# Patient Record
Sex: Male | Born: 1990
Health system: Southern US, Community
[De-identification: ages and names within clinical notes are randomized; demographics above are authoritative.]

## PROBLEM LIST (undated history)

## (undated) DIAGNOSIS — F32A Depression, unspecified: Secondary | ICD-10-CM

## (undated) DIAGNOSIS — F329 Major depressive disorder, single episode, unspecified: Secondary | ICD-10-CM

## (undated) DIAGNOSIS — F988 Other specified behavioral and emotional disorders with onset usually occurring in childhood and adolescence: Secondary | ICD-10-CM

## (undated) DIAGNOSIS — R066 Hiccough: Secondary | ICD-10-CM

## (undated) DIAGNOSIS — K5792 Diverticulitis of intestine, part unspecified, without perforation or abscess without bleeding: Secondary | ICD-10-CM

## (undated) DIAGNOSIS — F419 Anxiety disorder, unspecified: Secondary | ICD-10-CM

## (undated) DIAGNOSIS — T401X1A Poisoning by heroin, accidental (unintentional), initial encounter: Secondary | ICD-10-CM

## (undated) HISTORY — PX: OTHER SURGICAL HISTORY: SHX169

## (undated) HISTORY — DX: Poisoning by heroin, accidental (unintentional), initial encounter: T40.1X1A

---

## 2009-01-30 ENCOUNTER — Encounter: Payer: Self-pay | Admitting: Internal Medicine

## 2009-02-05 ENCOUNTER — Encounter: Payer: Self-pay | Admitting: Internal Medicine

## 2009-02-05 ENCOUNTER — Telehealth: Payer: Self-pay | Admitting: Internal Medicine

## 2009-02-09 ENCOUNTER — Ambulatory Visit: Payer: Self-pay | Admitting: Internal Medicine

## 2009-02-09 DIAGNOSIS — R198 Other specified symptoms and signs involving the digestive system and abdomen: Secondary | ICD-10-CM | POA: Insufficient documentation

## 2009-02-09 DIAGNOSIS — R112 Nausea with vomiting, unspecified: Secondary | ICD-10-CM | POA: Insufficient documentation

## 2011-02-26 ENCOUNTER — Ambulatory Visit (HOSPITAL_COMMUNITY)
Admission: RE | Admit: 2011-02-26 | Discharge: 2011-02-26 | Disposition: A | Discharge status: Home / Self Care | Payer: BC Managed Care – PPO | Source: Ambulatory Visit | Attending: Family Medicine | Admitting: Family Medicine

## 2011-02-26 ENCOUNTER — Inpatient Hospital Stay (HOSPITAL_COMMUNITY)
Admission: EM | Admit: 2011-02-26 | Discharge: 2011-03-02 | DRG: 183 | Disposition: A | Discharge status: Home / Self Care | Payer: BC Managed Care – PPO | Attending: Internal Medicine | Admitting: Internal Medicine

## 2011-02-26 ENCOUNTER — Other Ambulatory Visit: Payer: Self-pay | Admitting: Family Medicine

## 2011-02-26 DIAGNOSIS — F909 Attention-deficit hyperactivity disorder, unspecified type: Secondary | ICD-10-CM | POA: Diagnosis present

## 2011-02-26 DIAGNOSIS — R109 Unspecified abdominal pain: Secondary | ICD-10-CM | POA: Diagnosis present

## 2011-02-26 DIAGNOSIS — R52 Pain, unspecified: Secondary | ICD-10-CM

## 2011-02-26 DIAGNOSIS — K5732 Diverticulitis of large intestine without perforation or abscess without bleeding: Principal | ICD-10-CM | POA: Diagnosis present

## 2011-02-26 DIAGNOSIS — K7689 Other specified diseases of liver: Secondary | ICD-10-CM | POA: Insufficient documentation

## 2011-02-26 DIAGNOSIS — R509 Fever, unspecified: Secondary | ICD-10-CM | POA: Diagnosis present

## 2011-02-26 DIAGNOSIS — R110 Nausea: Secondary | ICD-10-CM | POA: Insufficient documentation

## 2011-02-26 DIAGNOSIS — N200 Calculus of kidney: Secondary | ICD-10-CM | POA: Insufficient documentation

## 2011-02-26 LAB — URINALYSIS, ROUTINE W REFLEX MICROSCOPIC
Glucose, UA: NEGATIVE mg/dL
Leukocytes, UA: NEGATIVE
Nitrite: NEGATIVE
pH: 7 (ref 5.0–8.0)

## 2011-02-26 LAB — CBC
MCH: 30.5 pg (ref 26.0–34.0)
MCV: 86.8 fL (ref 78.0–100.0)
Platelets: 198 10*3/uL (ref 150–400)
RDW: 13.6 % (ref 11.5–15.5)

## 2011-02-26 LAB — BASIC METABOLIC PANEL
BUN: 9 mg/dL (ref 6–23)
CO2: 28 mEq/L (ref 19–32)
Calcium: 9.4 mg/dL (ref 8.4–10.5)
Creatinine, Ser: 0.72 mg/dL (ref 0.50–1.35)
GFR calc Af Amer: >90 mL/min (ref >90)

## 2011-02-26 LAB — DIFFERENTIAL
Basophils Absolute: 0 10*3/uL (ref 0.0–0.1)
Eosinophils Absolute: 0 10*3/uL (ref 0.0–0.7)
Lymphocytes Relative: 13 % (ref 12–46)
Lymphs Abs: 1.5 10*3/uL (ref 0.7–4.0)
Neutrophils Relative %: 78 % — ABNORMAL HIGH (ref 43–77)

## 2011-02-26 MED ORDER — IOHEXOL 300 MG/ML  SOLN
125.0000 mL | Freq: Once | INTRAMUSCULAR | Status: AC | PRN
  Administered 2011-02-26: 125 mL via INTRAVENOUS

## 2011-02-27 LAB — CBC
MCHC: 34.2 g/dL (ref 30.0–36.0)
MCV: 88.5 fL (ref 78.0–100.0)
Platelets: 181 10*3/uL (ref 150–400)
RDW: 13.5 % (ref 11.5–15.5)
WBC: 8.2 10*3/uL (ref 4.0–10.5)

## 2011-02-27 LAB — BASIC METABOLIC PANEL
Chloride: 100 mEq/L (ref 96–112)
Creatinine, Ser: 0.76 mg/dL (ref 0.50–1.35)
GFR calc Af Amer: >90 mL/min (ref >90)
GFR calc non Af Amer: >90 mL/min (ref >90)

## 2011-02-28 LAB — COMPREHENSIVE METABOLIC PANEL
ALT: 18 U/L (ref 0–53)
AST: 14 U/L (ref 0–37)
Albumin: 3.4 g/dL — ABNORMAL LOW (ref 3.5–5.2)
CO2: 28 mEq/L (ref 19–32)
Chloride: 99 mEq/L (ref 96–112)
GFR calc non Af Amer: >90 mL/min (ref >90)
Potassium: 3.6 mEq/L (ref 3.5–5.1)
Sodium: 136 mEq/L (ref 135–145)
Total Bilirubin: 0.7 mg/dL (ref 0.3–1.2)

## 2011-02-28 LAB — LIPASE, BLOOD: Lipase: 17 U/L (ref 11–59)

## 2011-02-28 LAB — DIFFERENTIAL
Basophils Absolute: 0 10*3/uL (ref 0.0–0.1)
Basophils Relative: 0 % (ref 0–1)
Eosinophils Absolute: 0.1 10*3/uL (ref 0.0–0.7)
Eosinophils Relative: 2 % (ref 0–5)
Lymphs Abs: 1.5 10*3/uL (ref 0.7–4.0)
Neutrophils Relative %: 64 % (ref 43–77)

## 2011-02-28 LAB — CBC
Platelets: 210 10*3/uL (ref 150–400)
RBC: 4.8 MIL/uL (ref 4.22–5.81)
RDW: 13.4 % (ref 11.5–15.5)
WBC: 6.4 10*3/uL (ref 4.0–10.5)

## 2011-03-01 LAB — COMPREHENSIVE METABOLIC PANEL
Alkaline Phosphatase: 67 U/L (ref 39–117)
BUN: 7 mg/dL (ref 6–23)
CO2: 26 mEq/L (ref 19–32)
Calcium: 9.7 mg/dL (ref 8.4–10.5)
Chloride: 100 mEq/L (ref 96–112)
Creatinine, Ser: 0.77 mg/dL (ref 0.50–1.35)
GFR calc Af Amer: >90 mL/min (ref >90)

## 2011-03-01 LAB — DIFFERENTIAL
Eosinophils Absolute: 0.1 10*3/uL (ref 0.0–0.7)
Eosinophils Relative: 3 % (ref 0–5)
Lymphocytes Relative: 27 % (ref 12–46)
Lymphs Abs: 1.4 10*3/uL (ref 0.7–4.0)
Monocytes Relative: 10 % (ref 3–12)

## 2011-03-01 LAB — CBC
HCT: 42.3 % (ref 39.0–52.0)
MCH: 29.8 pg (ref 26.0–34.0)
MCV: 87.4 fL (ref 78.0–100.0)
Platelets: 259 10*3/uL (ref 150–400)
RBC: 4.84 MIL/uL (ref 4.22–5.81)
RDW: 13.1 % (ref 11.5–15.5)
WBC: 5.2 10*3/uL (ref 4.0–10.5)

## 2011-03-02 NOTE — Discharge Summary (Signed)
NAMEHASKELL, [name] NO.:  [id number]  MEDICAL RECORD NO.:  [id number]  LOCATION:  1526                         FACILITY:  [facility]  PHYSICIAN:  Talmage Nap, MD  DATE OF BIRTH:  [birth year]  DATE OF ADMISSION:  02/26/2011 DATE OF DISCHARGE:  03/02/2011                        DISCHARGE SUMMARY - REFERRING   PRIMARY CARE PHYSICIAN:  Kari Baars, M.D.  DISCHARGE DIAGNOSES: 1. Proximal descending colon diverticulitis. 2. Attention deficit hyperactivity disorder.  HISTORY:  The patient is a 20 year old very pleasant Caucasian male with history of ADHD, was admitted to the hospital on February 26, 2011 by Dr. Trula Ore Rama with abdominal pain mainly located in the left lower quadrant as well as in the left flank.  There was no associated nausea, vomiting, or diarrhea.  The patient was said to have had low-grade fever and chills.  Pain was said to have persisted and subsequently presented to the hospital to be admitted.  MEDICATIONS:  His preadmission medications include Adderall 30 mg p.o. daily.  ALLERGIES:  KEFLEX.  PAST SURGICAL HISTORY:  None.  FAMILY HISTORY:  Parents are alive.  Maternal aunt is said to have a history of diverticulosis.  The patient is single and lives with his parents.  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  REVIEW OF SYSTEMS:  Essentially documented in the initial history and physical.  PHYSICAL EXAMINATION:  GENERAL:  At the time the patient was seen by the admitting physician, he was not in any distress, obese. VITAL SIGNS:  Temperature is 98.4, pulse 91, respiratory rate 17, blood pressure is 132/67, and saturation 98% on room air. HEENT:  Pupils are reactive to light and extraocular muscles are intact. He was said to have slight coating of the tongue. NECK:  No jugular venous distention.  No carotid bruit.  No lymphadenopathy. CHEST:  Clear to auscultation with diminished breath sounds bibasilarly. No  adventitious sounds. HEART:  Sounds are 1 and 2. ABDOMEN:  Soft with tenderness in the left lower quadrant.  No guarding, no rigidity.  Liver, spleen, and kidney not palpable.  Bowel sounds are hypoactive. EXTREMITIES:  No pedal edema. NEUROLOGIC:  Nonfocal. MUSCULOSKELETAL SYSTEM:  Unremarkable. SKIN:  Showed decreased turgor.  LABORATORY DATA:  Urinalysis done on the patient, unremarkable. Complete blood count with differential showed WBC of 13.6, hemoglobin of 14.3, hematocrit of 40.7, MCV of 86.8, platelet count of 198, neutrophils 78%, and absolute neutrophil count is 8.8.  Basic metabolic panel showed sodium of 134, potassium of 3.6, chloride of 96 with a bicarbonate of 28, glucose is 84, BUN is 9, creatinine 0.72, and lipase 17.  A repeat complete blood count with differential done on March 01, 2011, showed WBC of 5.2, hemoglobin of 14.4, hematocrit of 42.3, MCV of 87.4, platelet count of 259, and normal differential.  Comprehensive metabolic panel showed sodium of 136, potassium of 3.8, chloride of 100 with a bicarbonate of 26, glucose is 90, BUN is 7, creatinine is 0.77, lipase is 20, and magnesium level is 2.2.  IMAGING STUDIES:  CT of the abdomen and pelvis with contrast and it showed segmental wall thickening of the proximal descending colon with significant pericolic inflammatory changes  and a few foci of extraluminal gas that representing perforated diverticulitis or potentially segmental colitis with perforation.  There is no abscess or intraperitoneal air seen.  HOSPITAL COURSE:  The patient was admitted to general medical floor.  He was made n.p.o., started on normal saline with 20 mEq of KCl to go at rate of 100 cc an hour, and SCD boot for DVT prophylaxis.  He was given Zofran for nausea and Mylanta for dyspepsia.  Pain control was done with oxycodone as well as morphine IV 2 mg to 4 mg q.4 h. p.r.n.  The patient was however seen by me for the very first time  in this admission on February 27, 2011, and during my encounter, the patient was made completely n.p.o. except ice chips.  He was given D5 half-normal saline IV to go at rate of 100 cc an hour and p.o. Flagyl and he was changed from p.o. Flagyl to IV Flagyl 500 mg q.8 h. and also Cipro 400 mg IV q.12 h.  He was subsequently evaluated on daily basis by Dr. Vassie Loll.  The patient was seen by me again today, which is March 02, 2011, feels better.  No abdominal pain.  No nausea or vomiting.  No fever.  No chills.  No rigor.  Tolerating p.o. feeds.  Examination of the patient was essentially unremarkable.  Vital signs, blood pressure is 119/76, temperature is 97.5 pulse 60, respiratory rate 20, and medically stable.  Plan is for the patient to be discharged home today and activity as tolerated, high-fiber diet.  Follow up with his primary care physician in 1 to 2 weeks and PCP to arrange for colonoscopy in 4 weeks.  Medications to be taken at home will include, 1. Cipro 500 mg p.o. b.i.d. for 7 days. 2. Flagyl 500 mg p.o. t.i.d. for 7 days. 3. He is to continue with his Adderall XR 30 mg p.o. daily.     Talmage Nap, MD     CN/MEDQ  D:  03/02/2011  T:  03/02/2011  Job:  [id number]  cc:   Kari Baars, M.D. Fax: [phone]  Electronically Signed by Talmage Nap  on 03/02/2011 06:58:12 PM

## 2011-03-09 NOTE — H&P (Addendum)
[name], [name] NO.:  [id number]  MEDICAL RECORD NO.:  [id number]  LOCATION:  XRAY                         FACILITY:  [facility]  PHYSICIAN:  Hillery Aldo, M.D.   DATE OF BIRTH:  Dec 08, 1990  DATE OF ADMISSION:  02/26/2011 DATE OF DISCHARGE:                             HISTORY & PHYSICAL   PRIMARY CARE PHYSICIAN:  Dr. Buren Kos  CHIEF COMPLAINT:  Abdominal pain.  HISTORY OF PRESENT ILLNESS:  The patient is a 20 year old male with no significant past medical history, who presents to the hospital with a chief complaint of worsening left lower quadrant and left flank pain. There has not been any associated nausea, vomiting, or diarrhea.  He has had a diminished appetite today, but otherwise his appetite has been normal.  [facility], where he underwent a CT scan of his abdomen and pelvis and this showed colitis with a microperforation.  He subsequently was instructed to come to the emergency department for further evaluation.  He was given a dose of Cipro and Flagyl, and ultimately referred to the hospitalist service for further evaluation.  The patient has had some low-grade fever and chills today.  His last bowel movement was normal today and was without melena or hematochezia.  PAST MEDICAL HISTORY:  ADHD.  PAST SURGICAL HISTORY:  None.  FAMILY HISTORY:  Parents are alive in their 49s and healthy.  The patient's maternal aunt might have had a history of diverticulosis, but this is not ascertained.  SOCIAL HISTORY:  The patient is single and lives with his parents.  He is a nonsmoker and does not drink alcohol or use drugs.  He works for an Production designer, theatre/television/film.  ALLERGIES:  KEFLEX.  CURRENT MEDICATIONS:  Adderall 30 mg XR p.o. daily.  REVIEW OF SYSTEMS:  Comprehensive 14-point review of systems is unremarkable except as noted in the elements of the HPI above.  The only pertinent positive was he does complain of some  shortness of breath that is secondary to inability to inspire deeply because of abdominal pain.  PHYSICAL EXAMINATION:  GENERAL:  Mildly obese male who is in no acute distress. VITAL SIGNS:  Temperature 98.4, pulse 91, respirations 17, blood pressure 132/67, and O2 saturation 98% on room air. HEENT:  Normocephalic, atraumatic.  PERRL.  EOMI.  Oropharynx is clear with a slight white coating to the tongue. NECK:  Supple, no thyromegaly, no lymphadenopathy, no jugular venous distention. CHEST:  Lungs, clear to auscultation bilaterally, diminished at the bases. HEART:  Regular rate, rhythm.  No murmurs, rubs, or gallops. ABDOMEN:  Soft.  Tender to the left lower quadrant.  Diminished bowel sounds.  No guarding. EXTREMITIES:  No clubbing, edema, or cyanosis. SKIN:  Warm and dry.  No rashes. NEUROLOGIC:  The patient is alert and oriented x3.  Cranial nerves II through XII are grossly intact.  Nonfocal.  IMAGING:  CT scan of the abdomen and pelvis shows segmental wall thickening of the proximal descending colon with significant pericolic inflammatory changes as well as a few foci of extraluminal gas, either representing perforated diverticulitis or potentially segmental colitis with perforation.  No evidence of abscess or free intraperitoneal air/fluid.  Tiny bilateral nonobstructing renal calculi.  LABORATORY DATA:  Urinalysis is negative for nitrites and leukocytes with a specific gravity of 1.046.  White blood cell count is 11.3, hemoglobin 14.3, hematocrit 40.7, platelets 198 with an absolute neutrophil count of 8.8.  Sodium is 134, potassium 3.6, chloride 96, bicarbonate 28, BUN 9, creatinine 0.72, glucose 84, calcium 9.4.  ASSESSMENT AND PLAN: 1. Colitis with microperforation secondary to diverticulitis versus     segmental colitis:  We will admit the patient and initiate IV     antibiotics with Cipro and Flagyl.  The ER physician did speak with     Dr. Luretha Murphy of Surgery  who recommended admission to the     medical service with IV antibiotics as there was no immediate need     for surgical intervention.  We will place the patient on a clear     liquid diet for now and provide him with analgesics and antiemetics     as needed. 2. Nonobstructing renal calculi:  The patient's urine specific gravity     is concentrated suggesting he dehydrated.  We would hydrate him. 3. Mild hyponatremia:  Again, likely secondary to dehydration.  We     will hydrate him. 4. History of attention deficit hyperactivity disorder.  We will hold     the patient's Adderall for now. 5. Prophylaxis:  We will initiate PAS hoses for deep vein thrombosis     prophylaxis.  Time spent on admission including face-to-face time equals approximately 45 minutes.     Hillery Aldo, M.D.     CR/MEDQ  D:  02/26/2011  T:  02/26/2011  Job:  [id number]  cc:   Dr. Buren Kos  Electronically Signed by Hillery Aldo M.D. on 03/09/2011 06:09:26 PM

## 2013-08-22 ENCOUNTER — Encounter (HOSPITAL_COMMUNITY): Payer: Self-pay | Admitting: Emergency Medicine

## 2013-08-22 ENCOUNTER — Emergency Department (HOSPITAL_COMMUNITY)
Admission: EM | Admit: 2013-08-22 | Discharge: 2013-08-23 | Disposition: A | Discharge status: Home / Self Care | Payer: 59 | Attending: Emergency Medicine | Admitting: Emergency Medicine

## 2013-08-22 ENCOUNTER — Emergency Department (HOSPITAL_COMMUNITY): Payer: 59

## 2013-08-22 DIAGNOSIS — K5732 Diverticulitis of large intestine without perforation or abscess without bleeding: Secondary | ICD-10-CM | POA: Insufficient documentation

## 2013-08-22 DIAGNOSIS — K921 Melena: Secondary | ICD-10-CM | POA: Insufficient documentation

## 2013-08-22 DIAGNOSIS — K5792 Diverticulitis of intestine, part unspecified, without perforation or abscess without bleeding: Secondary | ICD-10-CM

## 2013-08-22 DIAGNOSIS — Z79899 Other long term (current) drug therapy: Secondary | ICD-10-CM | POA: Insufficient documentation

## 2013-08-22 DIAGNOSIS — Z792 Long term (current) use of antibiotics: Secondary | ICD-10-CM | POA: Insufficient documentation

## 2013-08-22 HISTORY — DX: Diverticulitis of intestine, part unspecified, without perforation or abscess without bleeding: K57.92

## 2013-08-22 LAB — CBC WITH DIFFERENTIAL/PLATELET
BASOS ABS: 0 10*3/uL (ref 0.0–0.1)
BASOS PCT: 0 % (ref 0–1)
Eosinophils Absolute: 0.1 10*3/uL (ref 0.0–0.7)
Eosinophils Relative: 1 % (ref 0–5)
HCT: 42.8 % (ref 39.0–52.0)
Hemoglobin: 15.3 g/dL (ref 13.0–17.0)
LYMPHS PCT: 22 % (ref 12–46)
Lymphs Abs: 2.3 10*3/uL (ref 0.7–4.0)
MCH: 29.6 pg (ref 26.0–34.0)
MCHC: 35.7 g/dL (ref 30.0–36.0)
MCV: 82.8 fL (ref 78.0–100.0)
MONO ABS: 0.6 10*3/uL (ref 0.1–1.0)
Monocytes Relative: 6 % (ref 3–12)
NEUTROS ABS: 7.2 10*3/uL (ref 1.7–7.7)
NEUTROS PCT: 70 % (ref 43–77)
PLATELETS: 273 10*3/uL (ref 150–400)
RBC: 5.17 MIL/uL (ref 4.22–5.81)
RDW: 12.7 % (ref 11.5–15.5)
WBC: 10.4 10*3/uL (ref 4.0–10.5)

## 2013-08-22 LAB — BASIC METABOLIC PANEL
BUN: 10 mg/dL (ref 6–23)
CHLORIDE: 97 meq/L (ref 96–112)
CO2: 24 meq/L (ref 19–32)
CREATININE: 1.12 mg/dL (ref 0.50–1.35)
Calcium: 10.1 mg/dL (ref 8.4–10.5)
GFR calc non Af Amer: >90 mL/min (ref >90)
Glucose, Bld: 112 mg/dL — ABNORMAL HIGH (ref 70–99)
POTASSIUM: 3.7 meq/L (ref 3.7–5.3)
SODIUM: 136 meq/L — AB (ref 137–147)

## 2013-08-22 LAB — URINALYSIS, ROUTINE W REFLEX MICROSCOPIC
Bilirubin Urine: NEGATIVE
Glucose, UA: NEGATIVE mg/dL
Hgb urine dipstick: NEGATIVE
KETONES UR: NEGATIVE mg/dL
LEUKOCYTES UA: NEGATIVE
NITRITE: NEGATIVE
PH: 6.5 (ref 5.0–8.0)
Protein, ur: NEGATIVE mg/dL
Specific Gravity, Urine: 1.026 (ref 1.005–1.030)
Urobilinogen, UA: 0.2 mg/dL (ref 0.0–1.0)

## 2013-08-22 LAB — POC OCCULT BLOOD, ED: Fecal Occult Bld: NEGATIVE

## 2013-08-22 MED ORDER — SODIUM CHLORIDE 0.9 % IV BOLUS (SEPSIS)
1000.0000 mL | Freq: Once | INTRAVENOUS | Status: AC
  Administered 2013-08-22: 1000 mL via INTRAVENOUS

## 2013-08-22 MED ORDER — ONDANSETRON HCL 4 MG/2ML IJ SOLN
4.0000 mg | Freq: Once | INTRAMUSCULAR | Status: AC
  Administered 2013-08-23: 4 mg via INTRAVENOUS
  Filled 2013-08-22: qty 2

## 2013-08-22 MED ORDER — IOHEXOL 300 MG/ML  SOLN
50.0000 mL | Freq: Once | INTRAMUSCULAR | Status: AC | PRN
  Administered 2013-08-22: 50 mL via ORAL

## 2013-08-22 MED ORDER — MORPHINE SULFATE 4 MG/ML IJ SOLN
4.0000 mg | Freq: Once | INTRAMUSCULAR | Status: AC
  Administered 2013-08-23: 4 mg via INTRAVENOUS
  Filled 2013-08-22: qty 1

## 2013-08-22 NOTE — ED Notes (Signed)
Pt presents with c/o left lower abdominal pain. Pt says he was seen last year for the same and diagnosed with diverticulitis. Pt says he did have diarrhea and vomiting three days ago but none since then. Pt denies any urinary symptoms.

## 2013-08-22 NOTE — ED Provider Notes (Signed)
CSN: [id number]     Arrival date & time 08/22/13  2125 History   First MD Initiated Contact with Patient 08/22/13 2214     Chief Complaint  Patient presents with  . Abdominal Pain     (Consider location/radiation/quality/duration/timing/severity/associated sxs/prior Treatment) The history is provided by the patient. No language interpreter was used.  [name] is a 23 year old male with past medical history of diverticulitis diagnosed back in 2012 presenting to the ED with left lower quadrant pain that started yesterday. Patient reports that the discomfort is described as a constant aching sensation with intermittent sharp shooting pain without radiation. Stated the pain worsens with motion. Reported that approximately 2 days ago he had an onset of nausea that has been continuous. Stated approximately 2 days ago had at least 5-6 episodes of emesis-NB/NB. Reported diarrhea approximately 2 days ago. Reported intermittent bright red blood in stools. Patient reports that the abdominal pain is very similar to what is diagnosed as diverticulitis back in 2012 for he was admitted for approximately 4-5 days. Denied fever, melena, chest pain, shortness of breath, difficulty breathing, dizziness, fainting, weakness. PCP Dr. Clelia Croft GI none - reported that when he was hospitalized back in 2012 regarding perforated colon from diverticulitis stated that he had an appointment for colonoscopy to be performed and scheduled GI appointment, but patient reported that he never followed up.   Past Medical History  Diagnosis Date  . Diverticulitis    History reviewed. No pertinent past surgical history. No family history on file. History  Substance Use Topics  . Smoking status: Never Smoker   . Smokeless tobacco: Current User  . Alcohol Use: Yes     Comment: occasionally     Review of Systems  Constitutional: Positive for chills. Negative for fever.  Respiratory: Negative for chest tightness and shortness  of breath.   Cardiovascular: Negative for chest pain.  Gastrointestinal: Positive for nausea, vomiting, abdominal pain, diarrhea and blood in stool. Negative for constipation and anal bleeding.  Genitourinary: Negative for dysuria and hematuria.  Musculoskeletal: Negative for back pain and neck pain.  Neurological: Negative for dizziness and weakness.  All other systems reviewed and are negative.     Allergies  Cephalexin  Home Medications   Current Outpatient Rx  Name  Route  Sig  Dispense  Refill  . amphetamine-dextroamphetamine (ADDERALL XR) 25 MG 24 hr capsule   Oral   Take 25 mg by mouth every morning.         . ciprofloxacin (CIPRO) 500 MG tablet   Oral   Take 1 tablet (500 mg total) by mouth 2 (two) times daily. One po bid x 7 days   14 tablet   0   . metroNIDAZOLE (FLAGYL) 500 MG tablet   Oral   Take 1 tablet (500 mg total) by mouth 2 (two) times daily. One po bid x 7 days   14 tablet   0   . ondansetron (ZOFRAN ODT) 8 MG disintegrating tablet      8mg  ODT q4 hours prn nausea   20 tablet   0   . oxyCODONE-acetaminophen (PERCOCET/ROXICET) 5-325 MG per tablet   Oral   Take 1-2 tablets by mouth every 6 (six) hours as needed for severe pain.   20 tablet   0    BP 108/60  Pulse 78  Temp(Src) 97.8 F (36.6 C) (Oral)  Resp 18  SpO2 98% Physical Exam  Nursing note and vitals reviewed. Constitutional: He is oriented to person,  place, and time. He appears well-developed and well-nourished. No distress.  HENT:  Head: Normocephalic and atraumatic.  Mouth/Throat: Oropharynx is clear and moist. No oropharyngeal exudate.  Eyes: Conjunctivae and EOM are normal. Pupils are equal, round, and reactive to light. Right eye exhibits no discharge. Left eye exhibits no discharge.  Neck: Normal range of motion. Neck supple. No tracheal deviation present.  Cardiovascular: Normal rate, regular rhythm and normal heart sounds.  Exam reveals no friction rub.   No murmur  heard. Pulses:      Radial pulses are 2+ on the right side, and 2+ on the left side.       Dorsalis pedis pulses are 2+ on the right side, and 2+ on the left side.  Pulmonary/Chest: Effort normal and breath sounds normal. No respiratory distress. He has no wheezes. He has no rales.  Abdominal: Soft. Normal appearance and bowel sounds are normal. There is tenderness in the left lower quadrant. There is guarding.    Obese Discomfort upon palpation to the left lower quadrant  Genitourinary:  Rectal exam: Negative swelling, erythema, inflammation, lesions, fissures, hemorrhoids noted to the anus. Good sphincter tone. Negative pain upon palpation to the rectum-negative palpation of polyps or masses. Negative blood on glove. Brown stools on glove. Exam chaperoned with nurse  Musculoskeletal: Normal range of motion.  Full ROM to upper and lower extremities without difficulty noted, negative ataxia noted.  Lymphadenopathy:    He has no cervical adenopathy.  Neurological: He is alert and oriented to person, place, and time. No cranial nerve deficit. He exhibits normal muscle tone. Coordination normal.  Skin: Skin is warm and dry. No rash noted. He is not diaphoretic. No erythema.  Psychiatric: He has a normal mood and affect. His behavior is normal. Thought content normal.    ED Course  Procedures (including critical care time)  Results for orders placed during the hospital encounter of 08/22/13  CBC WITH DIFFERENTIAL      Result Value Ref Range   WBC 10.4  4.0 - 10.5 K/uL   RBC 5.17  4.22 - 5.81 MIL/uL   Hemoglobin 15.3  13.0 - 17.0 g/dL   HCT 16.1  09.6 - 04.5 %   MCV 82.8  78.0 - 100.0 fL   MCH 29.6  26.0 - 34.0 pg   MCHC 35.7  30.0 - 36.0 g/dL   RDW 40.9  81.1 - 91.4 %   Platelets 273  150 - 400 K/uL   Neutrophils Relative % 70  43 - 77 %   Neutro Abs 7.2  1.7 - 7.7 K/uL   Lymphocytes Relative 22  12 - 46 %   Lymphs Abs 2.3  0.7 - 4.0 K/uL   Monocytes Relative 6  3 - 12 %    Monocytes Absolute 0.6  0.1 - 1.0 K/uL   Eosinophils Relative 1  0 - 5 %   Eosinophils Absolute 0.1  0.0 - 0.7 K/uL   Basophils Relative 0  0 - 1 %   Basophils Absolute 0.0  0.0 - 0.1 K/uL  BASIC METABOLIC PANEL      Result Value Ref Range   Sodium 136 (*) 137 - 147 mEq/L   Potassium 3.7  3.7 - 5.3 mEq/L   Chloride 97  96 - 112 mEq/L   CO2 24  19 - 32 mEq/L   Glucose, Bld 112 (*) 70 - 99 mg/dL   BUN 10  6 - 23 mg/dL   Creatinine, Ser 7.82  0.50 -  1.35 mg/dL   Calcium 81.110.1  8.4 - 91.410.5 mg/dL   GFR calc non Af Amer >90  >90 mL/min   GFR calc Af Amer >90  >90 mL/min  URINALYSIS, ROUTINE W REFLEX MICROSCOPIC      Result Value Ref Range   Color, Urine AMBER (*) YELLOW   APPearance CLEAR  CLEAR   Specific Gravity, Urine 1.026  1.005 - 1.030   pH 6.5  5.0 - 8.0   Glucose, UA NEGATIVE  NEGATIVE mg/dL   Hgb urine dipstick NEGATIVE  NEGATIVE   Bilirubin Urine NEGATIVE  NEGATIVE   Ketones, ur NEGATIVE  NEGATIVE mg/dL   Protein, ur NEGATIVE  NEGATIVE mg/dL   Urobilinogen, UA 0.2  0.0 - 1.0 mg/dL   Nitrite NEGATIVE  NEGATIVE   Leukocytes, UA NEGATIVE  NEGATIVE  LIPASE, BLOOD      Result Value Ref Range   Lipase 19  11 - 59 U/L  POC OCCULT BLOOD, ED      Result Value Ref Range   Fecal Occult Bld NEGATIVE  NEGATIVE    Labs Review Labs Reviewed  BASIC METABOLIC PANEL - Abnormal; Notable for the following:    Sodium 136 (*)    Glucose, Bld 112 (*)    All other components within normal limits  URINALYSIS, ROUTINE W REFLEX MICROSCOPIC - Abnormal; Notable for the following:    Color, Urine AMBER (*)    All other components within normal limits  CBC WITH DIFFERENTIAL  LIPASE, BLOOD  POC OCCULT BLOOD, ED   Imaging Review Ct Abdomen Pelvis W Contrast  08/23/2013   CLINICAL DATA:  Abdominal pain left lower quadrant with nausea, vomiting and diarrhea 3 days. History of previous diverticulitis.  EXAM: CT ABDOMEN AND PELVIS WITH CONTRAST  TECHNIQUE: Multidetector CT imaging of the abdomen  and pelvis was performed using the standard protocol following bolus administration of intravenous contrast.  CONTRAST:  50mL OMNIPAQUE IOHEXOL 300 MG/ML SOLN, 100mL OMNIPAQUE IOHEXOL 300 MG/ML SOLN  COMPARISON:  02/26/2011  FINDINGS: Lung bases demonstrate very minimal dependent atelectatic change.  Abdominal images demonstrate a normal liver, spleen, pancreas and gallbladder. Adrenal glands are within normal. Kidneys are normal in size without hydronephrosis or nephrolithiasis. Ureters are within normal. The appendix is normal.  There is mild diverticulosis of the colon. There is inflammatory change adjacent on a diverticular over the distal descending colon in the left lower quadrant and minimal adjacent free fluid. There is no evidence of perforation or abscess.  Pelvic images are unremarkable.  IMPRESSION: Mild short segment acute diverticulitis of the distal descending colon in the left lower quadrant. No evidence of perforation or abscess.   Electronically Signed   By: Elberta Fortisaniel  Boyle M.D.   On: 08/23/2013 01:18     EKG Interpretation None      MDM   Final diagnoses:  Diverticulitis   Medications  sodium chloride 0.9 % bolus 1,000 mL (0 mLs Intravenous Stopped 08/23/13 0047)  iohexol (OMNIPAQUE) 300 MG/ML solution 50 mL (50 mLs Oral Contrast Given 08/22/13 2332)  ondansetron (ZOFRAN) injection 4 mg (4 mg Intravenous Given 08/23/13 0012)  morphine 4 MG/ML injection 4 mg (4 mg Intravenous Given 08/23/13 0012)  iohexol (OMNIPAQUE) 300 MG/ML solution 100 mL (100 mLs Intravenous Contrast Given 08/23/13 0022)  ciprofloxacin (CIPRO) IVPB 400 mg (0 mg Intravenous Stopped 08/23/13 0348)  metroNIDAZOLE (FLAGYL) IVPB 500 mg (0 mg Intravenous Stopped 08/23/13 0509)  HYDROmorphone (DILAUDID) injection 1 mg (1 mg Intravenous Given 08/23/13 0210)  HYDROcodone-acetaminophen (NORCO/VICODIN)  5-325 MG per tablet 1 tablet (1 tablet Oral Given 08/23/13 0509)  HYDROmorphone (DILAUDID) injection 1 mg (1 mg Intravenous  Given 08/23/13 0619)  oxyCODONE-acetaminophen (PERCOCET/ROXICET) 5-325 MG per tablet 2 tablet (2 tablets Oral Given 08/23/13 0734)  ondansetron (ZOFRAN) injection 4 mg (4 mg Intravenous Given 08/23/13 0735)   Filed Vitals:   08/22/13 2150 08/23/13 0146 08/23/13 0527 08/23/13 0732  BP: 138/89 108/70 116/68 108/60  Pulse: 115 85 72 78  Temp: 98.5 F (36.9 C) 98.5 F (36.9 C)  97.8 F (36.6 C)  TempSrc: Oral Oral  Oral  Resp: 22 20 16 18   SpO2: 97% 100% 100% 98%    Patient presenting to the ED with left lower quadrant abdominal pain starting yesterday with associated nausea, vomiting, diarrhea and intermittent hematochezia. Reported that the pain is described as a constant aching sensation with intermittent sharp shooting pain without radiation. Patient reported that approximately 2 years ago had similar presentation and was diagnosed with diverticulitis be admitted to the hospital for approximately 4-5 days. This provider reviewed patient's chart. Patient was seen and assessed in ED setting back in October of 2012 for patients diagnosed with acute diverticulitis with perforation of the colon. Patient was admitted to the hospital. Alert and oriented. GCS 15. Heart rate and rhythm normal. Lungs clear to auscultation to upper and lower lobes bilaterally-good lung expansion-negative signs of respiratory distress. Radial and DP pulses 2+ bilaterally. Cap refill less than 3 seconds. Obese. Negative abdominal distention identified, negative fluid wave noted. Bowel sounds normal active in all 4 quadrants. Discomfort upon palpation to left lower quadrant. Negative Murphy's sign. Negative Murphy's point. CBC negative elevation white blood cell count-negative left shift or leukocytosis noted. BMP noted proper functioning kidneys-BUN 10, creatinine 1.12. Mildly low sodium of 136. Lipase negative elevation. Urinalysis negative for hemoglobin, nitrites, leukocytes-negative findings of infection. Fecal occult  negative. CT abdomen and pelvis with contrast noted mild short segment acute diverticulitis of the distal descending colon and the left lower quadrant with no evidence of perforation or abscess. Patient given IV fluids, IV pain medications and antibiotics. Patient started on IV Cipro and Flagyl while in ED setting. Discussed case with Ivonne Andrew, PA-C at change in shift-transfer of care to [facility], PA-C at change in shift. Plan is for IV fluids, IV antibiotics, pain control, fluid challenge-patient able to tolerate fluids by mouth without difficulty, patient stable patient to be discharged with GI followup as outpatient.   Raymon Mutton, PA-C 08/23/13 1116

## 2013-08-23 ENCOUNTER — Encounter (HOSPITAL_COMMUNITY): Payer: Self-pay

## 2013-08-23 LAB — LIPASE, BLOOD: LIPASE: 19 U/L (ref 11–59)

## 2013-08-23 MED ORDER — METRONIDAZOLE IN NACL 5-0.79 MG/ML-% IV SOLN
500.0000 mg | Freq: Once | INTRAVENOUS | Status: AC
  Administered 2013-08-23: 500 mg via INTRAVENOUS
  Filled 2013-08-23: qty 100

## 2013-08-23 MED ORDER — IOHEXOL 300 MG/ML  SOLN
100.0000 mL | Freq: Once | INTRAMUSCULAR | Status: AC | PRN
  Administered 2013-08-23: 100 mL via INTRAVENOUS

## 2013-08-23 MED ORDER — OXYCODONE-ACETAMINOPHEN 5-325 MG PO TABS
2.0000 | ORAL_TABLET | Freq: Once | ORAL | Status: AC
  Administered 2013-08-23: 2 via ORAL
  Filled 2013-08-23: qty 2

## 2013-08-23 MED ORDER — HYDROCODONE-ACETAMINOPHEN 5-325 MG PO TABS
1.0000 | ORAL_TABLET | Freq: Once | ORAL | Status: AC
  Administered 2013-08-23: 1 via ORAL
  Filled 2013-08-23: qty 1

## 2013-08-23 MED ORDER — METRONIDAZOLE 500 MG PO TABS: 500.0000 mg | ORAL_TABLET | Freq: Two times a day (BID) | ORAL | Status: DC

## 2013-08-23 MED ORDER — ONDANSETRON 8 MG PO TBDP: ORAL_TABLET | ORAL | Status: DC

## 2013-08-23 MED ORDER — HYDROMORPHONE HCL PF 1 MG/ML IJ SOLN
1.0000 mg | Freq: Once | INTRAMUSCULAR | Status: AC
  Administered 2013-08-23: 1 mg via INTRAVENOUS
  Filled 2013-08-23: qty 1

## 2013-08-23 MED ORDER — OXYCODONE-ACETAMINOPHEN 5-325 MG PO TABS: 1.0000 | ORAL_TABLET | Freq: Four times a day (QID) | ORAL | Status: DC | PRN

## 2013-08-23 MED ORDER — CIPROFLOXACIN IN D5W 400 MG/200ML IV SOLN
400.0000 mg | Freq: Once | INTRAVENOUS | Status: AC
  Administered 2013-08-23: 400 mg via INTRAVENOUS
  Filled 2013-08-23: qty 200

## 2013-08-23 MED ORDER — ONDANSETRON HCL 4 MG/2ML IJ SOLN
4.0000 mg | Freq: Once | INTRAMUSCULAR | Status: AC
  Administered 2013-08-23: 4 mg via INTRAVENOUS
  Filled 2013-08-23: qty 2

## 2013-08-23 MED ORDER — CIPROFLOXACIN HCL 500 MG PO TABS: 500.0000 mg | ORAL_TABLET | Freq: Two times a day (BID) | ORAL | Status: DC

## 2013-08-23 NOTE — Discharge Instructions (Signed)
You were found to have a diverticulitis infection.  Please take the antibiotics as prescribed and follow up with your primary care provider for continued evaluation and treatment.  Return for any changing or worsening symptoms.    Diverticulitis A diverticulum is a small pouch or sac on the colon. Diverticulosis is the presence of these diverticula on the colon. Diverticulitis is the irritation (inflammation) or infection of diverticula. CAUSES  The colon and its diverticula contain bacteria. If food particles block the tiny opening to a diverticulum, the bacteria inside can grow and cause an increase in pressure. This leads to infection and inflammation and is called diverticulitis. SYMPTOMS   Abdominal pain and tenderness. Usually, the pain is located on the left side of your abdomen. However, it could be located elsewhere.  Fever.  Bloating.  Feeling sick to your stomach (nausea).  Throwing up (vomiting).  Abnormal stools. DIAGNOSIS  Your caregiver will take a history and perform a physical exam. Since many things can cause abdominal pain, other tests may be necessary. Tests may include:  Blood tests.  Urine tests.  X-ray of the abdomen.  CT scan of the abdomen. Sometimes, surgery is needed to determine if diverticulitis or other conditions are causing your symptoms. TREATMENT  Most of the time, you can be treated without surgery. Treatment includes:  Resting the bowels by only having liquids for a few days. As you improve, you will need to eat a low-fiber diet.  Intravenous (IV) fluids if you are losing body fluids (dehydrated).  Antibiotic medicines that treat infections may be given.  Pain and nausea medicine, if needed.  Surgery if the inflamed diverticulum has burst. HOME CARE INSTRUCTIONS   Try a clear liquid diet (broth, tea, or water for as long as directed by your caregiver). You may then gradually begin a low-fiber diet as tolerated.  A low-fiber diet is a  diet with less than 10 grams of fiber. Choose the foods below to reduce fiber in the diet:  White breads, cereals, rice, and pasta.  Cooked fruits and vegetables or soft fresh fruits and vegetables without the skin.  Ground or well-cooked tender beef, ham, veal, lamb, pork, or poultry.  Eggs and seafood.  After your diverticulitis symptoms have improved, your caregiver may put you on a high-fiber diet. A high-fiber diet includes 14 grams of fiber for every 1000 calories consumed. For a standard 2000 calorie diet, you would need 28 grams of fiber. Follow these diet guidelines to help you increase the fiber in your diet. It is important to slowly increase the amount fiber in your diet to avoid gas, constipation, and bloating.  Choose whole-grain breads, cereals, pasta, and brown rice.  Choose fresh fruits and vegetables with the skin on. Do not overcook vegetables because the more vegetables are cooked, the more fiber is lost.  Choose more nuts, seeds, legumes, dried peas, beans, and lentils.  Look for food products that have greater than 3 grams of fiber per serving on the Nutrition Facts label.  Take all medicine as directed by your caregiver.  If your caregiver has given you a follow-up appointment, it is very important that you go. Not going could result in lasting (chronic) or permanent injury, pain, and disability. If there is any problem keeping the appointment, call to reschedule. SEEK MEDICAL CARE IF:   Your pain does not improve.  You have a hard time advancing your diet beyond clear liquids.  Your bowel movements do not return to normal. SEEK  IMMEDIATE MEDICAL CARE IF:   Your pain becomes worse.  You have an oral temperature above 102 F (38.9 C), not controlled by medicine.  You have repeated vomiting.  You have bloody or black, tarry stools.  Symptoms that brought you to your caregiver become worse or are not getting better. MAKE SURE YOU:   Understand these  instructions.  Will watch your condition.  Will get help right away if you are not doing well or get worse. Document Released: 02/09/2005 Document Revised: 07/25/2011 Document Reviewed: 06/07/2010 [facility] Patient Information 2014 [place], [place].

## 2013-08-23 NOTE — ED Provider Notes (Signed)
[name] 2:00AM Pt discussed in sign out.  Pt with past hx of diverticulitis and perforation presenting with LLQ pain, diarrhea, and vomiting x 3 days.  Pt found to have uncomplicated diverticulitis today.  Pain improved.  Afebrile.  HR improved.  Receiving IV antibiotics.  Plan to give PO challenge and if doing well, d/c home for out patient treatment.  3:20AM Pt continues to be doing well he has received Cipro.  Flagyl pending.     5:40 AM patient has tolerated by mouth fluids and by mouth Vicodin. Continues to complain of pain. He states pain is too uncomfortable he is worried about returning home. We'll plan to order one additional dose of IV pain medication. Patient vital signs unremarkable.    Angus SellerPeter S Alyna Stensland, PA-C 08/23/13 [id number]

## 2013-08-23 NOTE — ED Provider Notes (Signed)
Medical screening examination/treatment/procedure(s) were performed by non-physician practitioner and as supervising physician I was immediately available for consultation/collaboration.   EKG Interpretation None       Derwood KaplanAnkit Gautham Hewins, MD 08/23/13 [id number]

## 2013-08-23 NOTE — ED Provider Notes (Signed)
6:48 AM Patient signed out to me by Ivonne AndrewPeter Dammen, PA-C, at change of shift.  Patient with uncomplicated diverticulitis per CT, receiving antitbiotics, still having significant pain.  Plan is for pain control, reassessment, anticipate discharge home.  Pt has said he was not comfortable being discharged home with the level of pain he had, and concerned that the vicodin did nothing for his pain. Pt states the dilaudid has now helped his pain, that he is now 6/10.  We discussed his diagnosis and expectations regarding pain control and symptoms.  Will given 2 percocet, which is what he is being discharged home with.  Examination of the abdomen: soft, nondistended, TTP LLQ, no guarding, no rebound.  Abdomen is nonsurgical.  Doubt any perforation or worsening of his condition at this time.  Pt is comfortable appearing.  No active vomiting.    Patient also seen by Dr Rhunette CroftNanavati this morning after last dose of pain medication, states pt is ready for discharge.    Results for orders placed during the hospital encounter of 08/22/13  CBC WITH DIFFERENTIAL      Result Value Ref Range   WBC 10.4  4.0 - 10.5 K/uL   RBC 5.17  4.22 - 5.81 MIL/uL   Hemoglobin 15.3  13.0 - 17.0 g/dL   HCT 16.142.8  09.639.0 - 04.552.0 %   MCV 82.8  78.0 - 100.0 fL   MCH 29.6  26.0 - 34.0 pg   MCHC 35.7  30.0 - 36.0 g/dL   RDW 40.912.7  81.111.5 - 91.415.5 %   Platelets 273  150 - 400 K/uL   Neutrophils Relative % 70  43 - 77 %   Neutro Abs 7.2  1.7 - 7.7 K/uL   Lymphocytes Relative 22  12 - 46 %   Lymphs Abs 2.3  0.7 - 4.0 K/uL   Monocytes Relative 6  3 - 12 %   Monocytes Absolute 0.6  0.1 - 1.0 K/uL   Eosinophils Relative 1  0 - 5 %   Eosinophils Absolute 0.1  0.0 - 0.7 K/uL   Basophils Relative 0  0 - 1 %   Basophils Absolute 0.0  0.0 - 0.1 K/uL  BASIC METABOLIC PANEL      Result Value Ref Range   Sodium 136 (*) 137 - 147 mEq/L   Potassium 3.7  3.7 - 5.3 mEq/L   Chloride 97  96 - 112 mEq/L   CO2 24  19 - 32 mEq/L   Glucose, Bld 112 (*) 70 -  99 mg/dL   BUN 10  6 - 23 mg/dL   Creatinine, Ser 7.821.12  0.50 - 1.35 mg/dL   Calcium 95.610.1  8.4 - 21.310.5 mg/dL   GFR calc non Af Amer >90  >90 mL/min   GFR calc Af Amer >90  >90 mL/min  URINALYSIS, ROUTINE W REFLEX MICROSCOPIC      Result Value Ref Range   Color, Urine AMBER (*) YELLOW   APPearance CLEAR  CLEAR   Specific Gravity, Urine 1.026  1.005 - 1.030   pH 6.5  5.0 - 8.0   Glucose, UA NEGATIVE  NEGATIVE mg/dL   Hgb urine dipstick NEGATIVE  NEGATIVE   Bilirubin Urine NEGATIVE  NEGATIVE   Ketones, ur NEGATIVE  NEGATIVE mg/dL   Protein, ur NEGATIVE  NEGATIVE mg/dL   Urobilinogen, UA 0.2  0.0 - 1.0 mg/dL   Nitrite NEGATIVE  NEGATIVE   Leukocytes, UA NEGATIVE  NEGATIVE  LIPASE, BLOOD  Result Value Ref Range   Lipase 19  11 - 59 U/L  POC OCCULT BLOOD, ED      Result Value Ref Range   Fecal Occult Bld NEGATIVE  NEGATIVE   Ct Abdomen Pelvis W Contrast  08/23/2013   CLINICAL DATA:  Abdominal pain left lower quadrant with nausea, vomiting and diarrhea 3 days. History of previous diverticulitis.  EXAM: CT ABDOMEN AND PELVIS WITH CONTRAST  TECHNIQUE: Multidetector CT imaging of the abdomen and pelvis was performed using the standard protocol following bolus administration of intravenous contrast.  CONTRAST:  50mL OMNIPAQUE IOHEXOL 300 MG/ML SOLN, OMNIPAQUE IOHEXOL 300 MG/ML SOLN  COMPARISON:  02/26/2011  FINDINGS: Lung bases demonstrate very minimal dependent atelectatic change.  Abdominal images demonstrate a normal liver, spleen, pancreas and gallbladder. Adrenal glands are within normal. Kidneys are normal in size without hydronephrosis or nephrolithiasis. Ureters are within normal. The appendix is normal.  There is mild diverticulosis of the colon. There is inflammatory change adjacent on a diverticular over the distal descending colon in the left lower quadrant and minimal adjacent free fluid. There is no evidence of perforation or abscess.  Pelvic images are unremarkable.   IMPRESSION: Mild short segment acute diverticulitis of the distal descending colon in the left lower quadrant. No evidence of perforation or abscess.   Electronically Signed   By: Elberta Fortis M.D.   On: 08/23/2013 01:18      Trixie Dredge, PA-C 08/23/13 [phone]

## 2013-08-31 NOTE — ED Provider Notes (Signed)
Shared service with midlevel provider. I have personally seen and examined the patient, providing direct face to face care, presenting with the chief complaint of abd pain. Physical exam findings include LLQ pain, ct confirms divertivulitis. Plan will be pain control, and antibiotics. I have reviewed the nursing documentation on past medical history, family history, and social history.   Derwood KaplanAnkit Bradie Sangiovanni, MD 08/31/13 [phone]

## 2013-12-06 ENCOUNTER — Emergency Department (HOSPITAL_COMMUNITY): Payer: 59

## 2013-12-06 ENCOUNTER — Inpatient Hospital Stay (HOSPITAL_COMMUNITY)
Admission: EM | Admit: 2013-12-06 | Discharge: 2013-12-09 | DRG: 392 | Disposition: A | Discharge status: Home / Self Care | Payer: 59 | Attending: Internal Medicine | Admitting: Internal Medicine

## 2013-12-06 ENCOUNTER — Encounter (HOSPITAL_COMMUNITY): Payer: Self-pay | Admitting: Emergency Medicine

## 2013-12-06 DIAGNOSIS — K5732 Diverticulitis of large intestine without perforation or abscess without bleeding: Principal | ICD-10-CM | POA: Diagnosis present

## 2013-12-06 DIAGNOSIS — R1032 Left lower quadrant pain: Secondary | ICD-10-CM | POA: Diagnosis not present

## 2013-12-06 DIAGNOSIS — K5900 Constipation, unspecified: Secondary | ICD-10-CM | POA: Diagnosis not present

## 2013-12-06 DIAGNOSIS — Z6833 Body mass index (BMI) 33.0-33.9, adult: Secondary | ICD-10-CM

## 2013-12-06 DIAGNOSIS — E669 Obesity, unspecified: Secondary | ICD-10-CM | POA: Diagnosis present

## 2013-12-06 DIAGNOSIS — K5792 Diverticulitis of intestine, part unspecified, without perforation or abscess without bleeding: Secondary | ICD-10-CM | POA: Insufficient documentation

## 2013-12-06 DIAGNOSIS — F988 Other specified behavioral and emotional disorders with onset usually occurring in childhood and adolescence: Secondary | ICD-10-CM | POA: Diagnosis present

## 2013-12-06 DIAGNOSIS — Z79899 Other long term (current) drug therapy: Secondary | ICD-10-CM | POA: Diagnosis not present

## 2013-12-06 LAB — CBC WITH DIFFERENTIAL/PLATELET
BASOS ABS: 0 10*3/uL (ref 0.0–0.1)
BASOS PCT: 0 % (ref 0–1)
Eosinophils Absolute: 0.1 10*3/uL (ref 0.0–0.7)
Eosinophils Relative: 1 % (ref 0–5)
HEMATOCRIT: 43.2 % (ref 39.0–52.0)
Hemoglobin: 14.8 g/dL (ref 13.0–17.0)
LYMPHS PCT: 15 % (ref 12–46)
Lymphs Abs: 1.3 10*3/uL (ref 0.7–4.0)
MCH: 29.4 pg (ref 26.0–34.0)
MCHC: 34.3 g/dL (ref 30.0–36.0)
MCV: 85.7 fL (ref 78.0–100.0)
Monocytes Absolute: 0.6 10*3/uL (ref 0.1–1.0)
Monocytes Relative: 7 % (ref 3–12)
NEUTROS ABS: 6.6 10*3/uL (ref 1.7–7.7)
NEUTROS PCT: 77 % (ref 43–77)
Platelets: 210 10*3/uL (ref 150–400)
RBC: 5.04 MIL/uL (ref 4.22–5.81)
RDW: 13.5 % (ref 11.5–15.5)
WBC: 8.7 10*3/uL (ref 4.0–10.5)

## 2013-12-06 LAB — BASIC METABOLIC PANEL
Anion gap: 18 — ABNORMAL HIGH (ref 5–15)
BUN: 14 mg/dL (ref 6–23)
CHLORIDE: 101 meq/L (ref 96–112)
CO2: 23 meq/L (ref 19–32)
Calcium: 9.3 mg/dL (ref 8.4–10.5)
Creatinine, Ser: 0.97 mg/dL (ref 0.50–1.35)
GFR calc Af Amer: >90 mL/min (ref >90)
GFR calc non Af Amer: >90 mL/min (ref >90)
Glucose, Bld: 116 mg/dL — ABNORMAL HIGH (ref 70–99)
POTASSIUM: 3.7 meq/L (ref 3.7–5.3)
SODIUM: 142 meq/L (ref 137–147)

## 2013-12-06 LAB — URINALYSIS, ROUTINE W REFLEX MICROSCOPIC
Glucose, UA: NEGATIVE mg/dL
Hgb urine dipstick: NEGATIVE
Ketones, ur: 15 mg/dL — AB
LEUKOCYTES UA: NEGATIVE
NITRITE: NEGATIVE
PROTEIN: NEGATIVE mg/dL
Specific Gravity, Urine: 1.029 (ref 1.005–1.030)
Urobilinogen, UA: 0.2 mg/dL (ref 0.0–1.0)
pH: 5.5 (ref 5.0–8.0)

## 2013-12-06 LAB — LACTIC ACID, PLASMA: LACTIC ACID, VENOUS: 1.3 mmol/L (ref 0.5–2.2)

## 2013-12-06 MED ORDER — ENOXAPARIN SODIUM 40 MG/0.4ML ~~LOC~~ SOLN
40.0000 mg | SUBCUTANEOUS | Status: DC
  Administered 2013-12-06 – 2013-12-08 (×3): 40 mg via SUBCUTANEOUS
  Filled 2013-12-06 (×5): qty 0.4

## 2013-12-06 MED ORDER — ONDANSETRON HCL 4 MG/2ML IJ SOLN
4.0000 mg | Freq: Once | INTRAMUSCULAR | Status: AC
  Administered 2013-12-06: 4 mg via INTRAVENOUS
  Filled 2013-12-06: qty 2

## 2013-12-06 MED ORDER — ONDANSETRON HCL 4 MG PO TABS: 4.0000 mg | ORAL_TABLET | Freq: Four times a day (QID) | ORAL | Status: DC | PRN

## 2013-12-06 MED ORDER — HYDROMORPHONE HCL PF 1 MG/ML IJ SOLN
1.0000 mg | Freq: Once | INTRAMUSCULAR | Status: AC
  Administered 2013-12-06: 1 mg via INTRAVENOUS
  Filled 2013-12-06: qty 1

## 2013-12-06 MED ORDER — ONDANSETRON HCL 4 MG/2ML IJ SOLN: 4.0000 mg | Freq: Four times a day (QID) | INTRAMUSCULAR | Status: DC | PRN

## 2013-12-06 MED ORDER — ACETAMINOPHEN 650 MG RE SUPP: 650.0000 mg | Freq: Four times a day (QID) | RECTAL | Status: DC | PRN

## 2013-12-06 MED ORDER — CIPROFLOXACIN IN D5W 400 MG/200ML IV SOLN
400.0000 mg | Freq: Once | INTRAVENOUS | Status: AC
  Administered 2013-12-06: 400 mg via INTRAVENOUS
  Filled 2013-12-06: qty 200

## 2013-12-06 MED ORDER — HYDROCODONE-ACETAMINOPHEN 5-325 MG PO TABS
1.0000 | ORAL_TABLET | ORAL | Status: DC | PRN
  Administered 2013-12-06 – 2013-12-07 (×2): 2 via ORAL
  Filled 2013-12-06 (×2): qty 2

## 2013-12-06 MED ORDER — METRONIDAZOLE IN NACL 5-0.79 MG/ML-% IV SOLN
500.0000 mg | Freq: Three times a day (TID) | INTRAVENOUS | Status: DC
  Administered 2013-12-06 – 2013-12-08 (×7): 500 mg via INTRAVENOUS
  Filled 2013-12-06 (×11): qty 100

## 2013-12-06 MED ORDER — MORPHINE SULFATE 4 MG/ML IJ SOLN
4.0000 mg | Freq: Once | INTRAMUSCULAR | Status: AC
  Administered 2013-12-06: 4 mg via INTRAVENOUS
  Filled 2013-12-06: qty 1

## 2013-12-06 MED ORDER — SODIUM CHLORIDE 0.9 % IV SOLN: INTRAVENOUS | Status: DC

## 2013-12-06 MED ORDER — ACETAMINOPHEN 325 MG PO TABS: 650.0000 mg | ORAL_TABLET | Freq: Four times a day (QID) | ORAL | Status: DC | PRN

## 2013-12-06 MED ORDER — IOHEXOL 300 MG/ML  SOLN
100.0000 mL | Freq: Once | INTRAMUSCULAR | Status: AC | PRN
  Administered 2013-12-06: 100 mL via INTRAVENOUS

## 2013-12-06 MED ORDER — CIPROFLOXACIN IN D5W 400 MG/200ML IV SOLN
400.0000 mg | Freq: Two times a day (BID) | INTRAVENOUS | Status: DC
  Administered 2013-12-07 – 2013-12-09 (×5): 400 mg via INTRAVENOUS
  Filled 2013-12-06 (×6): qty 200

## 2013-12-06 MED ORDER — METRONIDAZOLE IN NACL 5-0.79 MG/ML-% IV SOLN
500.0000 mg | Freq: Once | INTRAVENOUS | Status: AC
  Administered 2013-12-06: 500 mg via INTRAVENOUS
  Filled 2013-12-06: qty 100

## 2013-12-06 MED ORDER — SODIUM CHLORIDE 0.9 % IV SOLN
INTRAVENOUS | Status: DC
  Administered 2013-12-07 – 2013-12-08 (×3): via INTRAVENOUS

## 2013-12-06 MED ORDER — HYDROMORPHONE HCL PF 1 MG/ML IJ SOLN
1.0000 mg | INTRAMUSCULAR | Status: DC | PRN
  Administered 2013-12-06 – 2013-12-07 (×7): 1 mg via INTRAVENOUS
  Filled 2013-12-06 (×7): qty 1

## 2013-12-06 MED ORDER — IOHEXOL 300 MG/ML  SOLN
25.0000 mL | Freq: Once | INTRAMUSCULAR | Status: AC | PRN
  Administered 2013-12-06: 25 mL via ORAL

## 2013-12-06 MED ORDER — SODIUM CHLORIDE 0.9 % IV BOLUS (SEPSIS)
1000.0000 mL | Freq: Once | INTRAVENOUS | Status: AC
  Administered 2013-12-06: 1000 mL via INTRAVENOUS

## 2013-12-06 NOTE — ED Provider Notes (Signed)
CSN: [id number]     Arrival date & time 12/06/13  1153 History   First MD Initiated Contact with Patient 12/06/13 1154     Chief Complaint  Patient presents with  . Abdominal Pain     (Consider location/radiation/quality/duration/timing/severity/associated sxs/prior Treatment) HPI  Patient presents to the ER with complaints of left lower quadrant abdominal pains. He reports having a hx of perforated diverticulum and then later another "almost perf" and reports this feels worse then that pain did. He felt completely fine yesterday and started hurting severely when he woke up this morning. He also noticed pain when he urinated. He denied dysuria, hematuria or hesitancy. He denies penile discharge or testicular pain. He has not had vomiting, diarrhea, diaphoresis, chest pains, SOB or fevers.  The pain is constant and does not radiate.  Normal vital signs, pt appears uncomfortable.   Past Medical History  Diagnosis Date  . Diverticulitis    History reviewed. No pertinent past surgical history. No family history on file. History  Substance Use Topics  . Smoking status: Never Smoker   . Smokeless tobacco: Current User  . Alcohol Use: Yes     Comment: occasionally     Review of Systems   Review of Systems  Gen: no weight loss, fevers, chills, night sweats  Eyes: no discharge or drainage, no occular pain or visual changes  Nose: no epistaxis or rhinorrhea  Mouth: no dental pain, no sore throat  Neck: no neck pain  Lungs:No wheezing, coughing or hemoptysis CV: no chest pain, palpitations, dependent edema or orthopnea  Abd: + abdominal pain, - nausea, vomiting, diarrhea GU: no dysuria or gross hematuria  MSK:  No muscle weakness or pain Neuro: no headache, no focal neurologic deficits  Skin: no rash or wounds Psyche: no complaints    Allergies  Cephalexin  Home Medications   Prior to Admission medications   Medication Sig Start Date End Date Taking? Authorizing Provider    amphetamine-dextroamphetamine (ADDERALL XR) 25 MG 24 hr capsule Take 25 mg by mouth every morning.   Yes Historical Provider, MD   BP 119/61  Pulse 80  Temp(Src) 97.5 F (36.4 C) (Oral)  Resp 15  Ht 6\' 1"  (1.854 m)  Wt 248 lb (112.492 kg)  BMI 32.73 kg/m2  SpO2 100% Physical Exam  Nursing note and vitals reviewed. Constitutional: He appears well-developed and well-nourished. No distress.  HENT:  Head: Normocephalic and atraumatic.  Eyes: Pupils are equal, round, and reactive to light.  Neck: Normal range of motion. Neck supple.  Cardiovascular: Normal rate and regular rhythm.   Pulmonary/Chest: Effort normal.  Abdominal: Soft. Bowel sounds are normal. He exhibits no distension. There is tenderness in the left lower quadrant. There is guarding. There is no rigidity, no rebound and no CVA tenderness.    Exam limited by body habitus  Neurological: He is alert.  Skin: Skin is warm and dry.    ED Course  Procedures (including critical care time) Labs Review Labs Reviewed  URINALYSIS, ROUTINE W REFLEX MICROSCOPIC - Abnormal; Notable for the following:    Color, Urine AMBER (*)    Bilirubin Urine SMALL (*)    Ketones, ur 15 (*)    All other components within normal limits  BASIC METABOLIC PANEL - Abnormal; Notable for the following:    Glucose, Bld 116 (*)    Anion gap 18 (*)    All other components within normal limits  CBC WITH DIFFERENTIAL  LACTIC ACID, PLASMA  Imaging Review Ct Abdomen Pelvis W Contrast  12/06/2013   CLINICAL DATA:  Worsening left lower quadrant pain.  Diverticulitis.  EXAM: CT ABDOMEN AND PELVIS WITH CONTRAST  TECHNIQUE: Multidetector CT imaging of the abdomen and pelvis was performed using the standard protocol following bolus administration of intravenous contrast.  CONTRAST:  100mL OMNIPAQUE IOHEXOL 300 MG/ML  SOLN  COMPARISON:  08/23/2013  FINDINGS: Colonic diverticulosis again noted. Previously seen pericolonic inflammatory changes along the  descending colon have nearly completely resolved. However, there is new wall thickening and pericolonic inflammatory change involving the proximal sigmoid colon, consistent with acute diverticulitis.  There is no evidence of abscess or free fluid. No evidence of bowel obstruction. Normal appendix is visualized.  Focal fatty infiltration is again noted along the falciform ligament. No liver masses are identified. The gallbladder, pancreas, spleen, adrenal glands, and kidneys are normal in appearance. No evidence of hydronephrosis.  IMPRESSION: New mild sigmoid diverticulitis since prior study. No evidence of abscess or other complication.   Electronically Signed   By: Myles RosenthalJohn  Stahl M.D.   On: 12/06/2013 14:18     EKG Interpretation None      MDM   Final diagnoses:  Diverticulitis of large intestine without perforation or abscess without bleeding    Medications  HYDROmorphone (DILAUDID) injection 1 mg (not administered)  ciprofloxacin (CIPRO) IVPB 400 mg (not administered)  metroNIDAZOLE (FLAGYL) IVPB 500 mg (not administered)  0.9 %  sodium chloride infusion (not administered)  iohexol (OMNIPAQUE) 300 MG/ML solution 25 mL (25 mLs Oral Contrast Given 12/06/13 1243)  sodium chloride 0.9 % bolus 1,000 mL (1,000 mLs Intravenous New Bag/Given 12/06/13 1314)  morphine 4 MG/ML injection 4 mg (4 mg Intravenous Given 12/06/13 1314)  ondansetron (ZOFRAN) injection 4 mg (4 mg Intravenous Given 12/06/13 1314)  morphine 4 MG/ML injection 4 mg (4 mg Intravenous Given 12/06/13 1409)  iohexol (OMNIPAQUE) 300 MG/ML solution 100 mL (100 mLs Intravenous Contrast Given 12/06/13 1354)  morphine 4 MG/ML injection 4 mg (4 mg Intravenous Given 12/06/13 1422)    1: 18 pm Urinalysis and CBC are reassuring, CT abd/pelv ordered to evaluate abdomen and r/o diverticulitis vs perforation.    2:47 pm The patient has had 3 rounds or 4 mg Morphine and he reports that it has not touched his pain. His CT scan shows uncomplicated  diverticulitis without perforation. I added on a lactate per Dr. Martha ClanWilliam Shaw ([facility]). Dr. Clelia CroftShaw has been very kind to accept the patient for admission due to [facility] feeling so poorly.  Temp admit orders placed, [facility]  Dorthula Matasiffany G Consuella Scurlock, PA-C 12/06/13 1453

## 2013-12-06 NOTE — ED Notes (Signed)
Pt complains of abd pain left lower quad. Pt states it started this morning around 1030. Pt also complain of urnary retention.Denies N/V/D/fever

## 2013-12-06 NOTE — H&P (Signed)
PCP:   Martha Clan, MD   Chief Complaint:  Abdominal pain  HPI: [name] is a 23 year old white male with a history of recurrent diverticulitis who presented to the emergency department with the complaint of left lower quadrant abdominal pain. Patient states that he was in his usual state of health when he awoke this morning about 10:30 AM. When he stood up he felt excruciating left lower quadrant pain which was severe in nature. He feels that this pain was much more severe than he experienced in 02/2011 when he was hospitalized for 4 days for acute diverticulitis versus colitis of the proximal descending colon with findings suggestive of perforation. This resolved with treatment with Cipro and Flagyl. He had another, less severe episode in 4/15. At that time CT done in the emergency showed diverticulitis of the distal descending colon. This was treated as an outpatient with oral Flagyl and Cipro.  I saw him in followup after this episode and referred him to GI for further evaluation. However, after meeting with our referral coordinator he decided to delay this visit until after a trip to [place] and has never scheduled this.  He reports that he has been in good health since then. He has lost weight intentionally which he is pleased with. Denies any nausea, vomiting, diarrhea, constipation, hematochezia, or abdominal pain between episodes.  In the emergency department his pain was very severe requiring multiple doses of aquatics to control. CT of the abdomen and pelvis shows mild sigmoid diverticulitis which is new compared to prior study. No evidence of abscess or other complication.  Due to the severity of his pain he did not feel that he was able to return home.   Review of Systems:  Review of Systems - All systems reviewed with the patient and are negative except in history of present illness. Past Medical History: Past Medical History  Diagnosis Date  . Diverticulitis   ADD, IGT,  migraines, epistaxis, central dermatitis, obesity, acute diverticulitis with perforation (10/12)- recurrent  4/15   History reviewed. No pertinent past surgical history.  Medications: Prior to Admission medications   Medication Sig Start Date End Date Taking? Authorizing Provider  amphetamine-dextroamphetamine (ADDERALL XR) 25 MG 24 hr capsule Take 25 mg by mouth every morning.   Yes Historical Provider, MD    Allergies:   Allergies  Allergen Reactions  . Cephalexin Hives    Social History:  pt is single with no children. 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  Family History: Father has a history of low testosterone. Mother has neuropathy. Maternal grandmother died from lung cancer. paternal/maternal grandfathers have history of alcoholism. brother has ADHD.  Physical Exam: Filed Vitals:   12/06/13 1430 12/06/13 1433 12/06/13 1500 12/06/13 1530  BP: 119/61  139/59 122/76  Pulse: 75 80 73 76  Temp:    98.1 F (36.7 C)  TempSrc:    Oral  Resp:    18  Height:    6\' 1"  (1.854 m)  Weight:    113.6 kg (250 lb 7.1 oz)  SpO2: 97% 100% 97% 99%   General appearance: alert, no distress and In obvious discomfort with any palpation of his left lower quadrant Head: Normocephalic, without obvious abnormality, atraumatic Eyes: conjunctivae/corneas clear. PERRL, EOM's intact.  Nose: Nares normal.  Septum midline. Mucosa normal. No drainage or sinus tenderness. Throat: lips, mucosa, and tongue normal; teeth and gums normal Neck: no adenopathy, no carotid bruit, no JVD and thyroid not enlarged, symmetric, no tenderness/mass/nodules Resp: clear to auscultation bilaterally Cardio: regular rate and rhythm GI: soft, nondistended with hypoactive bowel sounds; significant left lower  quadrant tenderness with guarding; no definite rebound Extremities: extremities normal, atraumatic, no cyanosis or edema Pulses: 2+ and symmetric Lymph nodes: no cervical lymphadenopathy Neurologic: Alert and oriented X 3, normal strength and tone.    Labs on Admission:   Recent Labs  12/06/13 1223  NA 142  K 3.7  CL 101  CO2 23  GLUCOSE 116*  BUN 14  CREATININE 0.97  CALCIUM 9.3     Recent Labs  12/06/13 1223  WBC 8.7  NEUTROABS 6.6  HGB 14.8  HCT 43.2  MCV 85.7  PLT 210    Radiological Exams on Admission: Ct Abdomen Pelvis W Contrast  12/06/2013   CLINICAL DATA:  Worsening left lower quadrant pain.  Diverticulitis.  EXAM: CT ABDOMEN AND PELVIS WITH CONTRAST  TECHNIQUE: Multidetector CT imaging of the abdomen and pelvis was performed using the standard protocol following bolus administration of intravenous contrast.  CONTRAST:  100mL OMNIPAQUE IOHEXOL 300 MG/ML  SOLN  COMPARISON:  08/23/2013  FINDINGS: Colonic diverticulosis again noted. Previously seen pericolonic inflammatory changes along the descending colon have nearly completely resolved. However, there is new wall thickening and pericolonic inflammatory change involving the proximal sigmoid colon, consistent with acute diverticulitis.  There is no evidence of abscess or free fluid. No evidence of bowel obstruction. Normal appendix is visualized.  Focal fatty infiltration is again noted along the falciform ligament. No liver masses are identified. The gallbladder, pancreas, spleen, adrenal glands, and kidneys are normal in appearance. No evidence of hydronephrosis.  IMPRESSION: New mild sigmoid diverticulitis since prior study. No evidence of abscess or other complication.   Electronically Signed   By: Myles RosenthalJohn  Stahl M.D.   On: 12/06/2013 14:18   No orders found for this or any previous visit.  Assessment/Plan Principal Problem: 1. Acute Diverticulitis of large intestine without perforation or abscess without bleeding-  this is been a recurrent issue for him with this being his third discrete episode of acute diverticulitis. His first episode was complicated by perforation but there is no evidence on CT at this time. He does have severe pain and tenderness on exam which is out of proportion to CT findings. He will be treated for diverticulitis with Cipro and Flagyl IV and IV fluids. Continue pain control with IV Dilantin and transitioned to oral pain medications. We'll consult GI to help with evaluation of recurrent diverticulitis. I think he will need a colonoscopy once his acute flare has stabilized. [place] is planning to [place] to [place] which could present a challenge for followup.    Active Problems: 2. Obesity, unspecified-he has lost a significant amount weight over the past year with diet changes and increased activity. 3. Disposition- anticipate discharge to home in 2-3 days once he is able to tolerate oral medications and controlling the pain. Start with clear liquid diet and advance as tolerated.  Martha ClanShaw, Natanya Holecek 12/06/2013, 6:07 PM

## 2013-12-06 NOTE — ED Provider Notes (Signed)
  This was a shared visit with a mid-level provided (NP or PA).  Throughout the patient's course I was available for consultation/collaboration.  I saw the relevant labs and studies - I agree with the interpretation.  On my exam the patient was very uncomfortable, and required additional analgesia for control of this abdominal pain. Given the patient's recurrent diverticulitis, and his severe pain, he required admission for further evaluation and management.      Gerhard Munchobert Tatjana Turcott, MD 12/06/13 1525

## 2013-12-06 NOTE — ED Notes (Signed)
Pt did not receive all of medication due to malpositioned IV. PA notified ro reorder

## 2013-12-07 LAB — COMPREHENSIVE METABOLIC PANEL
ALT: 16 U/L (ref 0–53)
AST: 13 U/L (ref 0–37)
Albumin: 3.3 g/dL — ABNORMAL LOW (ref 3.5–5.2)
Alkaline Phosphatase: 59 U/L (ref 39–117)
Anion gap: 13 (ref 5–15)
BUN: 9 mg/dL (ref 6–23)
CALCIUM: 8.4 mg/dL (ref 8.4–10.5)
CO2: 24 meq/L (ref 19–32)
Chloride: 101 mEq/L (ref 96–112)
Creatinine, Ser: 0.98 mg/dL (ref 0.50–1.35)
GFR calc non Af Amer: >90 mL/min (ref >90)
GLUCOSE: 95 mg/dL (ref 70–99)
Potassium: 3.9 mEq/L (ref 3.7–5.3)
Sodium: 138 mEq/L (ref 137–147)
TOTAL PROTEIN: 6 g/dL (ref 6.0–8.3)
Total Bilirubin: 0.9 mg/dL (ref 0.3–1.2)

## 2013-12-07 LAB — CBC
HCT: 38.5 % — ABNORMAL LOW (ref 39.0–52.0)
Hemoglobin: 13 g/dL (ref 13.0–17.0)
MCH: 29.5 pg (ref 26.0–34.0)
MCHC: 33.8 g/dL (ref 30.0–36.0)
MCV: 87.5 fL (ref 78.0–100.0)
PLATELETS: 181 10*3/uL (ref 150–400)
RBC: 4.4 MIL/uL (ref 4.22–5.81)
RDW: 13.7 % (ref 11.5–15.5)
WBC: 7.1 10*3/uL (ref 4.0–10.5)

## 2013-12-07 MED ORDER — HYDROMORPHONE HCL PF 1 MG/ML IJ SOLN
1.0000 mg | INTRAMUSCULAR | Status: DC | PRN
  Administered 2013-12-07 (×2): 2 mg via INTRAVENOUS
  Administered 2013-12-08: 1 mg via INTRAVENOUS
  Administered 2013-12-08 (×3): 2 mg via INTRAVENOUS
  Administered 2013-12-08: 1 mg via INTRAVENOUS
  Administered 2013-12-08 – 2013-12-09 (×2): 2 mg via INTRAVENOUS
  Filled 2013-12-07: qty 2
  Filled 2013-12-07: qty 1
  Filled 2013-12-07 (×5): qty 2
  Filled 2013-12-07: qty 1
  Filled 2013-12-07: qty 2

## 2013-12-07 MED ORDER — DOCUSATE SODIUM 100 MG PO CAPS
100.0000 mg | ORAL_CAPSULE | Freq: Every day | ORAL | Status: DC
  Administered 2013-12-07 – 2013-12-08 (×2): 100 mg via ORAL
  Filled 2013-12-07 (×2): qty 1

## 2013-12-07 MED ORDER — POLYETHYLENE GLYCOL 3350 17 G PO PACK
17.0000 g | PACK | Freq: Every day | ORAL | Status: DC | PRN
  Administered 2013-12-07: 17 g via ORAL
  Filled 2013-12-07: qty 1

## 2013-12-07 NOTE — Consult Note (Signed)
Consult for [name]  Reason for Consult: Recurrent diverticulitis Referring Physician: Buren Kosouglas Shaw, M.D.  Benetta SparEthan Giarratano HPI: This is a 23 year old male with a recurrent history of left sided diverticulitis.  His first episode was in 02/2011 and he was noted to have a perforated diverticulitis in the proximal descending colon.  The second episode was in 08/2013 and the affected area was in the distal descending colon.  During this episodes the CT scan reveals a sigmoid colon diverticulitis, but it appears that his pain is out of proportion to his CT scan findings.  He was referred to [facility] in the past, but he never followed up with any appointments and he is going to move to [place] this coming week.  During all of these events he was treated conservatively with antibiotics.  In fact, during his second episode he was treated as an outpatient.  Past Medical History  Diagnosis Date  . Diverticulitis     History reviewed. No pertinent past surgical history.  History reviewed. No pertinent family history.  Social History:  reports that he has never smoked. He uses smokeless tobacco. He reports that he drinks alcohol. He reports that he does not use illicit drugs.  Allergies:  Allergies  Allergen Reactions  . Cephalexin Hives    Medications:  Scheduled: . ciprofloxacin  400 mg Intravenous Q12H  . docusate sodium  100 mg Oral Daily  . enoxaparin (LOVENOX) injection  40 mg Subcutaneous Q24H  . metronidazole  500 mg Intravenous Q8H   Continuous: . sodium chloride 75 mL/hr at 12/06/13 1933    Results for orders placed during the hospital encounter of 12/06/13 (from the past 24 hour(s))  CBC WITH DIFFERENTIAL     Status: None   Collection Time    12/06/13 12:23 PM      Result Value Ref Range   WBC 8.7  4.0 - 10.5 K/uL   RBC 5.04  4.22 - 5.81 MIL/uL   Hemoglobin 14.8  13.0 - 17.0 g/dL   HCT 16.143.2  09.639.0 - 04.552.0 %   MCV 85.7  78.0 - 100.0 fL   MCH 29.4  26.0 - 34.0 pg   MCHC 34.3   30.0 - 36.0 g/dL   RDW 40.913.5  81.111.5 - 91.415.5 %   Platelets 210  150 - 400 K/uL   Neutrophils Relative % 77  43 - 77 %   Neutro Abs 6.6  1.7 - 7.7 K/uL   Lymphocytes Relative 15  12 - 46 %   Lymphs Abs 1.3  0.7 - 4.0 K/uL   Monocytes Relative 7  3 - 12 %   Monocytes Absolute 0.6  0.1 - 1.0 K/uL   Eosinophils Relative 1  0 - 5 %   Eosinophils Absolute 0.1  0.0 - 0.7 K/uL   Basophils Relative 0  0 - 1 %   Basophils Absolute 0.0  0.0 - 0.1 K/uL  BASIC METABOLIC PANEL     Status: Abnormal   Collection Time    12/06/13 12:23 PM      Result Value Ref Range   Sodium 142  137 - 147 mEq/L   Potassium 3.7  3.7 - 5.3 mEq/L   Chloride 101  96 - 112 mEq/L   CO2 23  19 - 32 mEq/L   Glucose, Bld 116 (*) 70 - 99 mg/dL   BUN 14  6 - 23 mg/dL   Creatinine, Ser 7.820.97  0.50 - 1.35 mg/dL   Calcium 9.3  8.4 - 10.5 mg/dL   GFR calc non Af Amer >90  >90 mL/min   GFR calc Af Amer >90  >90 mL/min   Anion gap 18 (*) 5 - 15  URINALYSIS, ROUTINE W REFLEX MICROSCOPIC     Status: Abnormal   Collection Time    12/06/13 12:27 PM      Result Value Ref Range   Color, Urine AMBER (*) YELLOW   APPearance CLEAR  CLEAR   Specific Gravity, Urine 1.029  1.005 - 1.030   pH 5.5  5.0 - 8.0   Glucose, UA NEGATIVE  NEGATIVE mg/dL   Hgb urine dipstick NEGATIVE  NEGATIVE   Bilirubin Urine SMALL (*) NEGATIVE   Ketones, ur 15 (*) NEGATIVE mg/dL   Protein, ur NEGATIVE  NEGATIVE mg/dL   Urobilinogen, UA 0.2  0.0 - 1.0 mg/dL   Nitrite NEGATIVE  NEGATIVE   Leukocytes, UA NEGATIVE  NEGATIVE  LACTIC ACID, PLASMA     Status: None   Collection Time    12/06/13  2:50 PM      Result Value Ref Range   Lactic Acid, Venous 1.3  0.5 - 2.2 mmol/L  COMPREHENSIVE METABOLIC PANEL     Status: Abnormal   Collection Time    12/07/13  3:36 AM      Result Value Ref Range   Sodium 138  137 - 147 mEq/L   Potassium 3.9  3.7 - 5.3 mEq/L   Chloride 101  96 - 112 mEq/L   CO2 24  19 - 32 mEq/L   Glucose, Bld 95  70 - 99 mg/dL   BUN 9  6 - 23  mg/dL   Creatinine, Ser 1.61  0.50 - 1.35 mg/dL   Calcium 8.4  8.4 - 09.6 mg/dL   Total Protein 6.0  6.0 - 8.3 g/dL   Albumin 3.3 (*) 3.5 - 5.2 g/dL   AST 13  0 - 37 U/L   ALT 16  0 - 53 U/L   Alkaline Phosphatase 59  39 - 117 U/L   Total Bilirubin 0.9  0.3 - 1.2 mg/dL   GFR calc non Af Amer >90  >90 mL/min   GFR calc Af Amer >90  >90 mL/min   Anion gap 13  5 - 15  CBC     Status: Abnormal   Collection Time    12/07/13  3:36 AM      Result Value Ref Range   WBC 7.1  4.0 - 10.5 K/uL   RBC 4.40  4.22 - 5.81 MIL/uL   Hemoglobin 13.0  13.0 - 17.0 g/dL   HCT 04.5 (*) 40.9 - 81.1 %   MCV 87.5  78.0 - 100.0 fL   MCH 29.5  26.0 - 34.0 pg   MCHC 33.8  30.0 - 36.0 g/dL   RDW 91.4  78.2 - 95.6 %   Platelets 181  150 - 400 K/uL     Ct Abdomen Pelvis W Contrast  12/06/2013   CLINICAL DATA:  Worsening left lower quadrant pain.  Diverticulitis.  EXAM: CT ABDOMEN AND PELVIS WITH CONTRAST  TECHNIQUE: Multidetector CT imaging of the abdomen and pelvis was performed using the standard protocol following bolus administration of intravenous contrast.  CONTRAST:  OMNIPAQUE IOHEXOL 300 MG/ML  SOLN  COMPARISON:  08/23/2013  FINDINGS: Colonic diverticulosis again noted. Previously seen pericolonic inflammatory changes along the descending colon have nearly completely resolved. However, there is new wall thickening and pericolonic inflammatory change involving the proximal sigmoid  colon, consistent with acute diverticulitis.  There is no evidence of abscess or free fluid. No evidence of bowel obstruction. Normal appendix is visualized.  Focal fatty infiltration is again noted along the falciform ligament. No liver masses are identified. The gallbladder, pancreas, spleen, adrenal glands, and kidneys are normal in appearance. No evidence of hydronephrosis.  IMPRESSION: New mild sigmoid diverticulitis since prior study. No evidence of abscess or other complication.   Electronically Signed   By: Myles Rosenthal M.D.    On: 12/06/2013 14:18    ROS:  As stated above in the HPI otherwise negative.  Blood pressure 114/55, pulse 62, temperature 98.3 F (36.8 C), temperature source Oral, resp. rate 17, height 6\' 1"  (1.854 m), weight 250 lb 7.1 oz (113.6 kg), SpO2 93.00%.    PE: Gen: NAD, Alert and Oriented HEENT:  [place]/AT, EOMI Neck: Supple, no LAD Lungs: CTA Bilaterally CV: RRR without M/G/R ABM: Soft, obese, tender in the LLQ, +BS Ext: No C/C/E  Assessment/Plan: 1) Recurrent diverticulitis. 2) Left sided abdominal pain. 3) Obesity.   The patient will ultimately require a left hemicolectomy.  It is not a matter if he will have another episodes of diverticulitis, but when will the next event occur.    Plan: 1) Continue with Cipro and Flagyl. 2) Establish care when he moves to [place].    Euva Rundell D 12/07/2013, 10:54 AM

## 2013-12-07 NOTE — Progress Notes (Signed)
Subjective: Pain is improved but still requiring Dilaudid every 6 hours to maintain 6 out of 10 control.  No BM yet.    Objective: Vital signs in last 24 hours: Temp:  [97.5 F (36.4 C)-98.3 F (36.8 C)] 98.3 F (36.8 C) (07/25 0530) Pulse Rate:  [62-88] 62 (07/25 0530) Resp:  [15-18] 17 (07/25 0530) BP: (114-139)/(55-83) 114/55 mmHg (07/25 0530) SpO2:  [93 %-100 %] 93 % (07/25 0530) Weight:  [112.492 kg (248 lb)-113.6 kg (250 lb 7.1 oz)] 113.6 kg (250 lb 7.1 oz) (07/24 1530) Weight change:  Last BM Date: 12/05/13  CBG (last 3)  No results found for this basename: GLUCAP,  in the last 72 hours  Intake/Output from previous day: 07/24 0701 - 07/25 0700 In: -  Out: 350 [Urine:350] Intake/Output this shift: Total I/O In: 360 [P.O.:360] Out: -   General appearance: alert and no distress Eyes: no scleral icterus Throat: oropharynx moist without erythema Resp: clear to auscultation bilaterally Cardio: regular rate and rhythm GI: soft, non-distended with normal bowel sounds; left lower quadrant tenderness with guarding that may be a little distractable; no rebound Extremities: no clubbing, cyanosis or edema  Lab Results:  Recent Labs  12/06/13 1223 12/07/13 0336  NA 142 138  K 3.7 3.9  CL 101 101  CO2 23 24  GLUCOSE 116* 95  BUN 14 9  CREATININE 0.97 0.98  CALCIUM 9.3 8.4    Recent Labs  12/07/13 0336  AST 13  ALT 16  ALKPHOS 59  BILITOT 0.9  PROT 6.0  ALBUMIN 3.3*    Recent Labs  12/06/13 1223 12/07/13 0336  WBC 8.7 7.1  NEUTROABS 6.6  --   HGB 14.8 13.0  HCT 43.2 38.5*  MCV 85.7 87.5  PLT 210 181   No results found for this basename: INR, PROTIME   No results found for this basename: CKTOTAL, CKMB, CKMBINDEX, TROPONINI,  in the last 72 hours No results found for this basename: TSH, T4TOTAL, FREET3, T3FREE, THYROIDAB,  in the last 72 hours No results found for this basename: VITAMINB12, FOLATE, FERRITIN, TIBC, IRON, RETICCTPCT,  in the last 72  hours  Studies/Results: Ct Abdomen Pelvis W Contrast  12/06/2013   CLINICAL DATA:  Worsening left lower quadrant pain.  Diverticulitis.  EXAM: CT ABDOMEN AND PELVIS WITH CONTRAST  TECHNIQUE: Multidetector CT imaging of the abdomen and pelvis was performed using the standard protocol following bolus administration of intravenous contrast.  CONTRAST:  100mL OMNIPAQUE IOHEXOL 300 MG/ML  SOLN  COMPARISON:  08/23/2013  FINDINGS: Colonic diverticulosis again noted. Previously seen pericolonic inflammatory changes along the descending colon have nearly completely resolved. However, there is new wall thickening and pericolonic inflammatory change involving the proximal sigmoid colon, consistent with acute diverticulitis.  There is no evidence of abscess or free fluid. No evidence of bowel obstruction. Normal appendix is visualized.  Focal fatty infiltration is again noted along the falciform ligament. No liver masses are identified. The gallbladder, pancreas, spleen, adrenal glands, and kidneys are normal in appearance. No evidence of hydronephrosis.  IMPRESSION: New mild sigmoid diverticulitis since prior study. No evidence of abscess or other complication.   Electronically Signed   By: Myles RosenthalJohn  Stahl M.D.   On: 12/06/2013 14:18     Medications: Scheduled: . ciprofloxacin  400 mg Intravenous Q12H  . enoxaparin (LOVENOX) injection  40 mg Subcutaneous Q24H  . metronidazole  500 mg Intravenous Q8H   Continuous: . sodium chloride 75 mL/hr at 12/06/13 1933    Assessment/Plan:  Principal Problem: 1. Diverticulitis of large intestine without perforation or abscess without bleeding- continue IV Cipro/Flagyl and pain control.  Transition to oral narcotics as able.  GI consult to assist with further evaluation (?outpatient colonoscopy once flare resolves though he is moving to [place] next week).  Will advance to full liquids today and add stool softener.   Active Problems: 2. Obesity, unspecified 3. Disposition-  possible discharge in 1-2 days of pain controlled with oral medications and tolerating diet.     LOS: 1 day   Martha Clan 12/07/2013, 10:25 AM

## 2013-12-07 NOTE — Progress Notes (Signed)
Call placed to Dr. Clelia CroftShaw regarding pain control. Patient unhappy with Vicodin. States it does not help his pain. New order obtained. Trina Aoarla Dallis Darden, RN

## 2013-12-08 MED ORDER — OXYCODONE-ACETAMINOPHEN 5-325 MG PO TABS
1.0000 | ORAL_TABLET | ORAL | Status: DC | PRN
  Administered 2013-12-08 – 2013-12-09 (×5): 2 via ORAL
  Filled 2013-12-08 (×5): qty 2

## 2013-12-08 MED ORDER — POLYETHYLENE GLYCOL 3350 17 G PO PACK
17.0000 g | PACK | Freq: Two times a day (BID) | ORAL | Status: DC
  Administered 2013-12-08 (×2): 17 g via ORAL
  Filled 2013-12-08 (×4): qty 1

## 2013-12-08 MED ORDER — FLEET ENEMA 7-19 GM/118ML RE ENEM
1.0000 | ENEMA | Freq: Once | RECTAL | Status: AC
  Administered 2013-12-08: 1 via RECTAL
  Filled 2013-12-08: qty 1

## 2013-12-08 NOTE — Progress Notes (Signed)
Subjective: Increased pain this morning improved with Dilaudid.  Vicodin not helpful.  Previously found that Percocet was more effective.  No appetite.  Unable to have BM.  Objective: Vital signs in last 24 hours: Temp:  [98.5 F (36.9 C)-99.5 F (37.5 C)] 99.5 F (37.5 C) (07/26 0602) Pulse Rate:  [68-98] 91 (07/26 0602) Resp:  [16-17] 17 (07/26 0602) BP: (117-128)/(59-70) 117/70 mmHg (07/26 0602) SpO2:  [95 %-99 %] 99 % (07/26 0602) Weight change:  Last BM Date: 12/05/13  CBG (last 3)  No results found for this basename: GLUCAP,  in the last 72 hours  Intake/Output from previous day: 07/25 0701 - 07/26 0700 In: 4007.9 [P.O.:960; I.V.:3047.9] Out: 1625 [Urine:1625] Intake/Output this shift:    General appearance: alert and no distress Eyes: no scleral icterus Throat: oropharynx moist without erythema Resp: clear to auscultation bilaterally Cardio: regular rate and rhythm GI: soft, non-distended, normal bowel sounds; left lower quadrant tenderness is distractable but marked at times with grimacing and guarding.  No rebound Extremities: no clubbing, cyanosis or edema   Lab Results:  Recent Labs  12/06/13 1223 12/07/13 0336  NA 142 138  K 3.7 3.9  CL 101 101  CO2 23 24  GLUCOSE 116* 95  BUN 14 9  CREATININE 0.97 0.98  CALCIUM 9.3 8.4    Recent Labs  12/07/13 0336  AST 13  ALT 16  ALKPHOS 59  BILITOT 0.9  PROT 6.0  ALBUMIN 3.3*    Recent Labs  12/06/13 1223 12/07/13 0336  WBC 8.7 7.1  NEUTROABS 6.6  --   HGB 14.8 13.0  HCT 43.2 38.5*  MCV 85.7 87.5  PLT 210 181    Studies/Results: Ct Abdomen Pelvis W Contrast  12/06/2013   CLINICAL DATA:  Worsening left lower quadrant pain.  Diverticulitis.  EXAM: CT ABDOMEN AND PELVIS WITH CONTRAST  TECHNIQUE: Multidetector CT imaging of the abdomen and pelvis was performed using the standard protocol following bolus administration of intravenous contrast.  CONTRAST:  OMNIPAQUE IOHEXOL 300 MG/ML  SOLN   COMPARISON:  08/23/2013  FINDINGS: Colonic diverticulosis again noted. Previously seen pericolonic inflammatory changes along the descending colon have nearly completely resolved. However, there is new wall thickening and pericolonic inflammatory change involving the proximal sigmoid colon, consistent with acute diverticulitis.  There is no evidence of abscess or free fluid. No evidence of bowel obstruction. Normal appendix is visualized.  Focal fatty infiltration is again noted along the falciform ligament. No liver masses are identified. The gallbladder, pancreas, spleen, adrenal glands, and kidneys are normal in appearance. No evidence of hydronephrosis.  IMPRESSION: New mild sigmoid diverticulitis since prior study. No evidence of abscess or other complication.   Electronically Signed   By: Myles Rosenthal M.D.   On: 12/06/2013 14:18     Medications: Scheduled: . ciprofloxacin  400 mg Intravenous Q12H  . docusate sodium  100 mg Oral Daily  . enoxaparin (LOVENOX) injection  40 mg Subcutaneous Q24H  . metronidazole  500 mg Intravenous Q8H   Continuous: . sodium chloride 100 mL/hr at 12/08/13 0224    Assessment/Plan: Principal Problem: 1. Diverticulitis of large intestine without perforation or abscess without bleeding- continue IV Cipro and Flagyl (Day #3/14).  Still requiring IV pain medications.  Will try Percocet as alternative.  Appreciate GI guidance- will need evaluation in [place] in 6 weeks and consider hemicolectomy given recurrent nature. Active Problems: 2. Constipation- will give Miralax and Fleets enema today.  BM may help to relieve pain. 3.  Disposition- still requiring IV pain medications.  Discharge to home once controlled on oral meds.   LOS: 2 days   Martha ClanShaw, Tereasa Yilmaz 12/08/2013, 8:38 AM

## 2013-12-08 NOTE — Progress Notes (Signed)
Went in to give DIRECTVFleet Enema. Patient wishes to wait. States he just had a BM- did not save. Told him I need to see it.

## 2013-12-08 NOTE — Progress Notes (Signed)
Subjective: Woke this AM with severe LLQ abdominal pain.  It is now controlled with pain medication.  Objective: Vital signs in last 24 hours: Temp:  [98.5 F (36.9 C)-99.5 F (37.5 C)] 99.5 F (37.5 C) (07/26 0602) Pulse Rate:  [68-98] 91 (07/26 0602) Resp:  [16-17] 17 (07/26 0602) BP: (117-128)/(59-70) 117/70 mmHg (07/26 0602) SpO2:  [95 %-99 %] 99 % (07/26 0602) Last BM Date: 12/05/13  Intake/Output from previous day: 07/25 0701 - 07/26 0700 In: 4007.9 [P.O.:960; I.V.:3047.9] Out: 1625 [Urine:1625] Intake/Output this shift:    General appearance: alert and no distress GI: very tender in the LLQ  Lab Results:  Recent Labs  12/06/13 1223 12/07/13 0336  WBC 8.7 7.1  HGB 14.8 13.0  HCT 43.2 38.5*  PLT 210 181   BMET  Recent Labs  12/06/13 1223 12/07/13 0336  NA 142 138  K 3.7 3.9  CL 101 101  CO2 23 24  GLUCOSE 116* 95  BUN 14 9  CREATININE 0.97 0.98  CALCIUM 9.3 8.4   LFT  Recent Labs  12/07/13 0336  PROT 6.0  ALBUMIN 3.3*  AST 13  ALT 16  ALKPHOS 59  BILITOT 0.9   PT/INR No results found for this basename: LABPROT, INR,  in the last 72 hours Hepatitis Panel No results found for this basename: HEPBSAG, HCVAB, HEPAIGM, HEPBIGM,  in the last 72 hours C-Diff No results found for this basename: CDIFFTOX,  in the last 72 hours Fecal Lactopherrin No results found for this basename: FECLLACTOFRN,  in the last 72 hours  Studies/Results: Ct Abdomen Pelvis W Contrast  12/06/2013   CLINICAL DATA:  Worsening left lower quadrant pain.  Diverticulitis.  EXAM: CT ABDOMEN AND PELVIS WITH CONTRAST  TECHNIQUE: Multidetector CT imaging of the abdomen and pelvis was performed using the standard protocol following bolus administration of intravenous contrast.  CONTRAST:  100mL OMNIPAQUE IOHEXOL 300 MG/ML  SOLN  COMPARISON:  08/23/2013  FINDINGS: Colonic diverticulosis again noted. Previously seen pericolonic inflammatory changes along the descending colon have  nearly completely resolved. However, there is new wall thickening and pericolonic inflammatory change involving the proximal sigmoid colon, consistent with acute diverticulitis.  There is no evidence of abscess or free fluid. No evidence of bowel obstruction. Normal appendix is visualized.  Focal fatty infiltration is again noted along the falciform ligament. No liver masses are identified. The gallbladder, pancreas, spleen, adrenal glands, and kidneys are normal in appearance. No evidence of hydronephrosis.  IMPRESSION: New mild sigmoid diverticulitis since prior study. No evidence of abscess or other complication.   Electronically Signed   By: Myles RosenthalJohn  Stahl M.D.   On: 12/06/2013 14:18    Medications:  Scheduled: . ciprofloxacin  400 mg Intravenous Q12H  . docusate sodium  100 mg Oral Daily  . enoxaparin (LOVENOX) injection  40 mg Subcutaneous Q24H  . metronidazole  500 mg Intravenous Q8H   Continuous: . sodium chloride 100 mL/hr at 12/08/13 0224    Assessment/Plan: 1) Diverticulitis. 2) Obesity.   The patient feels that his pain exacerbation was secondary to his movement in his bed last evening.  Currently he is better.  Plan: 1) Continue with Cipro/Flagyl. 2) Continue with pain medications.   LOS: 2 days   Adamarie Izzo D 12/08/2013, 7:12 AM

## 2013-12-09 MED ORDER — OXYCODONE-ACETAMINOPHEN 5-325 MG PO TABS: 1.0000 | ORAL_TABLET | ORAL | Status: DC | PRN

## 2013-12-09 MED ORDER — POLYETHYLENE GLYCOL 3350 17 G PO PACK: 17.0000 g | PACK | Freq: Two times a day (BID) | ORAL | Status: DC

## 2013-12-09 MED ORDER — METRONIDAZOLE 500 MG PO TABS: 500.0000 mg | ORAL_TABLET | Freq: Three times a day (TID) | ORAL | Status: AC

## 2013-12-09 MED ORDER — DSS 100 MG PO CAPS: 100.0000 mg | ORAL_CAPSULE | Freq: Every day | ORAL | Status: DC

## 2013-12-09 MED ORDER — CIPROFLOXACIN HCL 500 MG PO TABS: 500.0000 mg | ORAL_TABLET | Freq: Two times a day (BID) | ORAL | Status: AC

## 2013-12-09 NOTE — Discharge Instructions (Signed)

## 2013-12-09 NOTE — Progress Notes (Signed)
Discharge instructions reviewed with pt and prescriptions given.  Pt verbalized understanding and had no questions.  Pt discharged in stable condition.  Jaiyden Laur Lindsay   

## 2013-12-09 NOTE — Discharge Summary (Signed)
DISCHARGE SUMMARY  [name]  MR#: [id number]  DOB:1990/05/22  Date of Admission: 12/06/2013 Date of Discharge: 12/09/2013  Attending Physician:[name], [name]  Patient's WUJ:[name], John Noa, MD  Consults: John Hawking, MD- Gastroenterology  Discharge Diagnoses: Principal Problem:   Diverticulitis of large intestine without perforation or abscess without bleeding Active Problems:   Obesity, unspecified  Past Medical History ADD Impaired Glucose Tolerance Obesity Recurrent Diverticulitis- 10/12 hospitalized with microperforation; 4/15 treated with oral antibiotics  History reviewed. No pertinent past surgical history.   Discharge Medications:   Medication List         amphetamine-dextroamphetamine 25 MG 24 hr capsule  Commonly known as:  ADDERALL XR  Take 25 mg by mouth every morning.     ciprofloxacin 500 MG tablet  Commonly known as:  CIPRO  Take 1 tablet (500 mg total) by mouth 2 (two) times daily.     DSS 100 MG Caps  Take 100 mg by mouth daily.     metroNIDAZOLE 500 MG tablet  Commonly known as:  FLAGYL  Take 1 tablet (500 mg total) by mouth 3 (three) times daily.     oxyCODONE-acetaminophen 5-325 MG per tablet  Commonly known as:  PERCOCET/ROXICET  Take 1-2 tablets by mouth every 4 (four) hours as needed for moderate pain or severe pain.     polyethylene glycol packet  Commonly known as:  MIRALAX / GLYCOLAX  Take 17 g by mouth 2 (two) times daily.        Hospital Procedures: Ct Abdomen Pelvis W Contrast  12/06/2013   CLINICAL DATA:  Worsening left lower quadrant pain.  Diverticulitis.  EXAM: CT ABDOMEN AND PELVIS WITH CONTRAST  TECHNIQUE: Multidetector CT imaging of the abdomen and pelvis was performed using the standard protocol following bolus administration of intravenous contrast.  CONTRAST:  OMNIPAQUE IOHEXOL 300 MG/ML  SOLN  COMPARISON:  08/23/2013  FINDINGS: Colonic diverticulosis again noted. Previously seen pericolonic inflammatory  changes along the descending colon have nearly completely resolved. However, there is new wall thickening and pericolonic inflammatory change involving the proximal sigmoid colon, consistent with acute diverticulitis.  There is no evidence of abscess or free fluid. No evidence of bowel obstruction. Normal appendix is visualized.  Focal fatty infiltration is again noted along the falciform ligament. No liver masses are identified. The gallbladder, pancreas, spleen, adrenal glands, and kidneys are normal in appearance. No evidence of hydronephrosis.  IMPRESSION: New mild sigmoid diverticulitis since prior study. No evidence of abscess or other complication.   Electronically Signed   By: [name] M.D.   On: 12/06/2013 14:18    History of Present Illness: [name] is a 23 year old white male with a history of recurrent diverticulitis who presented to the emergency department with the complaint of left lower quadrant abdominal pain. Patient states that he was in his usual state of health when he awoke this morning about 10:30 AM. When he stood up he felt excruciating left lower quadrant pain which was severe in nature. He feels that this pain was much more severe than he experienced in 24/2012 when he was hospitalized for 4 days for acute diverticulitis versus colitis of the proximal descending colon with findings suggestive of perforation. This resolved with treatment with Cipro and Flagyl. He had another, less severe episode in 4/15. At that time CT done in the emergency showed diverticulitis of the distal descending colon. This was treated as an outpatient with oral Flagyl and Cipro. I saw him in followup after this episode and  referred him to GI for further evaluation. However, after meeting with our referral coordinator he decided to delay this visit until after a trip to [place] and has never scheduled this. He reports that he has been in good health since then. He has lost weight intentionally which he  is pleased with. Denies any nausea, vomiting, diarrhea, constipation, hematochezia, or abdominal pain between episodes. In the emergency department his pain was very severe requiring multiple doses of aquatics to control.  CT of the abdomen and pelvis shows mild sigmoid diverticulitis which is new compared to prior study. No evidence of abscess or other complication. Due to the severity of his pain he did not feel that he was able to return home.   Hospital Course: [name] was admitted to a medical bed. He was treated for acute diverticulitis with Cipro and Flagyl IV and IV fluids. He continued to have significant left lower quadrant pain and tenderness requiring Dilaudid IV. He has been able to increase the interval between doses to up to 10 hours. He now feels that his pain can be controlled with oral Percocet. He was evaluated by gastroenterology who recommended outpatient colonoscopy after his acute flare in about 6 weeks. Unfortunately, he will be moving to [place] this coming week and will need to establish with a gastroenterologist in [place] for this procedure.  He will likely need to consider surgical management since this is his third bout of diverticulitis in the last 24 years. He is now tolerating a regular diet. Recommended high fiber, low-residue diet and avoidance of constipation. He is stable for discharge home.  Day of Discharge Exam BP 118/61  Pulse 63  Temp(Src) 97.8 F (36.6 C) (Oral)  Resp 17  Ht 6\' 1"  (1.854 m)  Wt 113.6 kg (250 lb 7.1 oz)  BMI 33.05 kg/m2  SpO2 97%  Physical Exam: General appearance: alert and no distress Eyes: no scleral icterus Throat: oropharynx moist without erythema Resp: clear to auscultation bilaterally Cardio: regular rate and rhythm GI: soft, nondistended with normal active bowel sounds; left lower quadrant tenderness remains but significantly improved. No rebound or guarding Extremities: no clubbing, cyanosis or edema  Discharge  Labs:  Recent Labs  12/06/13 1223 12/07/13 0336  NA 142 138  K 3.7 3.9  CL 101 101  CO2 23 24  GLUCOSE 116* 95  BUN 14 9  CREATININE 0.97 0.98  CALCIUM 9.3 8.4    Recent Labs  12/07/13 0336  AST 13  ALT 16  ALKPHOS 59  BILITOT 0.9  PROT 6.0  ALBUMIN 3.3*    Recent Labs  12/06/13 1223 12/07/13 0336  WBC 8.7 7.1  NEUTROABS 6.6  --   HGB 14.8 13.0  HCT 43.2 38.5*  MCV 85.7 87.5  PLT 210 181   Lactic Acid- 1.3 on admission  Discharge instructions:     Discharge Instructions   Diet general    Complete by:  As directed   High Fiber, low residue diet as tolerated.     Discharge instructions    Complete by:  As directed   Call if increased abdominal pain, tenderness, fever.  Follow-up with MD in [place] as soon as possible to arrange referral to Gastroenterology and General Surgery to consider colonoscopy and possibly surgery.     Increase activity slowly    Complete by:  As directed    Disposition: to home  Follow-up Appts: Follow-up with Dr[facility] Cle[facility]aw [facility] within 1 week prior to move to [place].  Follow-up with new PCP and GI in [place], [place]  Condition on Discharge: Stable, improved  Tests Needing Follow-up: None  Signed: Martha Clan 12/09/2013, 8:05 AM

## 2014-06-18 ENCOUNTER — Encounter (INDEPENDENT_AMBULATORY_CARE_PROVIDER_SITE_OTHER): Payer: Self-pay | Admitting: General Surgery

## 2014-06-18 NOTE — Progress Notes (Signed)
Patient ID: [name], male   DOB: 08/24/1990, 24 y.o.   MRN: [id number]  [name] 06/18/2014 11:22 AM Location: [facility] Patient #: [id number] DOB: 06/11/1990 Single / Language: [place] / Race: White Male  History of Present Illness [name](Casey Fye J. Ejay Lashley MD; 06/18/2014 12:08 PM) Patient words: diverticulitis.  The patient is a 24 year old male   Note:[name] comes in for preoperative visit. He has recurring bouts of sigmoid diverticulitis. He has seen Dr. Derrell Lollingamirez in the office. Colonoscopy was performed by Dr. Bosie ClosSchooler which demonstrated distal descending colon and proximal sigmoid colon diverticulosis as well as a benign polyp in the descending colon. He has not had any symptoms of recurrent diverticulitis at this time. He saw Dr. Derrell Lollingamirez, per Dr. Derrell Lollingamirez has a conflict with respect to scheduling his surgery so he is here to see me and discuss this.   Allergies (Sonya Bynum, CMA; 06/18/2014 11:23 AM) Cephalexin *CEPHALOSPORINS*  Medication History (Sonya Bynum, CMA; 06/18/2014 11:23 AM) Adderall XR (25MG  Capsule ER 24HR, Oral) Active. MiraLax (Oral) Active.  Vitals (Sonya Bynum CMA; 06/18/2014 11:23 AM) 06/18/2014 11:22 AM Weight: 258 lb Height: 73in Body Surface Area: 2.46 m Body Mass Index: 34.04 kg/m Temp.: 97.47F(Temporal)  Pulse: 77 (Regular)  BP: 130/80 (Sitting, Left Arm, Standard)    Physical Exam [name](Starasia Sinko J. Azlaan Isidore MD; 06/18/2014 12:09 PM) The physical exam findings are as follows: Note:General: Overweight male in NAD. Pleasant and cooperative.  HEENT: [place]/AT, no facial masses  CV: RRR, no murmur, no JVD.  CHEST: Breath sounds equal and clear. Respirations nonlabored.  ABDOMEN: Soft, nontender, nondistended, no masses, no organomegaly, active bowel sounds, no scars, no hernias.  ANORECTAL: No fissures. Normal sphincter tone. No masses.  LYMPHATIC: No palpable cervical, supraclavicular, axillary adenopathy.  SKIN: Multiple  small left hand abrasions.  NEUROLOGIC: Alert and oriented, answers questions appropriately, normal gait and station.  PSYCHIATRIC: Normal mood, affect , and behavior.    Assessment & Plan [name](Jahliyah Trice J. Lauree Yurick MD; 06/18/2014 12:11 PM) DIVERTICULITIS, COLON (562.11  K57.32) Impression: He is having recurrent episodes of proximal sigmoid diverticulitis. Partial colectomy has been recommended to him and he would like to proceed with this.  Plan: Laparoscopic assisted partial colectomy. One day bowel prep prior to surgery. I have explained the procedure and risks of colon resection. Risks include but are not limited to bleeding, infection, wound problems, anesthesia, anastomotic leak, need for colostomy, need for reoperative surgery, injury to intraabominal organs (such as intestine, spleen, kidney, bladder, ureter, etc.), ileus, irregular bowel habits. We discussed that some of these risks were higher because he is overweight. He seems to understand and agrees to proceed. Current Plans  Pt Education - [facility] Colon Bowel Prep 2015 Miralax/Antibiotics Started Neomycin Sulfate 500MG , 2 (two) Tablet SEE NOTE, #6, 06/18/2014, No Refill. Local Order: TAKE TWO TABLETS AT 2 PM, 3 PM, AND 10 PM THE DAY PRIOR TO SURGERY Started Flagyl 500MG , 2 (two) Tablet SEE NOTE, #6, 06/18/2014, No Refill. Local Order: Take at 2pm, 3pm, and 10pm the day prior to your colon operation Schedule for Surgery Free Text Instructions : discussed with patient and provided information.   Signed by John Pollackodd J Lilana Blasko, MD (06/18/2014 12:11 PM)

## 2014-06-27 ENCOUNTER — Encounter (HOSPITAL_COMMUNITY)
Admission: RE | Admit: 2014-06-27 | Discharge: 2014-06-27 | Disposition: A | Discharge status: Home / Self Care | Payer: 59 | Source: Ambulatory Visit | Attending: General Surgery | Admitting: General Surgery

## 2014-06-27 ENCOUNTER — Encounter (HOSPITAL_COMMUNITY): Payer: Self-pay

## 2014-06-27 DIAGNOSIS — Z01818 Encounter for other preprocedural examination: Secondary | ICD-10-CM | POA: Insufficient documentation

## 2014-06-27 DIAGNOSIS — K5732 Diverticulitis of large intestine without perforation or abscess without bleeding: Secondary | ICD-10-CM | POA: Insufficient documentation

## 2014-06-27 HISTORY — DX: Hiccough: R06.6

## 2014-06-27 HISTORY — DX: Other specified behavioral and emotional disorders with onset usually occurring in childhood and adolescence: F98.8

## 2014-06-27 LAB — COMPREHENSIVE METABOLIC PANEL
ALBUMIN: 4.2 g/dL (ref 3.5–5.2)
ALT: 28 U/L (ref 0–53)
ANION GAP: 9 (ref 5–15)
AST: 22 U/L (ref 0–37)
Alkaline Phosphatase: 70 U/L (ref 39–117)
BUN: 13 mg/dL (ref 6–23)
CO2: 26 mmol/L (ref 19–32)
CREATININE: 0.84 mg/dL (ref 0.50–1.35)
Calcium: 9.1 mg/dL (ref 8.4–10.5)
Chloride: 102 mmol/L (ref 96–112)
GFR calc non Af Amer: >90 mL/min (ref >90)
Glucose, Bld: 88 mg/dL (ref 70–99)
Potassium: 3.8 mmol/L (ref 3.5–5.1)
Sodium: 137 mmol/L (ref 135–145)
TOTAL PROTEIN: 7.6 g/dL (ref 6.0–8.3)
Total Bilirubin: 0.6 mg/dL (ref 0.3–1.2)

## 2014-06-27 LAB — CBC WITH DIFFERENTIAL/PLATELET
BASOS ABS: 0 10*3/uL (ref 0.0–0.1)
BASOS PCT: 1 % (ref 0–1)
Eosinophils Absolute: 0.1 10*3/uL (ref 0.0–0.7)
Eosinophils Relative: 3 % (ref 0–5)
HEMATOCRIT: 40.3 % (ref 39.0–52.0)
Hemoglobin: 13.5 g/dL (ref 13.0–17.0)
Lymphocytes Relative: 25 % (ref 12–46)
Lymphs Abs: 1.4 10*3/uL (ref 0.7–4.0)
MCH: 28.7 pg (ref 26.0–34.0)
MCHC: 33.5 g/dL (ref 30.0–36.0)
MCV: 85.6 fL (ref 78.0–100.0)
MONO ABS: 0.4 10*3/uL (ref 0.1–1.0)
Monocytes Relative: 7 % (ref 3–12)
Neutro Abs: 3.7 10*3/uL (ref 1.7–7.7)
Neutrophils Relative %: 64 % (ref 43–77)
Platelets: 230 10*3/uL (ref 150–400)
RBC: 4.71 MIL/uL (ref 4.22–5.81)
RDW: 13.1 % (ref 11.5–15.5)
WBC: 5.7 10*3/uL (ref 4.0–10.5)

## 2014-06-27 LAB — ABO/RH: ABO/RH(D): A POS

## 2014-06-27 NOTE — Progress Notes (Signed)
CT abd/pelvis epic 08/22/2013 lungs mentioned

## 2014-06-27 NOTE — Patient Instructions (Signed)
20 [name]  06/27/2014   Your procedure is scheduled on:       Tuesday July 01, 2014  Report [facility] Main [facility]tr[facility] and follow signs to  Short Stay Center arrive at 5:30 AM.   Call this number if you have problems the morning of surgery [phone] or Presurgical Testing [phone].   Remember:  Do not eat food or drink liquids :After Midnight.   Remember: follow any bowel prep instructions per MD office.     Take these medicines the morning of surgery with A SIP OF WATER: NONE                               You may not have any metal on your body including hair pins and piercings  Do not wear jewelry, colognes, lotions, powders, or deodorant.   Men may shave face and neck.               Do not bring valuables to the hospital. [place] [facility] IS NOT RESPONSIBLE FOR VALUABLES.  Contacts, dentures or bridgework may not be worn into surgery.  Leave suitcase in the car. After surgery it may be brought to your room.  For patients admitted to the hospital, checkout time is 11:00 AM the day of discharge.    Remember: Type/Screen "Blue armsbands"- may not be removed once applied(would result in being retested AM of surgery, if removed). ________________________________________________________________________  [facility] - Preparing for [facility] Before surgery, you can play an important role.  Because skin is not sterile, your skin needs to be as free of germs as possible.  You can reduce the number of germs on your skin by washing with CHG (chlorahexidine gluconate) soap before surgery.  CHG is an antiseptic cleaner which kills germs and bonds with the skin to continue killing germs even after washing. Please DO NOT use if you have an allergy to CHG or antibacterial soaps.  If your skin becomes reddened/irritated stop using the CHG and inform your nurse when you arrive at Short Stay. Do not shave (including legs and underarms) for at least 48 hours prior to the first  CHG shower.  You may shave your face/neck. Please follow these instructions carefully:  1.  Shower with CHG Soap the night before surgery and the  morning of Surgery.  2.  If you choose to wash your hair, wash your hair first as usual with your  normal  shampoo.  3.  After you shampoo, rinse your hair and body thoroughly to remove the  shampoo.                           4.  Use CHG as you would any other liquid soap.  You can apply chg directly  to the skin and wash                       Gently with a scrungie or clean washcloth.  5.  Apply the CHG Soap to your body ONLY FROM THE NECK DOWN.   Do not use on face/ open                           Wound or open sores. Avoid contact with eyes, ears mouth and genitals (private parts).  Wash face,  Genitals (private parts) with your normal soap.             6.  Wash thoroughly, paying special attention to the area where your surgery  will be performed.  7.  Thoroughly rinse your body with warm water from the neck down.  8.  DO NOT shower/wash with your normal soap after using and rinsing off  the CHG Soap.                9.  Pat yourself dry with a clean towel.            10.  Wear clean pajamas.            11.  Place clean sheets on your bed the night of your first shower and do not  sleep with pets. Day of Surgery : Do not apply any lotions/deodorants the morning of surgery.  Please wear clean clothes to the hospital/surgery center.  FAILURE TO FOLLOW THESE INSTRUCTIONS MAY RESULT IN THE CANCELLATION OF YOUR SURGERY PATIENT SIGNATURE_________________________________  NURSE SIGNATURE__________________________________  ________________________________________________________________________

## 2014-07-01 ENCOUNTER — Inpatient Hospital Stay (HOSPITAL_COMMUNITY)
Admission: RE | Admit: 2014-07-01 | Discharge: 2014-07-07 | DRG: 330 | Disposition: A | Discharge status: Home / Self Care | Payer: 59 | Source: Ambulatory Visit | Attending: General Surgery | Admitting: General Surgery

## 2014-07-01 ENCOUNTER — Encounter (HOSPITAL_COMMUNITY)
Admission: RE | Disposition: A | Discharge status: Home / Self Care | Payer: Self-pay | Source: Ambulatory Visit | Attending: General Surgery

## 2014-07-01 ENCOUNTER — Encounter (HOSPITAL_COMMUNITY): Payer: Self-pay | Admitting: *Deleted

## 2014-07-01 ENCOUNTER — Inpatient Hospital Stay (HOSPITAL_COMMUNITY): Payer: 59 | Admitting: Registered Nurse

## 2014-07-01 DIAGNOSIS — E663 Overweight: Secondary | ICD-10-CM | POA: Diagnosis present

## 2014-07-01 DIAGNOSIS — Z01812 Encounter for preprocedural laboratory examination: Secondary | ICD-10-CM

## 2014-07-01 DIAGNOSIS — K660 Peritoneal adhesions (postprocedural) (postinfection): Secondary | ICD-10-CM | POA: Diagnosis present

## 2014-07-01 DIAGNOSIS — K5732 Diverticulitis of large intestine without perforation or abscess without bleeding: Secondary | ICD-10-CM | POA: Diagnosis present

## 2014-07-01 DIAGNOSIS — Z6834 Body mass index (BMI) 34.0-34.9, adult: Secondary | ICD-10-CM

## 2014-07-01 DIAGNOSIS — Z79899 Other long term (current) drug therapy: Secondary | ICD-10-CM

## 2014-07-01 DIAGNOSIS — J9811 Atelectasis: Secondary | ICD-10-CM | POA: Diagnosis not present

## 2014-07-01 DIAGNOSIS — K913 Postprocedural intestinal obstruction: Secondary | ICD-10-CM | POA: Diagnosis not present

## 2014-07-01 HISTORY — PX: LAPAROSCOPIC PARTIAL COLECTOMY: SHX5907

## 2014-07-01 LAB — TYPE AND SCREEN
ABO/RH(D): A POS
Antibody Screen: NEGATIVE

## 2014-07-01 SURGERY — LAPAROSCOPIC PARTIAL COLECTOMY
Anesthesia: General | Site: Abdomen

## 2014-07-01 MED ORDER — KETOROLAC TROMETHAMINE 30 MG/ML IJ SOLN
30.0000 mg | Freq: Four times a day (QID) | INTRAMUSCULAR | Status: AC
  Administered 2014-07-01 – 2014-07-02 (×6): 30 mg via INTRAVENOUS
  Filled 2014-07-01 (×6): qty 1

## 2014-07-01 MED ORDER — BUPIVACAINE-EPINEPHRINE (PF) 0.25% -1:200000 IJ SOLN
INTRAMUSCULAR | Status: AC
  Filled 2014-07-01: qty 30

## 2014-07-01 MED ORDER — ONDANSETRON HCL 4 MG/2ML IJ SOLN
INTRAMUSCULAR | Status: AC
  Filled 2014-07-01: qty 2

## 2014-07-01 MED ORDER — ATROPINE SULFATE 0.4 MG/ML IJ SOLN
INTRAMUSCULAR | Status: AC
  Filled 2014-07-01: qty 2

## 2014-07-01 MED ORDER — MORPHINE SULFATE (PF) 1 MG/ML IV SOLN
INTRAVENOUS | Status: AC
  Filled 2014-07-01: qty 25

## 2014-07-01 MED ORDER — NEOSTIGMINE METHYLSULFATE 10 MG/10ML IV SOLN
INTRAVENOUS | Status: DC | PRN
  Administered 2014-07-01: 5 mg via INTRAVENOUS

## 2014-07-01 MED ORDER — MORPHINE SULFATE (PF) 1 MG/ML IV SOLN
INTRAVENOUS | Status: DC
  Administered 2014-07-01: 11:00:00 via INTRAVENOUS
  Administered 2014-07-01 (×3): 3 mg via INTRAVENOUS

## 2014-07-01 MED ORDER — LACTATED RINGERS IV SOLN
INTRAVENOUS | Status: DC | PRN
  Administered 2014-07-01: 07:00:00 via INTRAVENOUS

## 2014-07-01 MED ORDER — PROPOFOL 10 MG/ML IV BOLUS
INTRAVENOUS | Status: AC
  Filled 2014-07-01: qty 20

## 2014-07-01 MED ORDER — HYDROMORPHONE 0.3 MG/ML IV SOLN
INTRAVENOUS | Status: DC
  Administered 2014-07-01: 6.99 mg via INTRAVENOUS
  Administered 2014-07-01: 15:00:00 via INTRAVENOUS
  Administered 2014-07-01: 3.5 mg via INTRAVENOUS
  Administered 2014-07-01: 0.3 mg via INTRAVENOUS
  Administered 2014-07-02: 2.7 mg via INTRAVENOUS
  Administered 2014-07-02: 3.6 mg via INTRAVENOUS
  Administered 2014-07-02: 4.8 mg via INTRAVENOUS
  Administered 2014-07-02: 6.6 mg via INTRAVENOUS
  Administered 2014-07-02: 17:00:00 via INTRAVENOUS
  Administered 2014-07-02: 3.9 mg via INTRAVENOUS
  Administered 2014-07-02: 0.3 mg via INTRAVENOUS
  Administered 2014-07-02: 5.77 mg via INTRAVENOUS
  Administered 2014-07-03: 2.4 mg via INTRAVENOUS
  Administered 2014-07-03: 2.6 mg via INTRAVENOUS
  Administered 2014-07-03: 17:00:00 via INTRAVENOUS
  Administered 2014-07-03: 1.8 mg via INTRAVENOUS
  Administered 2014-07-03: 3 mg via INTRAVENOUS
  Administered 2014-07-03: 8.6 mg via INTRAVENOUS
  Administered 2014-07-03: 3.9 mg via INTRAVENOUS
  Administered 2014-07-04: 7.63 mg via INTRAVENOUS
  Administered 2014-07-04: 3.3 mg via INTRAVENOUS
  Administered 2014-07-04: 1.2 mg via INTRAVENOUS
  Administered 2014-07-04: 7.5 mg via INTRAVENOUS
  Administered 2014-07-04: 1.8 mg via INTRAVENOUS
  Administered 2014-07-04: 5.29 mg via INTRAVENOUS
  Administered 2014-07-04: 13:00:00 via INTRAVENOUS
  Administered 2014-07-05: 4.5 mg via INTRAVENOUS
  Administered 2014-07-05: 5.7 mg via INTRAVENOUS
  Filled 2014-07-01 (×12): qty 25

## 2014-07-01 MED ORDER — HYDROMORPHONE HCL 1 MG/ML IJ SOLN
INTRAMUSCULAR | Status: AC
  Filled 2014-07-01: qty 1

## 2014-07-01 MED ORDER — DIPHENHYDRAMINE HCL 12.5 MG/5ML PO ELIX: 12.5000 mg | ORAL_SOLUTION | Freq: Four times a day (QID) | ORAL | Status: DC | PRN

## 2014-07-01 MED ORDER — LIDOCAINE HCL (CARDIAC) 20 MG/ML IV SOLN
INTRAVENOUS | Status: AC
  Filled 2014-07-01: qty 5

## 2014-07-01 MED ORDER — MIDAZOLAM HCL 2 MG/2ML IJ SOLN
INTRAMUSCULAR | Status: AC
  Filled 2014-07-01: qty 2

## 2014-07-01 MED ORDER — MEPERIDINE HCL 50 MG/ML IJ SOLN
6.2500 mg | INTRAMUSCULAR | Status: DC | PRN
  Administered 2014-07-01 (×2): 12.5 mg via INTRAVENOUS

## 2014-07-01 MED ORDER — CIPROFLOXACIN IN D5W 400 MG/200ML IV SOLN
400.0000 mg | INTRAVENOUS | Status: AC
  Administered 2014-07-01: 400 mg via INTRAVENOUS

## 2014-07-01 MED ORDER — MIDAZOLAM HCL 2 MG/2ML IJ SOLN
0.5000 mg | INTRAMUSCULAR | Status: AC
  Administered 2014-07-01 (×4): 0.5 mg via INTRAVENOUS

## 2014-07-01 MED ORDER — NEOSTIGMINE METHYLSULFATE 10 MG/10ML IV SOLN
INTRAVENOUS | Status: AC
  Filled 2014-07-01: qty 1

## 2014-07-01 MED ORDER — LACTATED RINGERS IR SOLN
Status: DC | PRN
  Administered 2014-07-01: 1000 mL

## 2014-07-01 MED ORDER — 0.9 % SODIUM CHLORIDE (POUR BTL) OPTIME
TOPICAL | Status: DC | PRN
  Administered 2014-07-01: 4000 mL

## 2014-07-01 MED ORDER — PANTOPRAZOLE SODIUM 40 MG IV SOLR
40.0000 mg | INTRAVENOUS | Status: DC
  Administered 2014-07-01 – 2014-07-04 (×4): 40 mg via INTRAVENOUS
  Filled 2014-07-01 (×5): qty 40

## 2014-07-01 MED ORDER — DIPHENHYDRAMINE HCL 50 MG/ML IJ SOLN
12.5000 mg | Freq: Four times a day (QID) | INTRAMUSCULAR | Status: DC | PRN
  Administered 2014-07-01: 12.5 mg via INTRAVENOUS

## 2014-07-01 MED ORDER — ONDANSETRON HCL 4 MG/2ML IJ SOLN: 4.0000 mg | Freq: Once | INTRAMUSCULAR | Status: DC | PRN

## 2014-07-01 MED ORDER — FENTANYL CITRATE 0.05 MG/ML IJ SOLN
INTRAMUSCULAR | Status: AC
  Filled 2014-07-01: qty 5

## 2014-07-01 MED ORDER — HYDROMORPHONE HCL 2 MG/ML IJ SOLN
INTRAMUSCULAR | Status: AC
  Filled 2014-07-01: qty 1

## 2014-07-01 MED ORDER — LACTATED RINGERS IV SOLN
INTRAVENOUS | Status: DC
  Administered 2014-07-01: 12:00:00 via INTRAVENOUS

## 2014-07-01 MED ORDER — METRONIDAZOLE IN NACL 5-0.79 MG/ML-% IV SOLN
500.0000 mg | Freq: Once | INTRAVENOUS | Status: AC
  Administered 2014-07-01: 500 mg via INTRAVENOUS

## 2014-07-01 MED ORDER — SODIUM CHLORIDE 0.9 % IJ SOLN
INTRAMUSCULAR | Status: AC
  Filled 2014-07-01: qty 10

## 2014-07-01 MED ORDER — FENTANYL CITRATE 0.05 MG/ML IJ SOLN
INTRAMUSCULAR | Status: DC | PRN
  Administered 2014-07-01 (×7): 50 ug via INTRAVENOUS

## 2014-07-01 MED ORDER — HYDROMORPHONE HCL 1 MG/ML IJ SOLN
INTRAMUSCULAR | Status: DC | PRN
  Administered 2014-07-01 (×4): 0.5 mg via INTRAVENOUS

## 2014-07-01 MED ORDER — DIPHENHYDRAMINE HCL 12.5 MG/5ML PO ELIX
12.5000 mg | ORAL_SOLUTION | Freq: Four times a day (QID) | ORAL | Status: DC | PRN
  Filled 2014-07-01: qty 5

## 2014-07-01 MED ORDER — ALVIMOPAN 12 MG PO CAPS: 12.0000 mg | ORAL_CAPSULE | Freq: Two times a day (BID) | ORAL | Status: DC

## 2014-07-01 MED ORDER — NALOXONE HCL 0.4 MG/ML IJ SOLN: 0.4000 mg | INTRAMUSCULAR | Status: DC | PRN

## 2014-07-01 MED ORDER — MEPERIDINE HCL 50 MG/ML IJ SOLN
INTRAMUSCULAR | Status: AC
  Filled 2014-07-01: qty 1

## 2014-07-01 MED ORDER — PROPOFOL 10 MG/ML IV BOLUS
INTRAVENOUS | Status: DC | PRN
  Administered 2014-07-01: 250 mg via INTRAVENOUS

## 2014-07-01 MED ORDER — DIPHENHYDRAMINE HCL 50 MG/ML IJ SOLN: 12.5000 mg | Freq: Four times a day (QID) | INTRAMUSCULAR | Status: DC | PRN

## 2014-07-01 MED ORDER — EPHEDRINE SULFATE 50 MG/ML IJ SOLN
INTRAMUSCULAR | Status: AC
  Filled 2014-07-01: qty 1

## 2014-07-01 MED ORDER — LIDOCAINE HCL (CARDIAC) 20 MG/ML IV SOLN
INTRAVENOUS | Status: DC | PRN
  Administered 2014-07-01: 100 mg via INTRAVENOUS

## 2014-07-01 MED ORDER — SODIUM CHLORIDE 0.9 % IJ SOLN: 9.0000 mL | INTRAMUSCULAR | Status: DC | PRN

## 2014-07-01 MED ORDER — ROCURONIUM BROMIDE 100 MG/10ML IV SOLN
INTRAVENOUS | Status: DC | PRN
  Administered 2014-07-01: 5 mg via INTRAVENOUS
  Administered 2014-07-01: 60 mg via INTRAVENOUS
  Administered 2014-07-01: 5 mg via INTRAVENOUS
  Administered 2014-07-01: 10 mg via INTRAVENOUS
  Administered 2014-07-01: 5 mg via INTRAVENOUS

## 2014-07-01 MED ORDER — KCL-LACTATED RINGERS-D5W 20 MEQ/L IV SOLN
INTRAVENOUS | Status: DC
  Administered 2014-07-01 – 2014-07-02 (×2): via INTRAVENOUS
  Administered 2014-07-02: 1000 mL via INTRAVENOUS
  Administered 2014-07-02 – 2014-07-03 (×2): 125 mL/h via INTRAVENOUS
  Administered 2014-07-03 – 2014-07-05 (×5): via INTRAVENOUS
  Filled 2014-07-01 (×21): qty 1000

## 2014-07-01 MED ORDER — DIPHENHYDRAMINE HCL 50 MG/ML IJ SOLN
INTRAMUSCULAR | Status: AC
  Filled 2014-07-01: qty 1

## 2014-07-01 MED ORDER — FENTANYL CITRATE 0.05 MG/ML IJ SOLN
INTRAMUSCULAR | Status: AC
Start: 2014-07-01 — End: 2014-07-01
  Filled 2014-07-01: qty 2

## 2014-07-01 MED ORDER — HYDROMORPHONE HCL 1 MG/ML IJ SOLN
0.2500 mg | INTRAMUSCULAR | Status: DC | PRN
  Administered 2014-07-01 (×6): 0.5 mg via INTRAVENOUS

## 2014-07-01 MED ORDER — ONDANSETRON HCL 4 MG/2ML IJ SOLN: 4.0000 mg | Freq: Four times a day (QID) | INTRAMUSCULAR | Status: DC | PRN

## 2014-07-01 MED ORDER — CIPROFLOXACIN IN D5W 400 MG/200ML IV SOLN
INTRAVENOUS | Status: AC
  Filled 2014-07-01: qty 200

## 2014-07-01 MED ORDER — ONDANSETRON HCL 4 MG/2ML IJ SOLN
INTRAMUSCULAR | Status: DC | PRN
  Administered 2014-07-01: 4 mg via INTRAVENOUS

## 2014-07-01 MED ORDER — GLYCOPYRROLATE 0.2 MG/ML IJ SOLN
INTRAMUSCULAR | Status: AC
  Filled 2014-07-01: qty 4

## 2014-07-01 MED ORDER — LIP MEDEX EX OINT
TOPICAL_OINTMENT | CUTANEOUS | Status: AC
  Filled 2014-07-01: qty 7

## 2014-07-01 MED ORDER — MIDAZOLAM HCL 5 MG/5ML IJ SOLN
INTRAMUSCULAR | Status: DC | PRN
  Administered 2014-07-01: 2 mg via INTRAVENOUS

## 2014-07-01 MED ORDER — BUPIVACAINE-EPINEPHRINE (PF) 0.25% -1:200000 IJ SOLN
INTRAMUSCULAR | Status: DC | PRN
  Administered 2014-07-01: 13 mL

## 2014-07-01 MED ORDER — ONDANSETRON HCL 4 MG/2ML IJ SOLN
4.0000 mg | Freq: Four times a day (QID) | INTRAMUSCULAR | Status: DC | PRN
Start: 2014-07-01 — End: 2014-07-05
  Administered 2014-07-02 – 2014-07-04 (×3): 4 mg via INTRAVENOUS
  Filled 2014-07-01 (×2): qty 2

## 2014-07-01 MED ORDER — HYDROMORPHONE HCL 1 MG/ML IJ SOLN
0.2500 mg | INTRAMUSCULAR | Status: DC | PRN
  Administered 2014-07-01 (×2): 0.5 mg via INTRAVENOUS

## 2014-07-01 MED ORDER — ONDANSETRON HCL 4 MG/2ML IJ SOLN
4.0000 mg | INTRAMUSCULAR | Status: DC | PRN
  Administered 2014-07-03 – 2014-07-04 (×2): 4 mg via INTRAVENOUS
  Filled 2014-07-01 (×3): qty 2

## 2014-07-01 MED ORDER — HEPARIN SODIUM (PORCINE) 5000 UNIT/ML IJ SOLN
5000.0000 [IU] | Freq: Three times a day (TID) | INTRAMUSCULAR | Status: DC
  Administered 2014-07-02 – 2014-07-07 (×16): 5000 [IU] via SUBCUTANEOUS
  Filled 2014-07-01 (×19): qty 1

## 2014-07-01 MED ORDER — ROCURONIUM BROMIDE 100 MG/10ML IV SOLN
INTRAVENOUS | Status: AC
  Filled 2014-07-01: qty 1

## 2014-07-01 MED ORDER — ONDANSETRON HCL 4 MG PO TABS
4.0000 mg | ORAL_TABLET | Freq: Four times a day (QID) | ORAL | Status: DC | PRN
  Filled 2014-07-01: qty 1

## 2014-07-01 MED ORDER — METRONIDAZOLE IN NACL 5-0.79 MG/ML-% IV SOLN
INTRAVENOUS | Status: AC
  Filled 2014-07-01: qty 100

## 2014-07-01 MED ORDER — HYDROMORPHONE HCL 1 MG/ML IJ SOLN
INTRAMUSCULAR | Status: AC
  Filled 2014-07-01: qty 2

## 2014-07-01 MED ORDER — GLYCOPYRROLATE 0.2 MG/ML IJ SOLN
INTRAMUSCULAR | Status: DC | PRN
Start: 2014-07-01 — End: 2014-07-01
  Administered 2014-07-01: .8 mg via INTRAVENOUS

## 2014-07-01 SURGICAL SUPPLY — 77 items
APPLIER CLIP 5 13 M/L LIGAMAX5 (MISCELLANEOUS)
APPLIER CLIP ROT 10 11.4 M/L (STAPLE)
BLADE EXTENDED COATED 6.5IN (ELECTRODE) IMPLANT
BLADE HEX COATED 2.75 (ELECTRODE) ×3 IMPLANT
CABLE HIGH FREQUENCY MONO STRZ (ELECTRODE) ×3 IMPLANT
CELLS DAT CNTRL 66122 CELL SVR (MISCELLANEOUS) IMPLANT
CLIP APPLIE 5 13 M/L LIGAMAX5 (MISCELLANEOUS) IMPLANT
CLIP APPLIE ROT 10 11.4 M/L (STAPLE) IMPLANT
COUNTER NEEDLE 20 DBL MAG RED (NEEDLE) ×3 IMPLANT
COVER MAYO STAND STRL (DRAPES) ×3 IMPLANT
DECANTER SPIKE VIAL GLASS SM (MISCELLANEOUS) ×3 IMPLANT
DISSECTOR BLUNT TIP ENDO 5MM (MISCELLANEOUS) IMPLANT
DRAIN CHANNEL 19F RND (DRAIN) IMPLANT
DRAPE LAPAROSCOPIC ABDOMINAL (DRAPES) ×3 IMPLANT
DRAPE UTILITY XL STRL (DRAPES) ×6 IMPLANT
DRSG OPSITE POSTOP 4X10 (GAUZE/BANDAGES/DRESSINGS) IMPLANT
DRSG OPSITE POSTOP 4X6 (GAUZE/BANDAGES/DRESSINGS) IMPLANT
DRSG OPSITE POSTOP 4X8 (GAUZE/BANDAGES/DRESSINGS) IMPLANT
ELECT BLADE TIP CTD 4 INCH (ELECTRODE) ×3 IMPLANT
ELECT PENCIL ROCKER SW 15FT (MISCELLANEOUS) ×6 IMPLANT
ELECT REM PT RETURN 9FT ADLT (ELECTROSURGICAL) ×3
ELECTRODE REM PT RTRN 9FT ADLT (ELECTROSURGICAL) ×1 IMPLANT
EVACUATOR SILICONE 100CC (DRAIN) IMPLANT
FILTER SMOKE EVAC LAPAROSHD (FILTER) IMPLANT
GAUZE SPONGE 4X4 12PLY STRL (GAUZE/BANDAGES/DRESSINGS) ×3 IMPLANT
GLOVE BIO SURGEON STRL SZ7 (GLOVE) ×3 IMPLANT
GLOVE BIOGEL PI IND STRL 6.5 (GLOVE) ×3 IMPLANT
GLOVE BIOGEL PI IND STRL 7.0 (GLOVE) ×1 IMPLANT
GLOVE BIOGEL PI IND STRL 7.5 (GLOVE) ×3 IMPLANT
GLOVE BIOGEL PI INDICATOR 6.5 (GLOVE) ×6
GLOVE BIOGEL PI INDICATOR 7.0 (GLOVE) ×2
GLOVE BIOGEL PI INDICATOR 7.5 (GLOVE) ×6
GLOVE ECLIPSE 8.0 STRL XLNG CF (GLOVE) ×6 IMPLANT
GLOVE INDICATOR 8.0 STRL GRN (GLOVE) ×6 IMPLANT
GLOVE SURG SS PI 6.5 STRL IVOR (GLOVE) ×15 IMPLANT
GOWN STRL REUS W/TWL XL LVL3 (GOWN DISPOSABLE) ×12 IMPLANT
LEGGING LITHOTOMY PAIR STRL (DRAPES) ×3 IMPLANT
LIGASURE IMPACT 36 18CM CVD LR (INSTRUMENTS) IMPLANT
MANIFOLD NEPTUNE II (INSTRUMENTS) ×3 IMPLANT
PACK COLON (CUSTOM PROCEDURE TRAY) ×3 IMPLANT
RELOAD PROXIMATE 75MM BLUE (ENDOMECHANICALS) ×6 IMPLANT
RTRCTR WOUND ALEXIS 18CM MED (MISCELLANEOUS)
SCISSORS LAP 5X35 DISP (ENDOMECHANICALS) ×3 IMPLANT
SET IRRIG TUBING LAPAROSCOPIC (IRRIGATION / IRRIGATOR) IMPLANT
SHEARS CURVED HARMONIC AC 45CM (MISCELLANEOUS) ×3 IMPLANT
SHEARS HARMONIC ACE PLUS 36CM (ENDOMECHANICALS) IMPLANT
SLEEVE XCEL OPT CAN 5 100 (ENDOMECHANICALS) ×9 IMPLANT
SOLUTION ANTI FOG 6CC (MISCELLANEOUS) ×3 IMPLANT
SPONGE DRAIN TRACH 4X4 STRL 2S (GAUZE/BANDAGES/DRESSINGS) ×3 IMPLANT
SPONGE LAP 18X18 X RAY DECT (DISPOSABLE) ×3 IMPLANT
STAPLER CIRC CVD 29MM 37CM (STAPLE) ×3 IMPLANT
STAPLER PROXIMATE 75MM BLUE (STAPLE) ×3 IMPLANT
STAPLER VISISTAT 35W (STAPLE) ×3 IMPLANT
SUCTION POOLE TIP (SUCTIONS) ×6 IMPLANT
SUT ETHILON 2 0 PS N (SUTURE) IMPLANT
SUT PDS AB 1 CTX 36 (SUTURE) IMPLANT
SUT PDS AB 1 TP1 96 (SUTURE) ×6 IMPLANT
SUT PROLENE 2 0 KS (SUTURE) IMPLANT
SUT PROLENE 2 0 SH DA (SUTURE) ×3 IMPLANT
SUT SILK 2 0 (SUTURE) ×2
SUT SILK 2 0 SH CR/8 (SUTURE) ×3 IMPLANT
SUT SILK 2-0 18XBRD TIE 12 (SUTURE) ×1 IMPLANT
SUT SILK 3 0 (SUTURE) ×2
SUT SILK 3 0 SH CR/8 (SUTURE) ×3 IMPLANT
SUT SILK 3-0 18XBRD TIE 12 (SUTURE) ×1 IMPLANT
SUT VICRYL 2 0 18  UND BR (SUTURE) ×2
SUT VICRYL 2 0 18 UND BR (SUTURE) ×1 IMPLANT
SYS LAPSCP GELPORT 120MM (MISCELLANEOUS)
SYSTEM LAPSCP GELPORT 120MM (MISCELLANEOUS) IMPLANT
TOWEL OR 17X26 10 PK STRL BLUE (TOWEL DISPOSABLE) ×6 IMPLANT
TOWEL OR NON WOVEN STRL DISP B (DISPOSABLE) ×6 IMPLANT
TRAY FOLEY CATH 14FRSI W/METER (CATHETERS) IMPLANT
TRAY FOLEY CATH 16FRSI W/METER (SET/KITS/TRAYS/PACK) ×3 IMPLANT
TROCAR BLADELESS OPT 5 100 (ENDOMECHANICALS) ×3 IMPLANT
TROCAR XCEL BLUNT TIP 100MML (ENDOMECHANICALS) IMPLANT
TROCAR XCEL NON-BLD 11X100MML (ENDOMECHANICALS) IMPLANT
TUBING INSUFFLATION 10FT LAP (TUBING) ×6 IMPLANT

## 2014-07-01 NOTE — Anesthesia Preprocedure Evaluation (Addendum)
Anesthesia Evaluation  Patient identified by MRN, date of birth, ID band Patient awake    Reviewed: Allergy & Precautions, NPO status , Patient's Chart, lab work & pertinent test results  History of Anesthesia Complications (+) AWARENESS UNDER ANESTHESIA  Airway        Dental   Pulmonary          Cardiovascular     Neuro/Psych    GI/Hepatic   Endo/Other  Morbid obesity  Renal/GU      Musculoskeletal   Abdominal   Peds  Hematology   Anesthesia Other Findings   Reproductive/Obstetrics                            Anesthesia Physical Anesthesia Plan  ASA: II  Anesthesia Plan: General   Post-op Pain Management:    Induction: Intravenous  Airway Management Planned: Oral ETT  Additional Equipment:   Intra-op Plan:   Post-operative Plan: Extubation in OR  Informed Consent: I have reviewed the patients History and Physical, chart, labs and discussed the procedure including the risks, benefits and alternatives for the proposed anesthesia with the patient or authorized representative who has indicated his/her understanding and acceptance.     Plan Discussed with: CRNA, Anesthesiologist and Surgeon  Anesthesia Plan Comments:        Anesthesia Quick Evaluation

## 2014-07-01 NOTE — Op Note (Signed)
Operative Note  [name] male 24 y.o. 07/01/2014  PREOPERATIVE DX:  Recurrent Sigmoid Diverticulitis  POSTOPERATIVE DX:  Same  PROCEDURE:   Laparoscopic assisted sigmoid colectomy with mobilization of splenic flexure         Surgeon: Adolph PollackOSENBOWER,[name]   Assistants: John RuddMatthew Tsuei, MD  Anesthesia: General endotracheal anesthesia  Indications:   This 24 year old male with multiple bouts of sigmoid diverticulitis. Colonoscopy demonstrates diverticular disease in the sigmoid colon. He now presents for the above procedure.    Procedure Detail:  He was brought to the operating room placed supine on the operating table and a general anesthetic was administered. He was placed in the lithotomy position. Hair on the abdominal wall was clipped. A Foley catheter was inserted. An oral gastric tube was inserted.  The abdominal wall and pelvic areas were widely sterilely prepped and draped.  He was placed in slight reverse Trendelenburg position. A 5 mm incision was made in the left upper quadrant subcostal area.  Using a 5 mm Optiview trocar and laparoscope access was gained to the peritoneal cavity and a pneumoperitoneum was created by insufflation of carbon dioxide gas.  Inspection of the area under the trocar demonstrated no evidence of bleeding or organ injury. A 5 mm trocar placed in the supraumbilical area. A 5 mm trocar was placed in the right lower quadrant. A 5 mm trocar was placed in the lower midline.  Eventually, a 5 mm trocar was also placed in the left lower quadrant.  The diseased segment of sigmoid colon was identified with inflammatory adhesions to the lateral abdominal wall. This was partially mobilized sharply. The lateral attachments of the descending colon were divided sharply and using electrocautery. Using electrocautery and the Harmonic scalpel the splenic flexure was mobilized. The sigmoid colon distal to the area of the pathology was then mobilized down to the rectosigmoid  junction. The rectosigmoid junction was mobile and was able to be easily brought up out of the pelvis. At the site of the lower midline 5 mm trocar, a limited lower midline incision was made and the GelPort was inserted. Using some hand assistance further mobilization of the splenic flexure and left colon was performed. The sigmoid colon as well as proximal rectum and distal descending colon was unable to be exteriorized due to the limited lower midline incision.  Just distal to the rectosigmoid junction, the rectum was divided with the linear cutting stapler. At the descending colon sigmoid colon junction was soft colon was noted, the colon was also divided with the linear cutting stapler. The mesentery was divided close to the colon with the LigaSure. Dissection was kept anterior to the region of the left ureter. The distal portion of the sigmoid colon was marked with a suture and this was passed off the field.  An EEA sizer was able to pass through the anus up into the rectum easily. The descending colon staple line was removed and a size 29 EEA anvil was placed into the descending colon. The descending colon  defect was as then closed with a linear cutting stapler. The anvil was brought out the side of the descending colon and anchored into position with a 2-0 Prolene pursestring suture. The 29 EEA handle was then introduced through the anus and then end rectum to side descending colon anastomosis was created. 2 complete donuts were noted. The air leak test was performed and there was no evidence of air leak.  The anastomosis was patent, viable, and under no tension.  The  abdominal cavity was copious irrigated with saline solution. No bleeding was noted. A size 19 Blake drain was placed through the left lower quadrant trocar incision and positioned into the pelvis. It was anchored to the skin with 3-0 nylon suture.  Following this, the fascia of the limited lower midline incision was closed with running  double looped #1 PDS. Laparoscopy was then performed in a 4 quadrant inspection was done. There is no evidence of organ injury or bleeding. The fascial closure was solid. The remaining trocars were removed.  The skin of all incisions was closed with staples. Before final closure, needle sponge and his counts were reported to be correct. Sterile dressings were then applied to all wounds.  He tolerated the procedure well without any apparent complications and was taken to the recovery room in satisfactory condition.  Estimated Blood Loss:  400 ml         Drains: #19 Blake drain         Specimens: Sigmoid        Complications:  * No complications entered in OR log *         Disposition: PACU - hemodynamically stable.         Condition: stable

## 2014-07-01 NOTE — Interval H&P Note (Signed)
History and Physical Interval Note:  07/01/2014 7:29 AM  John SparEthan Wojnarowski  has presented today for surgery, with the diagnosis of RECURRENT SIGMOID DIVERTICULITIS  The various methods of treatment have been discussed with the patient and family. After consideration of risks, benefits and other options for treatment, the patient has consented to  Procedure(s): LAPAROSCOPIC ASSISTED PARTIAL COLECTOMY (N/A) as a surgical intervention .  The patient's history has been reviewed, patient examined, no change in status, stable for surgery.  I have reviewed the patient's chart and labs.  Questions were answered to the patient's satisfaction.     Maya Scholer JShela Commons

## 2014-07-01 NOTE — Anesthesia Postprocedure Evaluation (Signed)
  Anesthesia Post-op Note  Patient: [name]  Procedure(s) Performed: Procedure(s): LAPAROSCOPIC ASSISTED SIGMOID COLECTOMY WITH MOBILIZATION OF SPLENIC FLEXURE (N/A)  Patient Location: PACU  Anesthesia Type:General  Level of Consciousness: awake, oriented, sedated and patient cooperative  Airway and Oxygen Therapy: Patient Spontanous Breathing  Post-op Pain: mild  Post-op Assessment: Post-op Vital signs reviewed, Patient's Cardiovascular Status Stable, Respiratory Function Stable, Patent Airway, No signs of Nausea or vomiting and Pain level controlled  Post-op Vital Signs: stable  Last Vitals:  Filed Vitals:   07/01/14 1431  BP: 120/55  Pulse: 69  Temp: 36.5 C  Resp: 16    Complications: No apparent anesthesia complications

## 2014-07-01 NOTE — Progress Notes (Signed)
Progress note:  Called by the nurse.  Pt says Morphine is giving him no relief.  Asking for dilaudid.  I have switched his PCA from Morphine to dilaudid.  I see no history of prior issues with drugs or interaction with Adderall.  Prior to Admission medications   Medication Sig Start Date End Date Taking? Authorizing Provider  amphetamine-dextroamphetamine (ADDERALL XR) 25 MG 24 hr capsule Take 25 mg by mouth every morning.   Yes Historical Provider, MD  Aspirin-Acetaminophen-Caffeine (GOODY HEADACHE PO) Take 1 packet by mouth every 6 (six) hours as needed (for headache).    Historical Provider, MD  oxyCODONE-acetaminophen (PERCOCET/ROXICET) 5-325 MG per tablet Take 1-2 tablets by mouth every 4 (four) hours as needed for moderate pain or severe pain. Patient not taking: Reported on 06/25/2014 12/09/13   Martha ClanWilliam Shaw, MD

## 2014-07-01 NOTE — Transfer of Care (Signed)
Immediate Anesthesia Transfer of Care Note  Patient: [name]  Procedure(s) Performed: Procedure(s): LAPAROSCOPIC ASSISTED SIGMOID COLECTOMY WITH MOBILIZATION OF SPLENIC FLEXURE (N/A)  Patient Location: PACU  Anesthesia Type:General  Level of Consciousness: awake, alert , oriented and patient cooperative  Airway & Oxygen Therapy: Patient Spontanous Breathing and Patient connected to face mask oxygen  Post-op Assessment: Report given to RN, Post -op Vital signs reviewed and stable and Patient moving all extremities  Post vital signs: Reviewed and stable  Last Vitals:  Filed Vitals:   07/01/14 0541  BP: 140/84  Pulse: 77  Temp: 36.6 C  Resp: 18    Complications: No apparent anesthesia complications

## 2014-07-01 NOTE — Anesthesia Procedure Notes (Signed)
Procedure Name: Intubation Date/Time: 07/01/2014 7:40 AM Performed by: Jarvis NewcomerARMISTEAD, Sadeen Wiegel A Pre-anesthesia Checklist: Patient identified, Timeout performed, Emergency Drugs available, Suction available and Patient being monitored Patient Re-evaluated:Patient Re-evaluated prior to inductionOxygen Delivery Method: Circle system utilized Preoxygenation: Pre-oxygenation with 100% oxygen Intubation Type: IV induction Ventilation: Mask ventilation without difficulty Laryngoscope Size: Mac and 4 Grade View: Grade I Tube type: Oral Tube size: 7.5 mm Number of attempts: 1 Airway Equipment and Method: Stylet Placement Confirmation: breath sounds checked- equal and bilateral,  ETT inserted through vocal cords under direct vision and positive ETCO2 Secured at: 22 cm Tube secured with: Tape Dental Injury: Teeth and Oropharynx as per pre-operative assessment

## 2014-07-01 NOTE — Progress Notes (Signed)
Tax inspectorntereg Policy Post op  A/P: Note that patient did not receive Entereg pre-operatively so per P&T policy, patient is unable to start this medication post-op and the medication order has been discontinued.  Hessie KnowsJustin M Sabriel Borromeo, PharmD, BCPS Pager [phone] 07/01/2014 12:46 PM

## 2014-07-01 NOTE — H&P (View-Only) (Signed)
Patient ID: [name], male   DOB: 08/29/1990, 24 y.o.   MRN: [id number]  John Sparthan Barkalow 06/18/2014 11:22 AM Location: [facility] Patient #: [id number] DOB: 06/03/1990 Single / Language: [place] Ponds[place] / Race: White Male  History of Present Illness Adolph Pollack(Pernell Lenoir J. Avrielle Fry MD; 06/18/2014 12:08 PM) Patient words: diverticulitis.  The patient is a 24 year old male   Note:Mr. John RileyMitchell comes in for preoperative visit. He has recurring bouts of sigmoid diverticulitis. He has seen Dr. Derrell Lollingamirez in the office. Colonoscopy was performed by Dr. Bosie ClosSchooler which demonstrated distal descending colon and proximal sigmoid colon diverticulosis as well as a benign polyp in the descending colon. He has not had any symptoms of recurrent diverticulitis at this time. He saw Dr. Derrell Lollingamirez, per Dr. Derrell Lollingamirez has a conflict with respect to scheduling his surgery so he is here to see me and discuss this.   Allergies (Sonya Bynum, CMA; 06/18/2014 11:23 AM) Cephalexin *CEPHALOSPORINS*  Medication History (Sonya Bynum, CMA; 06/18/2014 11:23 AM) Adderall XR (25MG  Capsule ER 24HR, Oral) Active. MiraLax (Oral) Active.  Vitals (Sonya Bynum CMA; 06/18/2014 11:23 AM) 06/18/2014 11:22 AM Weight: 258 lb Height: 73in Body Surface Area: 2.46 m Body Mass Index: 34.04 kg/m Temp.: 97.4F(Temporal)  Pulse: 77 (Regular)  BP: 130/80 (Sitting, Left Arm, Standard)    Physical Exam Adolph Pollack(Wynter Isaacs J. Chloie Loney MD; 06/18/2014 12:09 PM) The physical exam findings are as follows: Note:General: Overweight male in NAD. Pleasant and cooperative.  HEENT: [place]/AT, no facial masses  CV: RRR, no murmur, no JVD.  CHEST: Breath sounds equal and clear. Respirations nonlabored.  ABDOMEN: Soft, nontender, nondistended, no masses, no organomegaly, active bowel sounds, no scars, no hernias.  ANORECTAL: No fissures. Normal sphincter tone. No masses.  LYMPHATIC: No palpable cervical, supraclavicular, axillary adenopathy.  SKIN: Multiple  small left hand abrasions.  NEUROLOGIC: Alert and oriented, answers questions appropriately, normal gait and station.  PSYCHIATRIC: Normal mood, affect , and behavior.    Assessment & Plan Adolph Pollack(Dinorah Masullo J. Daney Moor MD; 06/18/2014 12:11 PM) DIVERTICULITIS, COLON (562.11  K57.32) Impression: He is having recurrent episodes of proximal sigmoid diverticulitis. Partial colectomy has been recommended to him and he would like to proceed with this.  Plan: Laparoscopic assisted partial colectomy. One day bowel prep prior to surgery. I have explained the procedure and risks of colon resection. Risks include but are not limited to bleeding, infection, wound problems, anesthesia, anastomotic leak, need for colostomy, need for reoperative surgery, injury to intraabominal organs (such as intestine, spleen, kidney, bladder, ureter, etc.), ileus, irregular bowel habits. We discussed that some of these risks were higher because he is overweight. He seems to understand and agrees to proceed. Current Plans  Pt Education - [facility] Colon Bowel Prep 2015 Miralax/Antibiotics Started Neomycin Sulfate 500MG , 2 (two) Tablet SEE NOTE, #6, 06/18/2014, No Refill. Local Order: TAKE TWO TABLETS AT 2 PM, 3 PM, AND 10 PM THE DAY PRIOR TO SURGERY Started Flagyl 500MG , 2 (two) Tablet SEE NOTE, #6, 06/18/2014, No Refill. Local Order: Take at 2pm, 3pm, and 10pm the day prior to your colon operation Schedule for Surgery Free Text Instructions : discussed with patient and provided information.   Signed by Adolph Pollackodd J Eliot Popper, MD (06/18/2014 12:11 PM)

## 2014-07-02 ENCOUNTER — Encounter (HOSPITAL_COMMUNITY): Payer: Self-pay | Admitting: General Surgery

## 2014-07-02 LAB — CBC
HCT: 34.5 % — ABNORMAL LOW (ref 39.0–52.0)
HEMOGLOBIN: 11.5 g/dL — AB (ref 13.0–17.0)
MCH: 28.8 pg (ref 26.0–34.0)
MCHC: 33.3 g/dL (ref 30.0–36.0)
MCV: 86.5 fL (ref 78.0–100.0)
Platelets: 180 10*3/uL (ref 150–400)
RBC: 3.99 MIL/uL — AB (ref 4.22–5.81)
RDW: 13.2 % (ref 11.5–15.5)
WBC: 4.5 10*3/uL (ref 4.0–10.5)

## 2014-07-02 LAB — BASIC METABOLIC PANEL
Anion gap: 3 — ABNORMAL LOW (ref 5–15)
BUN: 10 mg/dL (ref 6–23)
CO2: 31 mmol/L (ref 19–32)
Calcium: 8.3 mg/dL — ABNORMAL LOW (ref 8.4–10.5)
Chloride: 103 mmol/L (ref 96–112)
Creatinine, Ser: 0.95 mg/dL (ref 0.50–1.35)
GFR calc Af Amer: >90 mL/min (ref >90)
GFR calc non Af Amer: >90 mL/min (ref >90)
GLUCOSE: 96 mg/dL (ref 70–99)
POTASSIUM: 3.7 mmol/L (ref 3.5–5.1)
SODIUM: 137 mmol/L (ref 135–145)

## 2014-07-02 NOTE — Care Management Note (Signed)
    Page 1 of 1   07/02/2014     9:58:38 AM CARE MANAGEMENT NOTE 07/02/2014  Patient:  Benetta SparMITCHELL,Mccall   Account Number:  [id number]  Date Initiated:  07/02/2014  Documentation initiated by:  Lorenda IshiharaPEELE,Areliz Rothman  Subjective/Objective Assessment:   24 yo male admitted s/p colectomy. PTA lived at home with mother.     Action/Plan:   Home when stable   Anticipated DC Date:  07/05/2014   Anticipated DC Plan:  HOME/SELF CARE      DC Planning Services  CM consult      Choice offered to / List presented to:             Status of service:  Completed, signed off Medicare Important Message given?   (If response is "NO", the following Medicare IM given date fields will be blank) Date Medicare IM given:   Medicare IM given by:   Date Additional Medicare IM given:   Additional Medicare IM given by:    Discharge Disposition:  HOME/SELF CARE  Per UR Regulation:  Reviewed for med. necessity/level of care/duration of stay  If discussed at Long Length of Stay Meetings, dates discussed:    Comments:

## 2014-07-02 NOTE — Progress Notes (Signed)
1 Day Post-Op  Subjective: Very sore.  Has stood up.  No nausea.  Objective: Vital signs in last 24 hours: Temp:  [97.5 F (36.4 C)-99.1 F (37.3 C)] 98 F (36.7 C) (02/17 0509) Pulse Rate:  [53-95] 54 (02/17 0509) Resp:  [10-24] 13 (02/17 0743) BP: (104-155)/(46-100) 104/56 mmHg (02/17 0509) SpO2:  [97 %-100 %] 100 % (02/17 0743) Last BM Date: 06/30/14  Intake/Output from previous day: 02/16 0701 - 02/17 0700 In: 6516.1 [P.O.:60; I.V.:6456.1] Out: 1900 [Urine:1205; Drains:295; Blood:400] Intake/Output this shift:    PE: General- In NAD Abdomen-soft, hypoactive bowel sounds, dressings dry, thin serosanguinous drain output  Lab Results:   Recent Labs  07/02/14 0541  WBC 4.5  HGB 11.5*  HCT 34.5*  PLT 180   BMET  Recent Labs  07/02/14 0541  NA 137  K 3.7  CL 103  CO2 31  GLUCOSE 96  BUN 10  CREATININE 0.95  CALCIUM 8.3*   PT/INR No results for input(s): LABPROT, INR in the last 72 hours. Comprehensive Metabolic Panel:    Component Value Date/Time   NA 137 07/02/2014 0541   NA 137 06/27/2014 0924   K 3.7 07/02/2014 0541   K 3.8 06/27/2014 0924   CL 103 07/02/2014 0541   CL 102 06/27/2014 0924   CO2 31 07/02/2014 0541   CO2 26 06/27/2014 0924   BUN 10 07/02/2014 0541   BUN 13 06/27/2014 0924   CREATININE 0.95 07/02/2014 0541   CREATININE 0.84 06/27/2014 0924   GLUCOSE 96 07/02/2014 0541   GLUCOSE 88 06/27/2014 0924   CALCIUM 8.3* 07/02/2014 0541   CALCIUM 9.1 06/27/2014 0924   AST 22 06/27/2014 0924   AST 13 12/07/2013 0336   ALT 28 06/27/2014 0924   ALT 16 12/07/2013 0336   ALKPHOS 70 06/27/2014 0924   ALKPHOS 59 12/07/2013 0336   BILITOT 0.6 06/27/2014 0924   BILITOT 0.9 12/07/2013 0336   PROT 7.6 06/27/2014 0924   PROT 6.0 12/07/2013 0336   ALBUMIN 4.2 06/27/2014 0924   ALBUMIN 3.3* 12/07/2013 0336     Studies/Results: No results found.  Anti-infectives: Anti-infectives    Start     Dose/Rate Route Frequency Ordered Stop   07/01/14 0545  metroNIDAZOLE (FLAGYL) IVPB 500 mg     500 mg 100 mL/hr over 60 Minutes Intravenous  Once 07/01/14 0544 07/01/14 0741   07/01/14 0544  ciprofloxacin (CIPRO) IVPB 400 mg     400 mg 200 mL/hr over 60 Minutes Intravenous On call to O.R. 07/01/14 0544 07/01/14 0752      Assessment Principal Problem:   Sigmoid diverticulitis s/p lap assisted sigmoid colectomy 07/01/14-stable overnight; surgery course discussed with him    LOS: 1 day   Plan: Clear liquids.  OOB.   [name] J 07/02/2014

## 2014-07-03 MED ORDER — KETOROLAC TROMETHAMINE 30 MG/ML IJ SOLN
30.0000 mg | Freq: Four times a day (QID) | INTRAMUSCULAR | Status: AC
  Administered 2014-07-03 – 2014-07-04 (×6): 30 mg via INTRAVENOUS
  Filled 2014-07-03 (×6): qty 1

## 2014-07-03 NOTE — Progress Notes (Signed)
2 Days Post-Op  Subjective: Still having a lot of incisional pain.  Hurts to take a deep breath.  No flatus or BM.  No nausea.    Objective: Vital signs in last 24 hours: Temp:  [97.6 F (36.4 C)-100 F (37.8 C)] 99.7 F (37.6 C) (02/18 0545) Pulse Rate:  [67-89] 89 (02/18 0545) Resp:  [13-19] 19 (02/18 0545) BP: (111-126)/(52-82) 111/61 mmHg (02/18 0545) SpO2:  [94 %-100 %] 96 % (02/18 0545) Last BM Date: 06/30/14  Intake/Output from previous day: 02/17 0701 - 02/18 0700 In: 2605 [P.O.:480; I.V.:2125] Out: 2120 [Urine:1950; Drains:170] Intake/Output this shift:    PE: General- In NAD Lungs-decreased breath sounds in bases Abdomen-soft, hypoactive bowel sounds, dressings dry, thin serosanguinous drain output  Lab Results:   Recent Labs  07/02/14 0541  WBC 4.5  HGB 11.5*  HCT 34.5*  PLT 180   BMET  Recent Labs  07/02/14 0541  NA 137  K 3.7  CL 103  CO2 31  GLUCOSE 96  BUN 10  CREATININE 0.95  CALCIUM 8.3*   PT/INR No results for input(s): LABPROT, INR in the last 72 hours. Comprehensive Metabolic Panel:    Component Value Date/Time   NA 137 07/02/2014 0541   NA 137 06/27/2014 0924   K 3.7 07/02/2014 0541   K 3.8 06/27/2014 0924   CL 103 07/02/2014 0541   CL 102 06/27/2014 0924   CO2 31 07/02/2014 0541   CO2 26 06/27/2014 0924   BUN 10 07/02/2014 0541   BUN 13 06/27/2014 0924   CREATININE 0.95 07/02/2014 0541   CREATININE 0.84 06/27/2014 0924   GLUCOSE 96 07/02/2014 0541   GLUCOSE 88 06/27/2014 0924   CALCIUM 8.3* 07/02/2014 0541   CALCIUM 9.1 06/27/2014 0924   AST 22 06/27/2014 0924   AST 13 12/07/2013 0336   ALT 28 06/27/2014 0924   ALT 16 12/07/2013 0336   ALKPHOS 70 06/27/2014 0924   ALKPHOS 59 12/07/2013 0336   BILITOT 0.6 06/27/2014 0924   BILITOT 0.9 12/07/2013 0336   PROT 7.6 06/27/2014 0924   PROT 6.0 12/07/2013 0336   ALBUMIN 4.2 06/27/2014 0924   ALBUMIN 3.3* 12/07/2013 0336     Studies/Results: No results  found.  Anti-infectives: Anti-infectives    Start     Dose/Rate Route Frequency Ordered Stop   07/01/14 0545  metroNIDAZOLE (FLAGYL) IVPB 500 mg     500 mg 100 mL/hr over 60 Minutes Intravenous  Once 07/01/14 0544 07/01/14 0741   07/01/14 0544  ciprofloxacin (CIPRO) IVPB 400 mg     400 mg 200 mL/hr over 60 Minutes Intravenous On call to O.R. 07/01/14 0544 07/01/14 0752      Assessment Principal Problem:   Sigmoid diverticulitis s/p lap assisted sigmoid colectomy 07/01/14-bowel function has not returned; low grade fever and decreased SaO2 most likely due to atelectasis   LOS: 2 days   Plan: Full liquids.  Increase time OOB.  Restart Toradol.   Adolph PollackOSENBOWER,Khalie Wince J 07/03/2014

## 2014-07-04 MED ORDER — KETOROLAC TROMETHAMINE 30 MG/ML IJ SOLN
30.0000 mg | Freq: Four times a day (QID) | INTRAMUSCULAR | Status: AC
  Administered 2014-07-04 – 2014-07-05 (×5): 30 mg via INTRAVENOUS
  Filled 2014-07-04 (×6): qty 1

## 2014-07-04 NOTE — Progress Notes (Signed)
3 Days Post-Op  Subjective: Had nausea after eating ice cream.  Tolerating apple juice.  Not hungry.  Passing gas.  Had a BM.  Incisional pain relieved best by Toradol.  Objective: Vital signs in last 24 hours: Temp:  [97.5 F (36.4 C)-98.6 F (37 C)] 98.1 F (36.7 C) (02/19 1019) Pulse Rate:  [72-80] 72 (02/19 1019) Resp:  [13-18] 13 (02/19 1200) BP: (127-136)/(62-82) 127/62 mmHg (02/19 1019) SpO2:  [96 %-99 %] 97 % (02/19 1200) Last BM Date: 07/04/14  Intake/Output from previous day: 02/18 0701 - 02/19 0700 In: 3120 [P.O.:720; I.V.:2400] Out: 2110 [Urine:1850; Drains:260] Intake/Output this shift: Total I/O In: 400 [I.V.:400] Out: 75 [Drains:75]  PE: General- In NAD Lungs-decreased breath sounds in bases Abdomen-soft, more active bowel sounds today, incisions are clean and intact, thin serosanguinous drain output  Lab Results:   Recent Labs  07/02/14 0541  WBC 4.5  HGB 11.5*  HCT 34.5*  PLT 180   BMET  Recent Labs  07/02/14 0541  NA 137  K 3.7  CL 103  CO2 31  GLUCOSE 96  BUN 10  CREATININE 0.95  CALCIUM 8.3*   PT/INR No results for input(s): LABPROT, INR in the last 72 hours. Comprehensive Metabolic Panel:    Component Value Date/Time   NA 137 07/02/2014 0541   NA 137 06/27/2014 0924   K 3.7 07/02/2014 0541   K 3.8 06/27/2014 0924   CL 103 07/02/2014 0541   CL 102 06/27/2014 0924   CO2 31 07/02/2014 0541   CO2 26 06/27/2014 0924   BUN 10 07/02/2014 0541   BUN 13 06/27/2014 0924   CREATININE 0.95 07/02/2014 0541   CREATININE 0.84 06/27/2014 0924   GLUCOSE 96 07/02/2014 0541   GLUCOSE 88 06/27/2014 0924   CALCIUM 8.3* 07/02/2014 0541   CALCIUM 9.1 06/27/2014 0924   AST 22 06/27/2014 0924   AST 13 12/07/2013 0336   ALT 28 06/27/2014 0924   ALT 16 12/07/2013 0336   ALKPHOS 70 06/27/2014 0924   ALKPHOS 59 12/07/2013 0336   BILITOT 0.6 06/27/2014 0924   BILITOT 0.9 12/07/2013 0336   PROT 7.6 06/27/2014 0924   PROT 6.0 12/07/2013 0336   ALBUMIN 4.2 06/27/2014 0924   ALBUMIN 3.3* 12/07/2013 0336     Studies/Results: No results found.  Anti-infectives: Anti-infectives    Start     Dose/Rate Route Frequency Ordered Stop   07/01/14 0545  metroNIDAZOLE (FLAGYL) IVPB 500 mg     500 mg 100 mL/hr over 60 Minutes Intravenous  Once 07/01/14 0544 07/01/14 0741   07/01/14 0544  ciprofloxacin (CIPRO) IVPB 400 mg     400 mg 200 mL/hr over 60 Minutes Intravenous On call to O.R. 07/01/14 0544 07/01/14 0752      Assessment Principal Problem:   Sigmoid diverticulitis s/p lap assisted sigmoid colectomy 07/01/14-bowel function is starting to return; he is not interested in advancing his diet; Path->diverticulitis   LOS: 3 days   Plan: Continue full liquids for now and advance diet when nausea is better.   Grey Schlauch J 07/04/2014

## 2014-07-05 MED ORDER — PANTOPRAZOLE SODIUM 40 MG PO TBEC
40.0000 mg | DELAYED_RELEASE_TABLET | Freq: Every day | ORAL | Status: DC
  Administered 2014-07-05 – 2014-07-07 (×3): 40 mg via ORAL
  Filled 2014-07-05 (×3): qty 1

## 2014-07-05 MED ORDER — OXYCODONE HCL 5 MG PO TABS
5.0000 mg | ORAL_TABLET | ORAL | Status: DC | PRN
  Administered 2014-07-05 – 2014-07-06 (×8): 10 mg via ORAL
  Administered 2014-07-07: 5 mg via ORAL
  Administered 2014-07-07 (×2): 10 mg via ORAL
  Filled 2014-07-05 (×11): qty 2

## 2014-07-05 NOTE — Progress Notes (Signed)
Patient ID: [name], male   DOB: 03/10/1991, 24 y.o.   MRN: [id number]  [facility]  POD#: 4  Subjective: Patient comfortable.  No nausea or emesis. Passing flatus and loose stools.  On full liquid diet.  Family at bedside.  Objective: Vital signs in last 24 hours: Temp:  [98.1 F (36.7 C)-99.4 F (37.4 C)] 98.2 F (36.8 C) (02/20 0511) Pulse Rate:  [55-76] 55 (02/20 0511) Resp:  [13-20] 15 (02/20 0803) BP: (115-127)/(51-90) 126/51 mmHg (02/20 0511) SpO2:  [94 %-99 %] 94 % (02/20 0803) FiO2 (%):  [44 %-45 %] 45 % (02/20 0502) Last BM Date: 07/05/14  Intake/Output from previous day: 02/19 0701 - 02/20 0700 In: 2220 [P.O.:540; I.V.:1680] Out: 880 [Urine:625; Drains:255] Intake/Output this shift: Total I/O In: -  Out: 335 [Urine:250; Drains:85]  Physical Exam: HEENT - sclerae clear, mucous membranes moist Neck - soft Chest - clear bilaterally Cor - RRR Abdomen - soft, obese; minimal distension; BS present; wounds dry and intact; JP drain with thin serosanguinous Ext - no edema, non-tender Neuro - alert & oriented, no focal deficits  Lab Results:  No results for input(s): WBC, HGB, HCT, PLT in the last 72 hours. BMET No results for input(s): NA, K, CL, CO2, GLUCOSE, BUN, CREATININE, CALCIUM in the last 72 hours. PT/INR No results for input(s): LABPROT, INR in the last 72 hours. Comprehensive Metabolic Panel:    Component Value Date/Time   NA 137 07/02/2014 0541   NA 137 06/27/2014 0924   K 3.7 07/02/2014 0541   K 3.8 06/27/2014 0924   CL 103 07/02/2014 0541   CL 102 06/27/2014 0924   CO2 31 07/02/2014 0541   CO2 26 06/27/2014 0924   BUN 10 07/02/2014 0541   BUN 13 06/27/2014 0924   CREATININE 0.95 07/02/2014 0541   CREATININE 0.84 06/27/2014 0924   GLUCOSE 96 07/02/2014 0541   GLUCOSE 88 06/27/2014 0924   CALCIUM 8.3* 07/02/2014 0541   CALCIUM 9.1 06/27/2014 0924   AST 22 06/27/2014 0924   AST 13 12/07/2013 0336   ALT 28 06/27/2014 0924   ALT 16 12/07/2013 0336   ALKPHOS 70 06/27/2014 0924   ALKPHOS 59 12/07/2013 0336   BILITOT 0.6 06/27/2014 0924   BILITOT 0.9 12/07/2013 0336   PROT 7.6 06/27/2014 0924   PROT 6.0 12/07/2013 0336   ALBUMIN 4.2 06/27/2014 0924   ALBUMIN 3.3* 12/07/2013 0336    Studies/Results: No results found.  Anti-infectives: Anti-infectives    Start     Dose/Rate Route Frequency Ordered Stop   07/01/14 0545  metroNIDAZOLE (FLAGYL) IVPB 500 mg     500 mg 100 mL/hr over 60 Minutes Intravenous  Once 07/01/14 0544 07/01/14 0741   07/01/14 0544  ciprofloxacin (CIPRO) IVPB 400 mg     400 mg 200 mL/hr over 60 Minutes Intravenous On call to O.R. 07/01/14 0544 07/01/14 0752      Assessment & Plans: Status post lap assisted sigmoid colectomy for diverticular disease  Patient wishes to remain on full liquid diet  Will convert to oral pain Rx  OOB, ambulate in halls  Velora Hecklerodd M. Homer Miller, MD, [place]ACS [facility] Office: [phone]   Jammi Morrissette Judie PetitM 07/05/2014

## 2014-07-06 MED ORDER — HYDROMORPHONE HCL 1 MG/ML IJ SOLN
INTRAMUSCULAR | Status: AC
  Administered 2014-07-06: 0.5 mg via INTRAVENOUS
  Filled 2014-07-06: qty 1

## 2014-07-06 MED ORDER — HYDROMORPHONE HCL 1 MG/ML IJ SOLN
0.5000 mg | INTRAMUSCULAR | Status: DC | PRN
  Administered 2014-07-06: 0.5 mg via INTRAVENOUS

## 2014-07-06 NOTE — Progress Notes (Signed)
Patient ID: [name], male   DOB: 12/05/1990, 24 y.o.   MRN: [id number]  [facility]  POD#: 5  Subjective: Patient sleeping - awakens easily.  Needed IV dilaudid once yesterday.  Otherwise getting pain control from oral meds.  Ambulating.  Diarrhea.  Objective: Vital signs in last 24 hours: Temp:  [98.2 F (36.8 C)-98.9 F (37.2 C)] 98.3 F (36.8 C) (02/21 0548) Pulse Rate:  [68-98] 98 (02/21 0548) Resp:  [15-18] 18 (02/21 0548) BP: (108-144)/(57-80) 108/57 mmHg (02/21 0548) SpO2:  [94 %-99 %] 97 % (02/21 0548) Last BM Date: 07/05/14  Intake/Output from previous day: 02/20 0701 - 02/21 0700 In: 2400 [P.O.:1060; I.V.:1340] Out: 867 [Urine:652; Drains:215] Intake/Output this shift:    Physical Exam: HEENT - sclerae clear, mucous membranes moist Neck - soft Chest - clear bilaterally Cor - RRR Abdomen - soft without distension; BS present; wounds dry; JP with moderate thin serosanguinous Ext - no edema, non-tender Neuro - alert & oriented, no focal deficits  Lab Results:  No results for input(s): WBC, HGB, HCT, PLT in the last 72 hours. BMET No results for input(s): NA, K, CL, CO2, GLUCOSE, BUN, CREATININE, CALCIUM in the last 72 hours. PT/INR No results for input(s): LABPROT, INR in the last 72 hours. Comprehensive Metabolic Panel:    Component Value Date/Time   NA 137 07/02/2014 0541   NA 137 06/27/2014 0924   K 3.7 07/02/2014 0541   K 3.8 06/27/2014 0924   CL 103 07/02/2014 0541   CL 102 06/27/2014 0924   CO2 31 07/02/2014 0541   CO2 26 06/27/2014 0924   BUN 10 07/02/2014 0541   BUN 13 06/27/2014 0924   CREATININE 0.95 07/02/2014 0541   CREATININE 0.84 06/27/2014 0924   GLUCOSE 96 07/02/2014 0541   GLUCOSE 88 06/27/2014 0924   CALCIUM 8.3* 07/02/2014 0541   CALCIUM 9.1 06/27/2014 0924   AST 22 06/27/2014 0924   AST 13 12/07/2013 0336   ALT 28 06/27/2014 0924   ALT 16 12/07/2013 0336   ALKPHOS 70 06/27/2014 0924    ALKPHOS 59 12/07/2013 0336   BILITOT 0.6 06/27/2014 0924   BILITOT 0.9 12/07/2013 0336   PROT 7.6 06/27/2014 0924   PROT 6.0 12/07/2013 0336   ALBUMIN 4.2 06/27/2014 0924   ALBUMIN 3.3* 12/07/2013 0336    Studies/Results: No results found.  Anti-infectives: Anti-infectives    Start     Dose/Rate Route Frequency Ordered Stop   07/01/14 0545  metroNIDAZOLE (FLAGYL) IVPB 500 mg     500 mg 100 mL/hr over 60 Minutes Intravenous  Once 07/01/14 0544 07/01/14 0741   07/01/14 0544  ciprofloxacin (CIPRO) IVPB 400 mg     400 mg 200 mL/hr over 60 Minutes Intravenous On call to O.R. 07/01/14 0544 07/01/14 0752      Assessment & Plans: Status post lap assisted sigmoid colectomy  Regular diet today  Ambulate  Oral pain meds  Likely home tomorrow  Velora Hecklerodd M. Idora Brosious, MD, [facility] Office: [phone]   [name] 07/06/2014

## 2014-07-06 NOTE — Progress Notes (Signed)
Pt complaining of sudden pain of 10/10 that woke him up. Pt was moaning and crying.  Was not time for po pain medication. MD notified. Orders given. Pt given dilaudid iv. Pt stated he felt better within a few minutes Val EagleLemons, Nydia BoutonAmanda King

## 2014-07-07 MED ORDER — OXYCODONE HCL 5 MG PO TABS: 5.0000 mg | ORAL_TABLET | ORAL | Status: DC | PRN

## 2014-07-07 NOTE — Progress Notes (Signed)
6 Days Post-Op  Subjective: Working on Animatorcomputer. Moving bowels, frequency decreasing and starting to have some solid stool.  Tolerating soft diet.    Objective: Vital signs in last 24 hours: Temp:  [98.3 F (36.8 C)-99.1 F (37.3 C)] 98.3 F (36.8 C) (02/22 0602) Pulse Rate:  [76-104] 76 (02/22 0602) Resp:  [18] 18 (02/22 0602) BP: (129-132)/(68-81) 129/70 mmHg (02/22 0602) SpO2:  [98 %-99 %] 98 % (02/22 0602) Last BM Date: 07/06/14  Intake/Output from previous day: 02/21 0701 - 02/22 0700 In: 1912.5 [P.O.:1200; I.V.:712.5] Out: 690 [Urine:525; Drains:165] Intake/Output this shift:    PE: General- In NAD Abdomen-soft, incisions are clean and intact, serous drain output  Lab Results:  No results for input(s): WBC, HGB, HCT, PLT in the last 72 hours. BMET No results for input(s): NA, K, CL, CO2, GLUCOSE, BUN, CREATININE, CALCIUM in the last 72 hours. PT/INR No results for input(s): LABPROT, INR in the last 72 hours. Comprehensive Metabolic Panel:    Component Value Date/Time   NA 137 07/02/2014 0541   NA 137 06/27/2014 0924   K 3.7 07/02/2014 0541   K 3.8 06/27/2014 0924   CL 103 07/02/2014 0541   CL 102 06/27/2014 0924   CO2 31 07/02/2014 0541   CO2 26 06/27/2014 0924   BUN 10 07/02/2014 0541   BUN 13 06/27/2014 0924   CREATININE 0.95 07/02/2014 0541   CREATININE 0.84 06/27/2014 0924   GLUCOSE 96 07/02/2014 0541   GLUCOSE 88 06/27/2014 0924   CALCIUM 8.3* 07/02/2014 0541   CALCIUM 9.1 06/27/2014 0924   AST 22 06/27/2014 0924   AST 13 12/07/2013 0336   ALT 28 06/27/2014 0924   ALT 16 12/07/2013 0336   ALKPHOS 70 06/27/2014 0924   ALKPHOS 59 12/07/2013 0336   BILITOT 0.6 06/27/2014 0924   BILITOT 0.9 12/07/2013 0336   PROT 7.6 06/27/2014 0924   PROT 6.0 12/07/2013 0336   ALBUMIN 4.2 06/27/2014 0924   ALBUMIN 3.3* 12/07/2013 0336     Studies/Results: No results found.  Anti-infectives: Anti-infectives    Start     Dose/Rate Route Frequency Ordered  Stop   07/01/14 0545  metroNIDAZOLE (FLAGYL) IVPB 500 mg     500 mg 100 mL/hr over 60 Minutes Intravenous  Once 07/01/14 0544 07/01/14 0741   07/01/14 0544  ciprofloxacin (CIPRO) IVPB 400 mg     400 mg 200 mL/hr over 60 Minutes Intravenous On call to O.R. 07/01/14 0544 07/01/14 0752      Assessment Principal Problem:   Sigmoid diverticulitis s/p lap assisted sigmoid colectomy 07/01/14-has progressed well over the weekend.  Pathology discussed with him.    LOS: 6 days   Plan: Remove drain-done.  Remove trocar site staples-done.  Discharge with follow up in office later this week to remove rest of staples.  Discharge instructions discussed with him.   Kellin Fifer Shela CommonsJ 07/07/2014

## 2014-07-07 NOTE — Discharge Instructions (Addendum)
[facility]      [facility] [place] [phone]  OPEN ABDOMINAL SURGERY: POST OP INSTRUCTIONS  Always review your discharge instruction sheet given to you by the facility where your surgery was performed.  IF YOU HAVE DISABILITY OR FAMILY LEAVE FORMS, YOU MUST BRING THEM TO THE OFFICE FOR PROCESSING.  PLEASE DO NOT GIVE THEM TO YOUR DOCTOR.  1. A prescription for pain medication may be given to you upon discharge.  Take your pain medication as prescribed, if needed.  If narcotic pain medicine is not needed, then you may take acetaminophen (Tylenol) or ibuprofen (Advil) as needed. 2. Take your usually prescribed medications unless otherwise directed. 3. If you need a refill on your pain medication, please contact your pharmacy. They will contact our office to request authorization.  Prescriptions will not be filled after 5pm or on week-ends. 4. You should follow a solid diet consisting of fruits and vegetables and a protein source other than red meat as we discussed.   5. It is common to experience some constipation if taking pain medication after surgery.  Increasing fluid intake and taking a stool softener will usually help or prevent this problem from occurring.  A mild laxative (Milk of Magnesia or Miralax) should be taken according to package directions if there are no bowel movements after 48 hours. 6.  You may have steri-strips (small skin tapes) in place directly over the incision.  These strips should be left on the skin.  If your surgeon used skin glue on the incision, you may shower in 24 hours.  The glue will flake off over the next 2-3 weeks.  Any sutures or staples will be removed at the office during your follow-up visit. You may find that a light gauze bandage over your incision may keep your staples from being rubbed or pulled. You may shower and replace the bandage daily. 7. ACTIVITIES:  You may resume regular (light) daily activities beginning the next day--such as daily  self-care, walking, climbing stairs--gradually increasing activities as tolerated.  You may have sexual intercourse when it is comfortable.  Refrain from any heavy lifting or straining-nothing over 10 pounds for at least 6 weeks.  a. You may drive when you no longer are taking prescription pain medication, you can comfortably wear a seatbelt, and you can safely maneuver your car and apply brakes b. Return to Work: _When released by MD.  Bonita QuinYou will need to be pain-free.__________________________________ 8. You should see your doctor in the office for a follow-up appointment approximately two weeks after your surgery.  Make sure that you call for this appointment within a day or two after you arrive home to insure a convenient appointment time. OTHER INSTRUCTIONS:  _My office will call you and arrange for you to come in later this week to have the remaining staples removed.____________________________________________________________ _____________________________________________________________  WHEN TO CALL YOUR DOCTOR: 1. Fever over 101.0 2. Inability to urinate 3. Nausea and/or vomiting 4. Extreme swelling or bruising 5. Continued bleeding from incision. 6. Increased pain, redness, or drainage from the incision. 7. Difficulty swallowing or breathing 8. Muscle cramping or spasms. 9. Numbness or tingling in hands or feet or around lips.  The clinic staff is available to answer your questions during regular business hours.  Please dont hesitate to call and ask to speak to one of the nurses if you have concerns.  For further questions, please visit [email or web address]

## 2014-07-07 NOTE — Discharge Summary (Signed)
Physician Discharge Summary  Patient ID: [name] MRN: [id number] DOB/AGE: 25/01/1991 23 y.o.  Admit date: 07/01/2014 Discharge date: 07/07/2014  Admission Diagnoses:  Recurrent sigmoid diverticulitis  Discharge Diagnoses:  Principal Problem:   Sigmoid diverticulitis s/p lap assisted sigmoid colectomy 07/01/14   Discharged Condition: good  Hospital Course: He underwent the above procedure and had a postop ileus which resolved.  He wounds were healing well.  Pathology was c/w diverticulitis.  On the day of discharge he was tolerating a solid diet, bowels were moving, he was ambulatory and discharge instructions were given to him.   Discharge Exam: Blood pressure 129/70, pulse 76, temperature 98.3 F (36.8 C), temperature source Oral, resp. rate 18, height 6\' 1"  (1.854 m), weight 261 lb 6 oz (118.559 kg), SpO2 98 %.   Disposition: 01-Home or Self Care     Medication List    STOP taking these medications        GOODY HEADACHE PO     oxyCODONE-acetaminophen 5-325 MG per tablet  Commonly known as:  PERCOCET/ROXICET      TAKE these medications        amphetamine-dextroamphetamine 25 MG 24 hr capsule  Commonly known as:  ADDERALL XR  Take 25 mg by mouth every morning.     oxyCODONE 5 MG immediate release tablet  Commonly known as:  Oxy IR/ROXICODONE  Take 1-2 tablets (5-10 mg total) by mouth every 4 (four) hours as needed for moderate pain.         Signed: Adolph PollackOSENBOWER,Elester Apodaca J 07/07/2014, 8:12 AM

## 2014-08-23 ENCOUNTER — Encounter (HOSPITAL_COMMUNITY): Payer: Self-pay | Admitting: Emergency Medicine

## 2014-08-23 ENCOUNTER — Inpatient Hospital Stay (HOSPITAL_COMMUNITY)
Admission: AD | Admit: 2014-08-23 | Discharge: 2014-08-28 | DRG: 881 | Disposition: A | Discharge status: Home / Self Care | Payer: 59 | Attending: Psychiatry | Admitting: Psychiatry

## 2014-08-23 ENCOUNTER — Encounter (HOSPITAL_COMMUNITY): Payer: Self-pay | Admitting: *Deleted

## 2014-08-23 ENCOUNTER — Emergency Department (HOSPITAL_COMMUNITY)
Admission: EM | Admit: 2014-08-23 | Discharge: 2014-08-23 | Disposition: A | Discharge status: Home / Self Care | Payer: 59 | Attending: Emergency Medicine | Admitting: Emergency Medicine

## 2014-08-23 DIAGNOSIS — F1110 Opioid abuse, uncomplicated: Secondary | ICD-10-CM | POA: Insufficient documentation

## 2014-08-23 DIAGNOSIS — F329 Major depressive disorder, single episode, unspecified: Principal | ICD-10-CM | POA: Diagnosis present

## 2014-08-23 DIAGNOSIS — F1410 Cocaine abuse, uncomplicated: Secondary | ICD-10-CM | POA: Diagnosis not present

## 2014-08-23 DIAGNOSIS — F1994 Other psychoactive substance use, unspecified with psychoactive substance-induced mood disorder: Secondary | ICD-10-CM

## 2014-08-23 DIAGNOSIS — F1290 Cannabis use, unspecified, uncomplicated: Secondary | ICD-10-CM | POA: Diagnosis not present

## 2014-08-23 DIAGNOSIS — F1111 Opioid abuse, in remission: Secondary | ICD-10-CM | POA: Diagnosis present

## 2014-08-23 DIAGNOSIS — G4700 Insomnia, unspecified: Secondary | ICD-10-CM | POA: Diagnosis present

## 2014-08-23 DIAGNOSIS — Z008 Encounter for other general examination: Secondary | ICD-10-CM | POA: Diagnosis present

## 2014-08-23 DIAGNOSIS — F3289 Other specified depressive episodes: Secondary | ICD-10-CM | POA: Insufficient documentation

## 2014-08-23 DIAGNOSIS — F419 Anxiety disorder, unspecified: Secondary | ICD-10-CM | POA: Diagnosis present

## 2014-08-23 DIAGNOSIS — F1420 Cocaine dependence, uncomplicated: Secondary | ICD-10-CM | POA: Diagnosis present

## 2014-08-23 DIAGNOSIS — F1123 Opioid dependence with withdrawal: Secondary | ICD-10-CM | POA: Diagnosis present

## 2014-08-23 DIAGNOSIS — R45851 Suicidal ideations: Secondary | ICD-10-CM

## 2014-08-23 HISTORY — DX: Depression, unspecified: F32.A

## 2014-08-23 HISTORY — DX: Anxiety disorder, unspecified: F41.9

## 2014-08-23 HISTORY — DX: Major depressive disorder, single episode, unspecified: F32.9

## 2014-08-23 LAB — RAPID URINE DRUG SCREEN, HOSP PERFORMED
AMPHETAMINES: NOT DETECTED
BARBITURATES: NOT DETECTED
Benzodiazepines: NOT DETECTED
Cocaine: POSITIVE — AB
Opiates: POSITIVE — AB
Tetrahydrocannabinol: POSITIVE — AB

## 2014-08-23 LAB — COMPREHENSIVE METABOLIC PANEL
ALK PHOS: 71 U/L (ref 39–117)
ALT: 22 U/L (ref 0–53)
ANION GAP: 6 (ref 5–15)
AST: 16 U/L (ref 0–37)
Albumin: 4.2 g/dL (ref 3.5–5.2)
BILIRUBIN TOTAL: 0.4 mg/dL (ref 0.3–1.2)
BUN: 10 mg/dL (ref 6–23)
CHLORIDE: 107 mmol/L (ref 96–112)
CO2: 27 mmol/L (ref 19–32)
CREATININE: 0.86 mg/dL (ref 0.50–1.35)
Calcium: 8.8 mg/dL (ref 8.4–10.5)
GLUCOSE: 113 mg/dL — AB (ref 70–99)
Potassium: 3.9 mmol/L (ref 3.5–5.1)
SODIUM: 140 mmol/L (ref 135–145)
Total Protein: 7.4 g/dL (ref 6.0–8.3)

## 2014-08-23 LAB — CBC
HCT: 40.1 % (ref 39.0–52.0)
Hemoglobin: 13.7 g/dL (ref 13.0–17.0)
MCH: 27.9 pg (ref 26.0–34.0)
MCHC: 34.2 g/dL (ref 30.0–36.0)
MCV: 81.7 fL (ref 78.0–100.0)
PLATELETS: 265 10*3/uL (ref 150–400)
RBC: 4.91 MIL/uL (ref 4.22–5.81)
RDW: 13.1 % (ref 11.5–15.5)
WBC: 5.4 10*3/uL (ref 4.0–10.5)

## 2014-08-23 LAB — TSH: TSH: 0.28 u[IU]/mL — ABNORMAL LOW (ref 0.350–4.500)

## 2014-08-23 LAB — ACETAMINOPHEN LEVEL: Acetaminophen (Tylenol), Serum: <10 ug/mL — ABNORMAL LOW (ref 10–30)

## 2014-08-23 LAB — ETHANOL: Alcohol, Ethyl (B): <5 mg/dL (ref 0–9)

## 2014-08-23 LAB — SALICYLATE LEVEL: Salicylate Lvl: <4 mg/dL (ref 2.8–20.0)

## 2014-08-23 MED ORDER — ACETAMINOPHEN 325 MG PO TABS
650.0000 mg | ORAL_TABLET | Freq: Four times a day (QID) | ORAL | Status: DC | PRN
  Administered 2014-08-24 – 2014-08-26 (×3): 650 mg via ORAL
  Filled 2014-08-23 (×3): qty 2

## 2014-08-23 MED ORDER — HYDROXYZINE HCL 25 MG PO TABS
25.0000 mg | ORAL_TABLET | Freq: Four times a day (QID) | ORAL | Status: DC | PRN
Start: 2014-08-23 — End: 2014-08-28
  Administered 2014-08-23 – 2014-08-27 (×7): 25 mg via ORAL
  Filled 2014-08-23 (×3): qty 1
  Filled 2014-08-23: qty 20
  Filled 2014-08-23 (×5): qty 1

## 2014-08-23 MED ORDER — NICOTINE 21 MG/24HR TD PT24
MEDICATED_PATCH | TRANSDERMAL | Status: AC
  Administered 2014-08-23: 20:00:00
  Filled 2014-08-23: qty 1

## 2014-08-23 MED ORDER — CLONIDINE HCL 0.1 MG PO TABS
0.1000 mg | ORAL_TABLET | ORAL | Status: AC
  Administered 2014-08-25 – 2014-08-27 (×4): 0.1 mg via ORAL
  Filled 2014-08-23 (×4): qty 1

## 2014-08-23 MED ORDER — DICYCLOMINE HCL 20 MG PO TABS: 20.0000 mg | ORAL_TABLET | Freq: Four times a day (QID) | ORAL | Status: DC | PRN

## 2014-08-23 MED ORDER — MAGNESIUM HYDROXIDE 400 MG/5ML PO SUSP: 30.0000 mL | Freq: Every day | ORAL | Status: DC | PRN

## 2014-08-23 MED ORDER — ALUM & MAG HYDROXIDE-SIMETH 200-200-20 MG/5ML PO SUSP: 30.0000 mL | ORAL | Status: DC | PRN

## 2014-08-23 MED ORDER — CLONIDINE HCL 0.1 MG PO TABS
0.1000 mg | ORAL_TABLET | Freq: Four times a day (QID) | ORAL | Status: AC
Start: 2014-08-23 — End: 2014-08-25
  Administered 2014-08-23 – 2014-08-25 (×8): 0.1 mg via ORAL
  Filled 2014-08-23 (×11): qty 1

## 2014-08-23 MED ORDER — NAPROXEN 500 MG PO TABS
500.0000 mg | ORAL_TABLET | Freq: Two times a day (BID) | ORAL | Status: DC | PRN
  Administered 2014-08-23 – 2014-08-28 (×6): 500 mg via ORAL
  Filled 2014-08-23 (×2): qty 1
  Filled 2014-08-23: qty 10
  Filled 2014-08-23 (×4): qty 1

## 2014-08-23 MED ORDER — CLONIDINE HCL 0.1 MG PO TABS
0.1000 mg | ORAL_TABLET | Freq: Every day | ORAL | Status: DC
  Administered 2014-08-28: 0.1 mg via ORAL
  Filled 2014-08-23: qty 14
  Filled 2014-08-23 (×2): qty 1

## 2014-08-23 MED ORDER — ONDANSETRON 4 MG PO TBDP: 4.0000 mg | ORAL_TABLET | Freq: Four times a day (QID) | ORAL | Status: DC | PRN

## 2014-08-23 MED ORDER — METHOCARBAMOL 500 MG PO TABS
500.0000 mg | ORAL_TABLET | Freq: Three times a day (TID) | ORAL | Status: DC | PRN
  Administered 2014-08-23 – 2014-08-25 (×3): 500 mg via ORAL
  Filled 2014-08-23 (×3): qty 1

## 2014-08-23 MED ORDER — NICOTINE 21 MG/24HR TD PT24
21.0000 mg | MEDICATED_PATCH | Freq: Every day | TRANSDERMAL | Status: DC
  Administered 2014-08-24 – 2014-08-28 (×5): 21 mg via TRANSDERMAL
  Filled 2014-08-23 (×7): qty 1

## 2014-08-23 MED ORDER — LOPERAMIDE HCL 2 MG PO CAPS: 2.0000 mg | ORAL_CAPSULE | ORAL | Status: DC | PRN

## 2014-08-23 NOTE — BH Assessment (Addendum)
Assessment Note  [name] is an [age] y.o. male who [facility] with the presenting problems of suicidal ideations in addition to detox from substances. Patient reported that for the past two years he has been using heroin on a daily basis in addition to smoking crack cocaine, marijuana, and using meth. Patient reports increased depressive symptoms that include insomnia, social isolation, and intensified feelings of guilt and worthlessness. Patient was observed to exhibit a depressed mood with congruent affect throughout the assessment evidenced by limited eye contact. Patient reports that he has tried to stop using substances in the past but then has intensified suicidal ideations due to withdrawal symptoms that he experiences. Patient reports withdrawal symptoms that include diarrhea, sweats, irritability, and muscle fatigue. Patient states that he can no longer continue to live life like this and that if he cannot detox from these substances that he will kill himself. Patient was observed to have marks on his inner right arm related to intravenous use. Patient reports that his parents are supportive in his desire to obtain sobriety, yet are not aware of what substances he has been using. Patient reports that he is planning to attend a substance abuse treatment plan (after stabilization) named [facility] near the [place] [employer], with coordination assistance provided by his parents per patient. Clinician consulted with Renata Capriceonrad, NP who states that patient will need medical clearance prior to inpatient admission.   Axis I: Major Depression, Recurrent severe and Cocaine Use Disorder, Cannabis Use Disorder, Opiate Use Disorder-Severe  Axis II: Deferred Axis III:  Past Medical History  Diagnosis Date  . Diverticulitis   . Attention deficit disorder   . Hiccups     pt states hiccups during sleep    Axis IV: other psychosocial or environmental problems, problems  related to social environment and problems with primary support group Axis V: 41-50 serious symptoms  Past Medical History:  Past Medical History  Diagnosis Date  . Diverticulitis   . Attention deficit disorder   . Hiccups     pt states hiccups during sleep     Past Surgical History  Procedure Laterality Date  . Colonscopy       removed polyps   . Laparoscopic partial colectomy N/A 07/01/2014    Procedure: LAPAROSCOPIC ASSISTED SIGMOID COLECTOMY WITH MOBILIZATION OF SPLENIC FLEXURE;  Surgeon: Avel Peaceodd Rosenbower, MD;  Location: [facility];  Service: General;  Laterality: N/A;    Family History: No family history on file.  Social History:  reports that he has never smoked. His smokeless tobacco use includes Chew. He reports that he drinks alcohol. He reports that he uses illicit drugs ("Crack" cocaine, Heroin, Methamphetamines, and Marijuana).  Additional Social History:  Alcohol / Drug Use History of alcohol / drug use?: Yes Substance #1 Name of Substance 1: Heroin  1 - Age of First Use: 20 1 - Amount (size/oz): 2 grams 1 - Frequency: daily 1 - Duration: years 1 - Last Use / Amount: 08/23/14-2 grams through IV Substance #2 Name of Substance 2: Crack Cocaine 2 - Age of First Use: 22 2 - Amount (size/oz): varies 2 - Frequency: 2x a week 2 - Duration: years 2 - Last Use / Amount: 08/23/14- 1.5grams Substance #3 Name of Substance 3: Meth 3 - Age of First Use: 22 3 - Amount (size/oz): varies 3 - Frequency: 2x a month 3 - Duration: years 3 - Last Use / Amount: Last Sunday-3 shots  CIWA:   COWS:  Allergies:  Allergies  Allergen Reactions  . Cephalexin Hives    Home Medications:  (Not in a hospital admission)  OB/GYN Status:  No LMP for male patient.  General Assessment Data Location of Assessment: [facility] Is this a Tele or Face-to-Face Assessment?: Face-to-Face Is this an Initial Assessment or a Re-assessment for this encounter?: Initial  Assessment Living Arrangements: Parent Can pt return to current living arrangement?: Yes Admission Status: Voluntary Is patient capable of signing voluntary admission?: Yes Transfer from: Acute Hospital Referral Source: Self/Family/Friend     [facility] Crisis Care Plan Living Arrangements: Parent Name of Psychiatrist: None Name of Therapist: None     Risk to self with the past 6 months Suicidal Ideation: Yes-Currently Present Suicidal Intent: No-Not Currently/Within Last 6 Months Is patient at risk for suicide?: Yes Suicidal Plan?: No-Not Currently/Within Last 6 Months Access to Means: Yes Specify Access to Suicidal Means: access to streets What has been your use of drugs/alcohol within the last 12 months?: ETOH, Heroin, Crack, and Meth Previous Attempts/Gestures: No How many times?: 0 Other Self Harm Risks: None Triggers for Past Attempts: None known Intentional Self Injurious Behavior: None Family Suicide History: No Recent stressful life event(s): Conflict (Comment) Persecutory voices/beliefs?: No Depression: Yes Depression Symptoms: Insomnia, Guilt, Loss of interest in usual pleasures, Feeling worthless/self pity Substance abuse history and/or treatment for substance abuse?: No  Risk to Others within the past 6 months Homicidal Ideation: No Thoughts of Harm to Others: No Current Homicidal Intent: No Current Homicidal Plan: No Access to Homicidal Means: No Identified Victim: None History of harm to others?: No Assessment of Violence: None Noted Violent Behavior Description: Pt is calm yet anxious  Does patient have access to weapons?: Yes (Comment) (Patient stated he could get guns off of the street) Criminal Charges Pending?: No Does patient have a court date: Yes (To pay traffic fine)  Psychosis Hallucinations: None noted Delusions: None noted  Mental Status Report Appearance/Hygiene: Improved Eye Contact: Poor Motor Activity: Freedom of movement Speech:  Logical/coherent Level of Consciousness: Alert Mood: Depressed, Anxious Affect: Anxious, Depressed Anxiety Level: Minimal Thought Processes: Coherent, Relevant Judgement: Partial Orientation: Person, Place, Time, Situation Obsessive Compulsive Thoughts/Behaviors: None  Cognitive Functioning Concentration: Decreased Memory: Recent Intact, Remote Intact IQ: Average Insight: Fair Impulse Control: Poor Appetite: Poor Sleep: No Change Total Hours of Sleep: 4 Vegetative Symptoms: None  ADLScreening [facility] Assessment Services) Patient's cognitive ability adequate to safely complete daily activities?: Yes Patient able to express need for assistance with ADLs?: Yes Independently performs ADLs?: Yes (appropriate for developmental age)  Prior Inpatient Therapy Prior Inpatient Therapy: No  Prior Outpatient Therapy Prior Outpatient Therapy: No  ADL Screening (condition at time of admission) Patient's cognitive ability adequate to safely complete daily activities?: Yes Is the patient deaf or have difficulty hearing?: No Does the patient have difficulty seeing, even when wearing glasses/contacts?: No Does the patient have difficulty concentrating, remembering, or making decisions?: No Patient able to express need for assistance with ADLs?: Yes Does the patient have difficulty dressing or bathing?: No Independently performs ADLs?: Yes (appropriate for developmental age) Does the patient have difficulty walking or climbing stairs?: No Weakness of Legs: None Weakness of Arms/Hands: None  Home Assistive Devices/Equipment Home Assistive Devices/Equipment: None  Therapy Consults (therapy consults require a physician order) PT Evaluation Needed: No OT Evalulation Needed: No SLP Evaluation Needed: No Abuse/Neglect Assessment (Assessment to be complete while patient is alone) Physical Abuse: Denies Verbal Abuse: Denies Sexual Abuse: Denies Exploitation of patient/patient's  resources:  Denies Self-Neglect: Denies Values / Beliefs Cultural Requests During Hospitalization: None Spiritual Requests During Hospitalization: None Consults Spiritual Care Consult Needed: No Social Work Consult Needed: No      Additional Information 1:1 In Past 12 Months?: No CIRT Risk: No Elopement Risk: No Does patient have medical clearance?: No     Disposition: Clinician consulted with Conrad,NP who states that patient requires medical clearance prior to admission at [facility]. Bed will be available upon medical clearance verification per Inetta Fermo [facility] Disposition Initial Assessment Completed for this Encounter: Yes  On Site Evaluation by:   Reviewed with Physician:    Paulino Door, Jonty Morrical C 08/23/2014 1:50 PM

## 2014-08-23 NOTE — Progress Notes (Signed)
Disposition: Clinician consulted with Conrad,NP who states that patient [facility] medical [facility]. Bed will be available upon medical clearance verification per In[facility] Fermoina, [facility]  [facility],RN notified of patient's need for medical clearance.   [facility] AC request that patient arrive at 3pm. [facility] notified by clinician.    Janann ColonelGregory Pickett Jr. MSW, LCSW Therapeutic Triage Services-Triage Specialist   Phone: [phone] Fax: [phone]

## 2014-08-23 NOTE — Discharge Instructions (Signed)
Return to the emergency room with worsening of symptoms, new symptoms or with symptoms that are concerning. Alcohol Use Disorder Alcohol use disorder is a mental disorder. It is not a one-time incident of heavy drinking. Alcohol use disorder is the excessive and uncontrollable use of alcohol over time that leads to problems with functioning in one or more areas of daily living. People with this disorder risk harming themselves and others when they drink to excess. Alcohol use disorder also can cause other mental disorders, such as mood and anxiety disorders, and serious physical problems. People with alcohol use disorder often misuse other drugs.  Alcohol use disorder is common and widespread. Some people with this disorder drink alcohol to cope with or escape from negative life events. Others drink to relieve chronic pain or symptoms of mental illness. People with a family history of alcohol use disorder are at higher risk of losing control and using alcohol to excess.  SYMPTOMS  Signs and symptoms of alcohol use disorder may include the following:   Consumption ofalcohol inlarger amounts or over a longer period of time than intended.  Multiple unsuccessful attempts to cutdown or control alcohol use.   A great deal of time spent obtaining alcohol, using alcohol, or recovering from the effects of alcohol (hangover).  A strong desire or urge to use alcohol (cravings).   Continued use of alcohol despite problems at work, school, or home because of alcohol use.   Continued use of alcohol despite problems in relationships because of alcohol use.  Continued use of alcohol in situations when it is physically hazardous, such as driving a car.  Continued use of alcohol despite awareness of a physical or psychological problem that is likely related to alcohol use. Physical problems related to alcohol use can involve the brain, heart, liver, stomach, and intestines. Psychological problems related to  alcohol use include intoxication, depression, anxiety, psychosis, delirium, and dementia.   The need for increased amounts of alcohol to achieve the same desired effect, or a decreased effect from the consumption of the same amount of alcohol (tolerance).  Withdrawal symptoms upon reducing or stopping alcohol use, or alcohol use to reduce or avoid withdrawal symptoms. Withdrawal symptoms include:  Racing heart.  Hand tremor.  Difficulty sleeping.  Nausea.  Vomiting.  Hallucinations.  Restlessness.  Seizures. DIAGNOSIS Alcohol use disorder is diagnosed through an assessment by your health care provider. Your health care provider may start by asking three or four questions to screen for excessive or problematic alcohol use. To confirm a diagnosis of alcohol use disorder, at least two symptoms must be present within a 2360-month period. The severity of alcohol use disorder depends on the number of symptoms:  Mild--two or three.  Moderate--four or five.  Severe--six or more. Your health care provider may perform a physical exam or use results from lab tests to see if you have physical problems resulting from alcohol use. Your health care provider may refer you to a mental health professional for evaluation. TREATMENT  Some people with alcohol use disorder are able to reduce their alcohol use to low-risk levels. Some people with alcohol use disorder need to quit drinking alcohol. When necessary, mental health professionals with specialized training in substance use treatment can help. Your health care provider can help you decide how severe your alcohol use disorder is and what type of treatment you need. The following forms of treatment are available:   Detoxification. Detoxification involves the use of prescription medicines to prevent alcohol withdrawal  symptoms in the first week after quitting. This is important for people with a history of symptoms of withdrawal and for heavy drinkers  who are likely to have withdrawal symptoms. Alcohol withdrawal can be dangerous and, in severe cases, cause death. Detoxification is usually provided in a hospital or in-patient substance use treatment facility.  Counseling or talk therapy. Talk therapy is provided by substance use treatment counselors. It addresses the reasons people use alcohol and ways to keep them from drinking again. The goals of talk therapy are to help people with alcohol use disorder find healthy activities and ways to cope with life stress, to identify and avoid triggers for alcohol use, and to handle cravings, which can cause relapse.  Medicines.Different medicines can help treat alcohol use disorder through the following actions:  Decrease alcohol cravings.  Decrease the positive reward response felt from alcohol use.  Produce an uncomfortable physical reaction when alcohol is used (aversion therapy).  Support groups. Support groups are run by people who have quit drinking. They provide emotional support, advice, and guidance. These forms of treatment are often combined. Some people with alcohol use disorder benefit from intensive combination treatment provided by specialized substance use treatment centers. Both inpatient and outpatient treatment programs are available. Document Released: 06/09/2004 Document Revised: 09/16/2013 Document Reviewed: 08/09/2012 [facility] Patient Information 2015 [place], [place]. This information is not intended to replace advice given to you by your health care provider. Make sure you discuss any questions you have with your health care provider.

## 2014-08-23 NOTE — ED Notes (Signed)
[facility], she  will call on-call psychiatrist to get orders for the pt. Meriam SpragueBeverly, RN made aware of same. Verbalizes agreement with plan. [facility] called for transport.

## 2014-08-23 NOTE — ED Provider Notes (Signed)
CSN: [id number]     Arrival date & time 08/23/14  1459 History   This chart was scribed for non-physician practitioner Oswaldo Conroy PA-C working with Lorre Nick, MD by Conchita Paris, ED Scribe. This patient was seen in [facility] and the patient's care was started at 4:12 PM.    Chief Complaint  Patient presents with  . Medical Clearance   The history is provided by the patient. No language interpreter was used.    HPI Comments: [name] is a 24 y.o. male who presents to the Emergency Department for medical clearance. Pt states he wants to detox from heroin, last use 8 hours. He also has a history of methamphetamine and crack abuse. Occasionally drinks alcohol. Not to excess. No withdrawal symptoms currently. During withdrawal his symptoms include: chills diaphoresis, muscle cramping, and shaking. He also has SI but he does not have plan. He denies HI, CP, SOB, HA, blurry vision, hallucinations.    Past Medical History  Diagnosis Date  . Diverticulitis   . Attention deficit disorder   . Hiccups     pt states hiccups during sleep    Past Surgical History  Procedure Laterality Date  . Colonscopy       removed polyps   . Laparoscopic partial colectomy N/A 07/01/2014    Procedure: LAPAROSCOPIC ASSISTED SIGMOID COLECTOMY WITH MOBILIZATION OF SPLENIC FLEXURE;  Surgeon: Avel Peace, MD;  Location: [facility];  Service: General;  Laterality: N/A;   History reviewed. No pertinent family history. History  Substance Use Topics  . Smoking status: Never Smoker   . Smokeless tobacco: Current User    Types: Chew  . Alcohol Use: Yes     Comment: occasionally drinks beer - 1 every other wk     Review of Systems  Eyes: Negative for photophobia and visual disturbance.  Respiratory: Negative for chest tightness and shortness of breath.   Cardiovascular: Negative for chest pain and palpitations.  Skin: Negative for color change and rash.  Neurological: Negative for weakness, numbness  and headaches.  Psychiatric/Behavioral: Positive for suicidal ideas.     Allergies  Cephalexin  Home Medications   Prior to Admission medications   Medication Sig Start Date End Date Taking? Authorizing Provider  amphetamine-dextroamphetamine (ADDERALL XR) 25 MG 24 hr capsule Take 25 mg by mouth every morning.    Historical Provider, MD  oxyCODONE (OXY IR/ROXICODONE) 5 MG immediate release tablet Take 1-2 tablets (5-10 mg total) by mouth every 4 (four) hours as needed for moderate pain. Patient not taking: Reported on 08/23/2014 07/07/14   Avel Peace, MD   BP 116/62 mmHg  Pulse 79  Temp(Src) 98.5 F (36.9 C) (Oral)  Resp 16  SpO2 99% Physical Exam  Constitutional: He appears well-developed and well-nourished. No distress.  HENT:  Head: Normocephalic and atraumatic.  Eyes: Conjunctivae and EOM are normal. Pupils are equal, round, and reactive to light. Right eye exhibits no discharge. Left eye exhibits no discharge.  Cardiovascular: Normal rate and regular rhythm.   Pulmonary/Chest: Effort normal and breath sounds normal. No respiratory distress. He has no wheezes.  Abdominal: Soft. Bowel sounds are normal. He exhibits no distension. There is no tenderness.  Neurological: He is alert. He exhibits normal muscle tone. Coordination normal.  Speech fluent and goal oriented. Patient moves all four extremities without any focal neurological deficits and has no facial droop. Normal gait.  Skin: Skin is warm and dry. He is not diaphoretic.  Bilateral injection sites at antecubital fossa without evidence of  infection.  Psychiatric: His speech is normal. His affect is not angry and not labile. He is not agitated and not slowed. He does not exhibit a depressed mood.  Nursing note and vitals reviewed.  ED Course  Procedures  DIAGNOSTIC STUDIES: Oxygen Saturation is 99% on room air, normal by my interpretation.    COORDINATION OF CARE: 4:21 PM Discussed treatment plan with pt at bedside  and pt agreed to plan.  Labs Review Labs Reviewed  ACETAMINOPHEN LEVEL - Abnormal; Notable for the following:    Acetaminophen (Tylenol), Serum <10.0 (*)    All other components within normal limits  COMPREHENSIVE METABOLIC PANEL - Abnormal; Notable for the following:    Glucose, Bld 113 (*)    All other components within normal limits  URINE RAPID DRUG SCREEN (HOSP PERFORMED) - Abnormal; Notable for the following:    Opiates POSITIVE (*)    Cocaine POSITIVE (*)    Tetrahydrocannabinol POSITIVE (*)    All other components within normal limits  CBC  ETHANOL  SALICYLATE LEVEL   Imaging Review No results found.   EKG Interpretation None      MDM   Final diagnoses:  Heroin abuse  Marijuana use  Suicidal thoughts   Patient with heroin abuse was evaluated by behavioral health and has a bed. He is presenting here for medical clearance. VSS. Patient has had no medical complaints at this time. He states is not actively withdrawing. He has normal neurological exam. Lab work reviewed without significant abnormalities other than UDS which is positive for opioids, cocaine, marijuana. Patient has been medically cleared and is stable for transfer to behavioral health.  Discussed return precautions with patient. Discussed all results and patient verbalizes understanding and agrees with plan.  I personally performed the services described in this documentation, which was scribed in my presence. The recorded information has been reviewed and is accurate.    Oswaldo ConroyVictoria Jamis Kryder, PA-C 08/23/14 1701  Lorre NickAnthony Allen, MD 08/25/14 Jacinta [id number]

## 2014-08-23 NOTE — ED Notes (Signed)
[facility]. Here to be medically cleared

## 2014-08-23 NOTE — Progress Notes (Signed)
First admission voluntary for 24 yr old patient.  Patient denied SI and HI, contracts for safety.  Denied A/V hallucinations.  Patient stated he has been drinking approximately 2  12 oz can beers daily, first drank at age 24 years.  THC daily 1-2 grams daily, started at 24 yrs old.  Heroin daily, 2 grams daily, started 2 yrs ago.  Cocaine long time ago.  Crack 1/2 gram daily, started one yr ago, last used last night.  Nicotine, uses dip approximately 2 cans daily.  Works at [employer], [place], Merchandiser, retailsupervisor.  Plans to go to rehab after discharge.  High school diploma.  [place] and will return to live with parents after rehab.  Parents knew he was using but could not stop him.  Denied any abuse.  No children, never married.  Denied depression and anxiety at admission.  Hopeless #3.  Wears glasses, no dental or hearing problems.  No health problems at this time.  Has not taken adderall for ADD in a month or more.  Half of colon removed, diverticulitis, lower abd scar healed.  [facility].   No criminal charges. Brother Mylinda LatinaBobby Batte phone [phone] can be reached if necessary. Fall risk information given and discussed with patient who is low fall risk. Locker [address] has tennis shoes, belt, bookbag, wallet with two $20, cap, bag, shorts, misc items. Food and drink given patient.

## 2014-08-23 NOTE — Tx Team (Signed)
Initial Interdisciplinary Treatment Plan   PATIENT STRESSORS: Financial difficulties Medication change or noncompliance Occupational concerns Substance abuse   PATIENT STRENGTHS: Ability for insight Average or above average intelligence Capable of independent living MetallurgistCommunication skills Financial means General fund of knowledge Motivation for treatment/growth Physical Health Supportive family/friends   PROBLEM LIST: Problem List/Patient Goals Date to be addressed Date deferred Reason deferred Estimated date of resolution  "suicidal thoughts" 08/23/2014   D/c        ":subsance abuse" 08/23/2014   D/c        "depression" 08/23/2014   D/c                           DISCHARGE CRITERIA:  Ability to meet basic life and health needs Adequate post-discharge living arrangements Improved stabilization in mood, thinking, and/or behavior Medical problems require only outpatient monitoring Motivation to continue treatment in a less acute level of care Need for constant or close observation no longer present Reduction of life-threatening or endangering symptoms to within safe limits Safe-care adequate arrangements made Verbal commitment to aftercare and medication compliance Withdrawal symptoms are absent or subacute and managed without 24-hour nursing intervention  PRELIMINARY DISCHARGE PLAN: Attend aftercare/continuing care group Attend PHP/IOP Attend 12-step recovery group Outpatient therapy Participate in family therapy Return to previous living arrangement Return to previous work or school arrangements  PATIENT/FAMIILY INVOLVEMENT: This treatment plan has been presented to and reviewed with the patient, [name]"suici.  The patient and family have been given the opportunity to ask questions and make suggestions.  Earline MayotteKnight, Rudolpho Claxton Shephard 08/23/2014, 7:40 PM

## 2014-08-23 NOTE — Progress Notes (Signed)
Writer met with patient after report and observed him lying in bed resting. He reports that his brother had visited him and he was settling in to the unit. Patient c/o withdrawal symptoms and was medicated according to symptoms. He denies si/hi/a/v hallucinations. Support and encouragement given. Writer left patient to rest, he did not attend group this evening. Safety maintained on unit with 15 min checks.   

## 2014-08-23 NOTE — Progress Notes (Signed)
Psychoeducational Group Note  Date:  08/23/2014 Time:  2100 Group Topic/Focus:  wrap up group  Participation Level: Did Not Attend  Participation Quality:  Not Applicable  Affect:  Not Applicable  Cognitive:  Not Applicable  Insight:  Not Applicable  Engagement in Group: Not Applicable  Additional Comments:  Pt was notified that group was beginning but pt was newly admitted to unit and remained in his bed sleeping.   Shelah LewandowskySquires, Alexas Basulto Carol 08/23/2014, 11:07 PM

## 2014-08-24 DIAGNOSIS — F1994 Other psychoactive substance use, unspecified with psychoactive substance-induced mood disorder: Secondary | ICD-10-CM

## 2014-08-24 DIAGNOSIS — F1920 Other psychoactive substance dependence, uncomplicated: Secondary | ICD-10-CM

## 2014-08-24 DIAGNOSIS — F3289 Other specified depressive episodes: Secondary | ICD-10-CM | POA: Insufficient documentation

## 2014-08-24 MED ORDER — TRAZODONE HCL 100 MG PO TABS
100.0000 mg | ORAL_TABLET | Freq: Every evening | ORAL | Status: DC | PRN
  Administered 2014-08-24 – 2014-08-27 (×4): 100 mg via ORAL
  Filled 2014-08-24 (×2): qty 1
  Filled 2014-08-24: qty 14
  Filled 2014-08-24 (×2): qty 1

## 2014-08-24 MED ORDER — HYDROXYZINE HCL 25 MG PO TABS
25.0000 mg | ORAL_TABLET | Freq: Once | ORAL | Status: AC
  Administered 2014-08-25: 25 mg via ORAL
  Filled 2014-08-24: qty 1

## 2014-08-24 NOTE — [facility] Suicide Risk Assessment (Signed)
[facility] Family/Significant Other Suicide Prevention Education  Suicide Prevention Education:   Patient Refusal for Family/Significant Other Suicide Prevention Education: The patient has refused to provide written consent for family/significant other to be provided Family/Significant Other Suicide Prevention Education during admission and/or prior to discharge.  Physician notified.  CSW provided suicide prevention information with patient.    The suicide prevention education provided includes the following:  Suicide risk factors  Suicide prevention and interventions  National Suicide Hotline telephone number  [facility] assessment telephone number  [facility] [place] Emergency Assistance 911  [facility] and/or Residential Mobile Crisis Unit telephone number   Reyes IvanChelsea Horton, [place] 08/24/2014 10:54 AM

## 2014-08-24 NOTE — Progress Notes (Signed)
Psychoeducational Group Note  Date: 08/24/2014 Time:0930 Group Topic/Focus:  Gratefulness:  The focus of this group is to help patients identify what two things they are most grateful for in their lives. What helps ground them and to center them on their work to their recovery.  Participation Level:  Active  Participation Quality:  Appropriate  Affect:  Appropriate  Cognitive:  Oriented  Insight:  Improving  Engagement in Group:  Engaged  Additional Comments:  Pt participated in the group and shared thoughts and feelings.  Laqueshia Cihlar A  

## 2014-08-24 NOTE — Progress Notes (Signed)
Patient did attend the evening speaker AA meeting.  

## 2014-08-24 NOTE — B[facility] Counselor (Signed)
Adult Comprehensive Assessment  Patient ID: Srihari Shellhammer, male   DOB: [birth year], 24 y.o.   MRN: [id number]  Information Source: Information source: Patient  Current Stressors:  Substance abuse: heroin, meth and crack cocaine abuse  Living/Environment/Situation:  Living Arrangements: Parent Living conditions (as described by patient or guardian): Pt lives with his parents in [place].  Pt reports parents are supportive but want him to get help for his substance abuse.   How long has patient lived in current situation?: off and on 6 months What is atmosphere in current home: Supportive, Loving, Comfortable  Family History:  Marital status: Single Does patient have children?: No  Childhood History:  By whom was/is the patient raised?: Both parents Additional childhood history information: Pt reports having a good childhood with no significant issues.  Description of patient's relationship with caregiver when they were a child: Pt reports getting along well with parents growing up.   Patient's description of current relationship with people who raised him/her: Pt states parents are still supportive but want him to get help. Does patient have siblings?: Yes Number of Siblings: 1 Description of patient's current relationship with siblings: pt reports being close to brother Did patient suffer any verbal/emotional/physical/sexual abuse as a child?: No Did patient suffer from severe childhood neglect?: No Has patient ever been sexually abused/assaulted/raped as an adolescent or adult?: No Was the patient ever a victim of a crime or a disaster?: No Witnessed domestic violence?: No Has patient been effected by domestic violence as an adult?: No  Education:  Highest grade of school patient has completed: graduated high school Currently a Consulting civil engineer?: No Learning disability?: No  Employment/Work Situation:   Employment situation: Employed 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 How long has patient been employed?: 2 years Patient's job has been impacted by current illness: Yes Describe how patient's job has been impacted: at risk of losing job due to substance abuse What is the longest time patient has a held a job?: current job Where was the patient employed at that time?: 2 years Has patient ever been in the [employer]?: No Has patient ever served in combat?: No  Financial Resources:   Financial resources: Income from employment, Private insurance Does patient have a representative payee or guardian?: No  Alcohol/Substance Abuse:   What has been your use of drugs/alcohol within the last 12 months?: Heroin - 2 grams daily for the last 2 years - IV use, crack cocaine - 2 times per week for years, meth - 2 times per month for years If attempted suicide, did drugs/alcohol play a role in this?: No Alcohol/Substance Abuse Treatment Hx: Denies past history Has alcohol/substance abuse ever caused legal problems?: No  Social Support System:   Forensic psychologist System: Good Describe Community Support System: parents and a family friend are pt's main support Type of faith/religion: none reported How does patient's faith help to cope with current illness?: N/A  Leisure/Recreation:   Leisure and Hobbies: "getting high"  Strengths/Needs:   What things does the patient do well?: unable to name anything right now In what areas does patient struggle / problems for patient: substance abuse, depression, SI  Discharge Plan:   Does patient have access to transportation?: Yes Will patient be returning to same living situation after discharge?: No Plan for living situation after discharge: plans to go to [facility] inpatient treatment center after discharge Currently receiving community mental health services: No If no, would patient like referral for services when  discharged?: Yes (What county?) ([facility]) Does patient have financial  barriers related to discharge medications?: No  Summary/Recommendations:     Patient is a [age] year old Caucasian Male with a diagnosis of Opiate Use Disorder, severe, Cocaine Use Disorder, severe, Methamphetamine Use Disorder.  [place].  Pt states that he was suicidal because he has lost everything to his drug use.  Pt states that he came to get off of heroin, which he reports using 2 grams daily for the last 2 years.  Pt states that he already plans to go to [facility] inpatient treatment center after discharge, which he reports his parents and a family friend will transport him to.  Patient will benefit from crisis stabilization, medication evaluation, group therapy and psycho education in addition to case management for discharge planning. Discharge Process and Patient Expectations information sheet signed by patient, witnessed by writer and inserted in patient's shadow chart.    Pt is open to referral to Quit[facility].  CSW will fill out referral form and leave for weekday CSW to send off at discharge.    Horton, Salome Arnthelsea Nicole. 08/24/2014

## 2014-08-24 NOTE — Progress Notes (Signed)
D) Pt states that he is feeling "horrible". States he was using 2 Gms of heroine daily. States "It was so very stupid. I can't believe that I let myself get in such a mess". Pt has attended some of the groups today and was attentive and was able to participate some. When not in group stays in his room resting. Pt  A) Pt provided with a 1:1. Given support, reassurance and praise. Encouragement given.  R) Denies SI and HI.

## 2014-08-24 NOTE — [facility] Suicide Risk Assessment (Signed)
[facility] Ad[facility]   Nursing information obtained from:  Patient Demographic factors:  Male, Adolescent or young adult Current Mental Status:    Loss Factors:    Historical Factors:  Impulsivity Risk Reduction Factors:  Employed, Living with another person, especially a relative Total Time spent with patient: 1.5 hours Principal Problem: <principal problem not specified> Diagnosis:   Patient Active Problem List   Diagnosis Date Noted  . Polysubstance (including opioids) dependence with physiol dependence [F19.20] 08/23/2014  . Sigmoid diverticulitis s/p lap assisted sigmoid colectomy 07/01/14 [K57.32] 07/01/2014  . Diverticulitis [K57.92] 12/06/2013  . Diverticulitis of large intestine without perforation or abscess without bleeding [K57.32] 12/06/2013  . Obesity, unspecified [E66.9] 12/06/2013  . Acute diverticulitis [K57.92] 12/06/2013  . NAUSEA AND VOMITING [R11.2] 02/09/2009  . OTHER SYMPTOMS INVOLVING DIGESTIVE SYSTEM OTHER [R19.8] 02/09/2009     Continued Clinical Symptoms:  Alcohol Use Disorder Identification Test Final Score (AUDIT): 32 The "Alcohol Use Disorders Identification Test", Guidelines for Use in Primary Care, Second Edition.  World Science writerHealth Organization [facility](WHO). Score between 0-7:  no or low risk or alcohol related problems. Score between 8-15:  moderate risk of alcohol related problems. Score between 16-19:  high risk of alcohol related problems. Score 20 or above:  warrants further diagnostic evaluation for alcohol dependence and treatment.   CLINICAL FACTORS:   Severe Anxiety and/or Agitation Dysthymia Alcohol/Substance Abuse/Dependencies More than one psychiatric diagnosis   Musculoskeletal: Strength & Muscle Tone: within normal limits Gait & Station: normal Patient leans: N/A  Psychiatric Specialty Exam: Physical Exam  Constitutional: He appears well-developed and well-nourished. He appears distressed.  HENT:  Head: Normocephalic  and atraumatic.    Review of Systems  Constitutional: Negative for fever.  Respiratory: Negative for cough.   Cardiovascular: Negative for chest pain.  Gastrointestinal: Positive for nausea.  Musculoskeletal: Positive for myalgias.  Skin: Negative for rash.  Neurological: Positive for tremors.  Psychiatric/Behavioral: Positive for depression and substance abuse. The patient is nervous/anxious and has insomnia.     Blood pressure 120/48, pulse 62, temperature 97.5 F (36.4 C), temperature source Oral, resp. rate 20, height 6\' 1"  (1.854 m), weight 110.224 kg (243 lb), SpO2 97 %.Body mass index is 32.07 kg/(m^2).  General Appearance: Casual  Eye Contact::  Fair  Speech:  Slow  Volume:  Normal  Mood:  Dysphoric  Affect:  Constricted  Thought Process:  Coherent  Orientation:  Full (Time, Place, and Person)  Thought Content:  Rumination  Suicidal Thoughts:  No  Homicidal Thoughts:  No  Memory:  Immediate;   Fair Recent;   Fair  Judgement:  Poor  Insight:  Shallow  Psychomotor Activity:  Decreased  Concentration:  Fair  Recall:  [employer]  Fund of Knowledge:Fair  Language: Fair  Akathisia:  Negative  Handed:  Right  AIMS (if indicated):     Assets:  Desire for Improvement Financial Resources/Insurance Social Support  Sleep:  Number of Hours: 6  Cognition: WNL  ADL's:  Intact     COGNITIVE FEATURES THAT CONTRIBUTE TO RISK:  Closed-mindedness and Polarized thinking    SUICIDE RISK:   Moderate:  Frequent suicidal ideation with limited intensity, and duration, some specificity in terms of plans, no associated intent, good self-control, limited dysphoria/symptomatology, some risk factors present, and identifiable protective factors, including available and accessible social support.  PLAN OF CARE: Opiate Detox started. Monitor for withdrawals and depression. SA counselling and medication for stabilization of depression .  Medical Decision Making:  Review of Psycho-Social  Stressors (1), Established Problem, Worsening (2), Review of Last Therapy Session (1) and Review of Medication Regimen & Side Effects (2)  I certify that inpatient services furnished can reasonably be expected to improve the patient's condition.   Eddison Searls 08/24/2014, 10:40 AM

## 2014-08-24 NOTE — H&P (Signed)
Psychiatric Admission Assessment Adult  Patient Identification: [name] MRN:  [id number] Date of Evaluation:  08/24/2014 Chief Complaint:  OPIOID USE DISORDER,SEVERE CANNIBIS USE DISORDER,SEVERE MOOD DISORDER Principal Diagnosis: <principal problem not specified>  Diagnosis:   Patient Active Problem List   Diagnosis Date Noted  . Polysubstance (including opioids) dependence with physiol dependence [F19.20] 08/23/2014  . Sigmoid diverticulitis s/p lap assisted sigmoid colectomy 07/01/14 [K57.32] 07/01/2014  . Diverticulitis [K57.92] 12/06/2013  . Diverticulitis of large intestine without perforation or abscess without bleeding [K57.32] 12/06/2013  . Obesity, unspecified [E66.9] 12/06/2013  . Acute diverticulitis [K57.92] 12/06/2013  . NAUSEA AND VOMITING [R11.2] 02/09/2009  . OTHER SYMPTOMS INVOLVING DIGESTIVE SYSTEM OTHER [R19.8] 02/09/2009   History of Present Illness: [name] is a 24 year old Caucasian male. Admitted to [facility] as a walk-in, medically cleared at the [facility]. He reports, "I'm trying very hard to get off Heroin. I have been using 2 grams of heroin daily & smoking crack with it x 2 years. Crack use has been heavy x 1 year. Then, every once in a while, I will use Crystal meth. Drugs make me feel happy. When not using, my life feels like it is not worth living. The only time that I feel suicidal is when I'm not using drugs. But, I have not acted on it or had plans to hurt myself. I have lost everything because of my drug use. I'm tired of it. I'm not depressed, but I'm hurting all over. I don't need any medicine for depression. I'm here to get detox, then will go to [facility] in the [employer] for further treatment after discharge from here".  Elements:  Location:  Polysubstance dependence. Quality:  Body aches, feeling of restlessness,. Severity:  Moderate. Timing:  "Withdrawal symptoms started yesterday, but been using drugs daily x 2 years"  . Duration:  Chronic. Context:  "I have using heroin & shooting crack daily x 2 years, I feel it is time to quit".  Associated Signs/Symptoms:  Depression Symptoms:  feelings of worthlessness/guilt,  (Hypo) Manic Symptoms:  Impulsivity,  Anxiety Symptoms:  Excessive Worry,  Psychotic Symptoms:  Patient denies any symptoms  PTSD Symptoms: NA  Total Time spent with patient: 1 hour  Past Medical History:  Past Medical History  Diagnosis Date  . Diverticulitis   . Attention deficit disorder   . Hiccups     pt states hiccups during sleep   . Anxiety   . Depression     Past Surgical History  Procedure Laterality Date  . Colonscopy       removed polyps   . Laparoscopic partial colectomy N/A 07/01/2014    Procedure: LAPAROSCOPIC ASSISTED SIGMOID COLECTOMY WITH MOBILIZATION OF SPLENIC FLEXURE;  Surgeon: Avel Peace, MD;  Location: [facility];  Service: General;  Laterality: N/A;   Family History: History reviewed. No pertinent family history. Social History:  History  Alcohol Use  . Yes    Comment: two 12 oz cans beer daily     History  Drug Use  . Yes  . Special: "Crack" cocaine, Heroin, Methamphetamines, Marijuana    History   Social History  . Marital Status: Single    Spouse Name: N/A  . Number of Children: N/A  . Years of Education: N/A   Social History Main Topics  . Smoking status: Never Smoker   . Smokeless tobacco: Current User    Types: Chew  . Alcohol Use: Yes     Comment: two 12 oz cans  beer daily  . Drug Use: Yes    Special: "Crack" cocaine, Heroin, Methamphetamines, Marijuana  . Sexual Activity: Yes    Birth Control/ Protection: Condom   Other Topics Concern  . None   Social History Narrative   Additional Social History:    Pain Medications: none Prescriptions: adderall      hydrocodone Over the Counter: none History of alcohol / drug use?: Yes Longest period of sobriety (when/how long): unknown Negative Consequences of Use:  Surveyor, quantityinancial, Work / Programmer, multimediachool Withdrawal Symptoms: Other (Comment) (anxiety   depression) Name of Substance 1: heroin 1 - Age of First Use: 24 yrs old 1 - Amount (size/oz): 1-2 grams daily 1 - Frequency: daily 1 - Duration: 2 yrs ago 1 - Last Use / Amount: today Name of Substance 2: crack cocaine 2 - Age of First Use: a long time ago 2 - Amount (size/oz): 1/2 gram  2 - Frequency: daily 2 - Duration: years 2 - Last Use / Amount: last night Name of Substance 3: Meth 3 - Age of First Use: 22 3 - Amount (size/oz): varies 3 - Frequency: 2x a month 3 - Duration: years 3 - Last Use / Amount: Last Sunday-3 shots  Musculoskeletal: Strength & Muscle Tone: within normal limits Gait & Station: normal Patient leans: N/A  Psychiatric Specialty Exam: Physical Exam  Constitutional: He is oriented to person, place, and time. He appears well-developed.  HENT:  Head: Normocephalic.  Eyes: Pupils are equal, round, and reactive to light.  Neck: Normal range of motion.  Cardiovascular: Normal rate.   Respiratory: Effort normal.  GI: Soft.  Genitourinary:  Denies any issues in this areas  Musculoskeletal: Normal range of motion.  Neurological: He is alert and oriented to person, place, and time.  Skin: Skin is warm and dry.  Psychiatric: His speech is normal and behavior is normal. Thought content normal. His mood appears anxious. His affect is not angry, not blunt and not labile. He expresses impulsivity. He does not exhibit a depressed mood.    Review of Systems  Constitutional: Positive for chills, malaise/fatigue and diaphoresis.  HENT: Negative.   Eyes: Positive for blurred vision.  Respiratory: Negative.   Cardiovascular: Negative.   Genitourinary: Negative.   Musculoskeletal: Positive for myalgias.  Skin: Negative.   Neurological: Positive for dizziness and weakness.  Endo/Heme/Allergies: Negative.   Psychiatric/Behavioral: Positive for substance abuse (Polysubstance dependence).  Negative for depression, suicidal ideas, hallucinations and memory loss. The patient is nervous/anxious and has insomnia.     Blood pressure 120/48, pulse 62, temperature 97.5 F (36.4 C), temperature source Oral, resp. rate 20, height 6\' 1"  (1.854 m), weight 110.224 kg (243 lb), SpO2 97 %.Body mass index is 32.07 kg/(m^2).  General Appearance: Casual  Eye Contact::  Good  Speech:  Clear and Coherent  Volume:  Normal  Mood:  Denies feeling or being depressed  Affect:  Patient appears distressed from opioid withdrawal symptoms.  Thought Process:  Coherent and Goal Directed  Orientation:  Full (Time, Place, and Person)  Thought Content:  Rumination and denies any hallucinations, delusions or paranoia  Suicidal Thoughts:  No  Homicidal Thoughts:  No  Memory:  Grossly intact  Judgement:  Good  Insight:  Present  Psychomotor Activity:  Restlessness  Concentration:  Fair  Recall:  Good  Fund of Knowledge:Fair  Language: Good  Akathisia:  No  Handed:  Right  AIMS (if indicated):     Assets:  Communication Skills Desire for Improvement Physical Health Social  Support  ADL's:  Intact  Cognition: WNL  Sleep:  Number of Hours: 6   Risk to Self: Suicidal Ideation: Yes-Currently Present Suicidal Intent: No-Not Currently/Within Last 6 Months Is patient at risk for suicide?: Yes Suicidal Plan?: No-Not Currently/Within Last 6 Months Access to Means: Yes Specify Access to Suicidal Means: access to streets What has been your use of drugs/alcohol within the last 12 months?: ETOH, Heroin, Crack, and Meth How many times?: 0 Other Self Harm Risks: None Triggers for Past Attempts: None known Intentional Self Injurious Behavior: None Risk to Others: Homicidal Ideation: No Thoughts of Harm to Others: No Current Homicidal Intent: No Current Homicidal Plan: No Access to Homicidal Means: No Identified Victim: None History of harm to others?: No Assessment of Violence: None Noted Violent  Behavior Description: Pt is calm yet anxious  Does patient have access to weapons?: Yes (Comment) (Patient stated he could get guns off of the street) Criminal Charges Pending?: No Does patient have a court date: Yes (To pay traffic fine)  Prior Inpatient Therapy: Prior Inpatient Therapy: No  Prior Outpatient Therapy: Prior Outpatient Therapy: No  Alcohol Screening: 1. How often do you have a drink containing alcohol?: 4 or more times a week 2. How many drinks containing alcohol do you have on a typical day when you are drinking?: 10 or more 3. How often do you have six or more drinks on one occasion?: Daily or almost daily Preliminary Score: 8 4. How often during the last year have you found that you were not able to stop drinking once you had started?: Daily or almost daily 5. How often during the last year have you failed to do what was normally expected from you becasue of drinking?: Daily or almost daily 6. How often during the last year have you needed a first drink in the morning to get yourself going after a heavy drinking session?: Daily or almost daily 7. How often during the last year have you had a feeling of guilt of remorse after drinking?: Daily or almost daily 8. How often during the last year have you been unable to remember what happened the night before because you had been drinking?: Daily or almost daily 9. Have you or someone else been injured as a result of your drinking?: No 10. Has a relative or friend or a doctor or another health worker been concerned about your drinking or suggested you cut down?: No Alcohol Use Disorder Identification Test Final Score (AUDIT): 32  Allergies:   Allergies  Allergen Reactions  . Cephalexin Hives   Lab Results:  Results for orders placed or performed during the hospital encounter of 08/23/14 (from the past 48 hour(s))  TSH     Status: Abnormal   Collection Time: 08/23/14  7:20 PM  Result Value Ref Range   TSH 0.280 (L) 0.350 -  4.500 uIU/mL    Comment: Performed at [facility]   Current Medications: Current Facility-Administered Medications  Medication Dose Route Frequency Provider Last Rate Last Dose  . acetaminophen (TYLENOL) tablet 650 mg  650 mg Oral Q6H PRN Beau Fanny, FNP      . alum & mag hydroxide-simeth (MAALOX/MYLANTA) 200-200-20 MG/5ML suspension 30 mL  30 mL Oral Q4H PRN Beau Fanny, FNP      . cloNIDine (CATAPRES) tablet 0.1 mg  0.1 mg Oral QID Beau Fanny, FNP   0.1 mg at 08/24/14 0805   Followed by  . [START ON 08/25/2014] cloNIDine (  CATAPRES) tablet 0.1 mg  0.1 mg Oral BH-qamhs Beau Fanny, FNP       Followed by  . [START ON 08/28/2014] cloNIDine (CATAPRES) tablet 0.1 mg  0.1 mg Oral QAC breakfast Beau Fanny, FNP      . dicyclomine (BENTYL) tablet 20 mg  20 mg Oral Q6H PRN Beau Fanny, FNP      . hydrOXYzine (ATARAX/VISTARIL) tablet 25 mg  25 mg Oral Q6H PRN Beau Fanny, FNP   25 mg at 08/23/14 1951  . loperamide (IMODIUM) capsule 2-4 mg  2-4 mg Oral PRN Beau Fanny, FNP      . magnesium hydroxide (MILK OF MAGNESIA) suspension 30 mL  30 mL Oral Daily PRN Beau Fanny, FNP      . methocarbamol (ROBAXIN) tablet 500 mg  500 mg Oral Q8H PRN Beau Fanny, FNP   500 mg at 08/23/14 2234  . naproxen (NAPROSYN) tablet 500 mg  500 mg Oral BID PRN Beau Fanny, FNP   500 mg at 08/23/14 1951  . nicotine (NICODERM CQ - dosed in mg/24 hours) patch 21 mg  21 mg Transdermal Daily Rachael Fee, MD   21 mg at 08/24/14 0805  . ondansetron (ZOFRAN-ODT) disintegrating tablet 4 mg  4 mg Oral Q6H PRN Beau Fanny, FNP       PTA Medications: No prescriptions prior to admission   Previous Psychotropic Medications: Yes   Substance Abuse History in the last 12 months:  Yes.    Consequences of Substance Abuse: Medical Consequences:  Liver damage, Possible death by overdose Legal Consequences:  Arrests, jail time, Loss of driving privilege. Family Consequences:  Family  discord, divorce and or separation.  Results for orders placed or performed during the hospital encounter of 08/23/14 (from the past 72 hour(s))  TSH     Status: Abnormal   Collection Time: 08/23/14  7:20 PM  Result Value Ref Range   TSH 0.280 (L) 0.350 - 4.500 uIU/mL    Comment: Performed at [facility]   Observation Level/Precautions:  15 minute checks  Laboratory:  Per ED; UDS + for Opiates, Cocaine & THC  Psychotherapy: Group sessions  Medications: See active medication lists    Consultations: As needed   Discharge Concerns: Sobriety   Estimated LOS: 2-4 days  Other:     Psychological Evaluations: Yes   Treatment Plan Summary: Daily contact with patient to assess and evaluate symptoms and progress in treatment and Medication management:1. Admit for crisis management and stabilization, estimated length of stay 3-5 days.  2. Medication management to reduce current symptoms to base line and improve the patient's overall level of functioning; continue current clonidine detox protocols already in progress. Jaleen declines to be on any other medications. 3. Treat health problems as indicated.  4. Develop treatment plan to decrease risk of relapse upon discharge and the need for readmission.  5. Psycho-social education regarding relapse prevention and self care.  6. Health care follow up as needed for medical problems.  7. Review, reconcile, and reinstate any pertinent home medications for other health issues where appropriate. 8. Call for consults with hospitalist for any additional specialty patient care services as needed.  Medical Decision Making:  New problem, with additional work up planned, Review of Psycho-Social Stressors (1), Review or order clinical lab tests (1), Review and summation of old records (2), Review of Medication Regimen & Side Effects (2) and Review of New Medication or Change in  Dosage (2)  I certify that inpatient services furnished can  reasonably be expected to improve the patient's condition.   Armandina Stammer I, PMHNP-BC 4/10/201610:02 AM I have examined the patient and agreed with the findings of H&P and treatment plan. I have also done suicide assessment on this patient.

## 2014-08-24 NOTE — [facility] Group Notes (Signed)
[facility] Group Notes: (Clinical Social Work)   08/24/2014      Type of Therapy:  Group Therapy   Participation Level:  Did Not Attend despite MHT prompting   Garner Dullea Grossman-Orr, LCSW 08/24/2014, 2:39 PM     

## 2014-08-24 NOTE — Progress Notes (Signed)
D) Pt is down in his room and is shaking. Blood pressures is 104/59 Heart rate 79. State he has tingling in his arm especially at the site where "I shoot up". Pt is aware that he is beginning to go into withdrawals and that he is going to have physical discomforts for the next couple of days. A) Pt given Vistaril, Robaxin and Naprosyn for his discomfort. Pt provided with a 1:1. Educated on what withdrawal symptoms look like and some of the things he can do to help himself. Example, taking a shower and drinking plenty of fluids. Given support and praised for his desire to get clean and to have a new life for himself. R) Pt worried about not sleeping this evening and was assured that he would receive medication to help him with his sleep.

## 2014-08-24 NOTE — Progress Notes (Signed)
Psychoeducational Group Note  Date:  08/24/2014 Time:  1015  Group Topic/Focus:  Making Healthy Choices:   The focus of this group is to help patients identify negative/unhealthy choices they were using prior to admission and identify positive/healthier coping strategies to replace them upon discharge.  Participation Level:  Active  Participation Quality:  Appropriate  Affect:  Appropriate  Cognitive:  Oriented  Insight:  Improving  Engagement in Group:  Engaged  Additional Comments:  Pt engaged and participating in the group. Good input  Maricel Swartzendruber A 08/24/2014 

## 2014-08-24 NOTE — [facility] Group Notes (Signed)
[facility] Group Notes:  (Nursing/MHT/Case Management/Adjunct)  Date:  08/24/2014  Time:  10:13 AM  Type of Therapy:  Psychoeducational Skills  Participation Level:  Did Not Attend  Participation Quality:  Did Not Attend  Affect:  Did Not Attend  Cognitive:  Did Not Attend  Insight:  None  Engagement in Group:  Did Not Attend  Modes of Intervention:  Did Not Attend  Summary of Progress/Problems: Pt did not attend patient self inventory group.    Jacquelyne BalintForrest, Dali Kraner Shanta 08/24/2014, 10:13 AM

## 2014-08-25 DIAGNOSIS — F328 Other depressive episodes: Secondary | ICD-10-CM | POA: Diagnosis not present

## 2014-08-25 DIAGNOSIS — F1994 Other psychoactive substance use, unspecified with psychoactive substance-induced mood disorder: Secondary | ICD-10-CM | POA: Diagnosis not present

## 2014-08-25 MED ORDER — LORAZEPAM 1 MG PO TABS
1.0000 mg | ORAL_TABLET | Freq: Four times a day (QID) | ORAL | Status: DC | PRN
  Administered 2014-08-25 – 2014-08-28 (×8): 1 mg via ORAL
  Filled 2014-08-25 (×8): qty 1

## 2014-08-25 NOTE — Progress Notes (Addendum)
D: Pt has anxious affect and mood.  Pt reports he had a good visit with his brother and a friend.  Pt reports "I feel like I'm starting to feel better.  Dr. Sabra Heck changed my anxiety type medicine and that's working better."  Pt reports "I forced myself to stay up since I didn't get much sleep, went to groups."  Pt reports he plans to go to [facility] at the [employer] right after here."  Pt reports he feels good about this aftercare plan and that the program "usually lasts 8 to 10 months."  Pt denies SI/HI, denies hallucinations.  Pt reports withdrawal symptoms of aching, tremor, anxiety. Pt has been visible in milieu interacting with peers and staff appropriately.  Pt attended evening group.   A: Met with pt 1:1 and provided support and encouragement.  Praised pt for formulating a good aftercare plan and staying focused on treatment.  Medications administered per order.  PRN medication administered for anxiety, pain, and sleep. R: Pt is compliant with medications.  Pt verbally contracts for safety.  Will continue to monitor and assess.

## 2014-08-25 NOTE — Progress Notes (Signed)
[facility] MD Progress Note  08/25/2014 3:50 PM [name]  MRN:  [id number] Subjective:  [name] states he is tired of using. Has not been able to accomplish sobriety for too long. He was accepted to an 18 months program and he plans to go there once he is detox. States he does not know how he feels anymore as he has been under the influence of something mostly opioids for the last 2 years. Does say he experiences a lot of anxiety. He wants to get his life back together. He has distances himself from his family as he does not want to cause them pain as this is not what they were hoping for him. States he has had to "hustle" to support his habit.  Principal Problem: <principal problem not specified> Diagnosis:   Patient Active Problem List   Diagnosis Date Noted  . Substance induced mood disorder [F19.94]   . 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 Polysubstance (including opioids) dependence with physiol dependence [F19.20] 08/23/2014  . Sigmoid diverticulitis s/p lap assisted sigmoid colectomy 07/01/14 [K57.32] 07/01/2014  . Diverticulitis [K57.92] 12/06/2013  . Diverticulitis of large intestine without perforation or abscess without bleeding [K57.32] 12/06/2013  . Obesity, unspecified [E66.9] 12/06/2013  . Acute diverticulitis [K57.92] 12/06/2013  . NAUSEA AND VOMITING [R11.2] 02/09/2009  . OTHER SYMPTOMS INVOLVING DIGESTIVE SYSTEM OTHER [R19.8] 02/09/2009   Total Time spent with patient: 30 minutes   Past Medical History:  Past Medical History  Diagnosis Date  . Diverticulitis   . Attention deficit disorder   . Hiccups     pt states hiccups during sleep   . Anxiety   . Depression     Past Surgical History  Procedure Laterality Date  . Colonscopy       removed polyps   . Laparoscopic partial colectomy N/A 07/01/2014    Procedure: LAPAROSCOPIC ASSISTED SIGMOID COLECTOMY WITH MOBILIZATION OF SPLENIC FLEXURE;  Surgeon: Avel Peace, MD;  Location: [facility];  Service: General;  Laterality: N/A;    Family History: History reviewed. No pertinent family history. Social History:  History  Alcohol Use  . Yes    Comment: two 12 oz cans beer daily     History  Drug Use  . Yes  . Special: "Crack" cocaine, Heroin, Methamphetamines, Marijuana    History   Social History  . Marital Status: Single    Spouse Name: N/A  . Number of Children: N/A  . Years of Education: N/A   Social History Main Topics  . Smoking status: Never Smoker   . Smokeless tobacco: Current User    Types: Chew  . Alcohol Use: Yes     Comment: two 12 oz cans beer daily  . Drug Use: Yes    Special: "Crack" cocaine, Heroin, Methamphetamines, Marijuana  . Sexual Activity: Yes    Birth Control/ Protection: Condom   Other Topics Concern  . None   Social History Narrative   Additional History:    Sleep: Poor  Appetite:  Fair   Assessment:   Musculoskeletal: Strength & Muscle Tone: within normal limits Gait & Station: normal Patient leans: N/A   Psychiatric Specialty Exam: Physical Exam  Review of Systems  Constitutional: Positive for malaise/fatigue.  HENT: Negative.   Eyes: Negative.   Respiratory: Negative.   Cardiovascular: Negative.   Gastrointestinal: Positive for nausea and diarrhea.  Genitourinary: Negative.   Musculoskeletal: Positive for myalgias.  Skin: Negative.   Neurological: Negative.   Endo/Heme/Allergies: Negative.   Psychiatric/Behavioral: Positive for depression and  substance abuse. The patient is nervous/anxious.     Blood pressure 120/62, pulse 77, temperature 97.4 F (36.3 C), temperature source Oral, resp. rate 24, height 6\' 1"  (1.854 m), weight 110.224 kg (243 lb), SpO2 97 %.Body mass index is 32.07 kg/(m^2).  General Appearance: Fairly Groomed  Patent attorneyye Contact::  Fair  Speech:  Clear and Coherent  Volume:  Decreased  Mood:  Anxious and worried  Affect:  anxious worried  Thought Process:  Coherent and Goal Directed  Orientation:  Full (Time, Place, and Person)   Thought Content:  symptoms events worries concerns a sense of shame and guilt for what he has had to do to maintain his habit  Suicidal Thoughts:  No  Homicidal Thoughts:  No  Memory:  Immediate;   Fair Recent;   Fair Remote;   Fair  Judgement:  Fair  Insight:  Present  Psychomotor Activity:  Restlessness  Concentration:  Fair  Recall:  FiservFair  Fund of Knowledge:Fair  Language: Fair  Akathisia:  No  Handed:  Right  AIMS (if indicated):     Assets:  Desire for Improvement  ADL's:  Intact  Cognition: WNL  Sleep:  Number of Hours: 3.5     Current Medications: Current Facility-Administered Medications  Medication Dose Route Frequency Provider Last Rate Last Dose  . acetaminophen (TYLENOL) tablet 650 mg  650 mg Oral Q6H PRN Beau FannyJohn C Withrow, FNP   650 mg at 08/24/14 2130  . alum & mag hydroxide-simeth (MAALOX/MYLANTA) 200-200-20 MG/5ML suspension 30 mL  30 mL Oral Q4H PRN Beau FannyJohn C Withrow, FNP      . cloNIDine (CATAPRES) tablet 0.1 mg  0.1 mg Oral BH-qamhs Beau FannyJohn C Withrow, FNP       Followed by  . [START ON 08/28/2014] cloNIDine (CATAPRES) tablet 0.1 mg  0.1 mg Oral QAC breakfast Beau FannyJohn C Withrow, FNP      . dicyclomine (BENTYL) tablet 20 mg  20 mg Oral Q6H PRN Beau FannyJohn C Withrow, FNP      . hydrOXYzine (ATARAX/VISTARIL) tablet 25 mg  25 mg Oral Q6H PRN Beau FannyJohn C Withrow, FNP   25 mg at 08/25/14 0842  . loperamide (IMODIUM) capsule 2-4 mg  2-4 mg Oral PRN Beau FannyJohn C Withrow, FNP      . LORazepam (ATIVAN) tablet 1 mg  1 mg Oral Q6H PRN Rachael FeeIrving A Starletta Houchin, MD   1 mg at 08/25/14 1048  . magnesium hydroxide (MILK OF MAGNESIA) suspension 30 mL  30 mL Oral Daily PRN Beau FannyJohn C Withrow, FNP      . methocarbamol (ROBAXIN) tablet 500 mg  500 mg Oral Q8H PRN Beau FannyJohn C Withrow, FNP   500 mg at 08/24/14 1834  . naproxen (NAPROSYN) tablet 500 mg  500 mg Oral BID PRN Beau FannyJohn C Withrow, FNP   500 mg at 08/24/14 1834  . nicotine (NICODERM CQ - dosed in mg/24 hours) patch 21 mg  21 mg Transdermal Daily Rachael FeeIrving A Sarabella Caprio, MD   21 mg at  08/25/14 [id number]  . ondansetron (ZOFRAN-ODT) disintegrating tablet 4 mg  4 mg Oral Q6H PRN Beau FannyJohn C Withrow, FNP      . traZODone (DESYREL) tablet 100 mg  100 mg Oral QHS PRN Worthy FlankIjeoma E Nwaeze, NP   100 mg at 08/24/14 2220    Lab Results:  Results for orders placed or performed during the hospital encounter of 08/23/14 (from the past 48 hour(s))  TSH     Status: Abnormal   Collection Time: 08/23/14  7:20 PM  Result  Value Ref Range   TSH 0.280 (L) 0.350 - 4.500 uIU/mL    Comment: Performed at [facility]    Physical Findings: AIMS: Facial and Oral Movements Muscles of Facial Expression: None, normal Lips and Perioral Area: None, normal Jaw: None, normal Tongue: None, normal,Extremity Movements Upper (arms, wrists, hands, fingers): None, normal Lower (legs, knees, ankles, toes): None, normal, Trunk Movements Neck, shoulders, hips: None, normal, Overall Severity Severity of abnormal movements (highest score from questions above): None, normal Incapacitation due to abnormal movements: None, normal Patient's awareness of abnormal movements (rate only patient's report): No Awareness, Dental Status Current problems with teeth and/or dentures?: No Does patient usually wear dentures?: No  CIWA:  CIWA-Ar Total: 1 COWS:  COWS Total Score: 5  Treatment Plan Summary: Daily contact with patient to assess and evaluate symptoms and progress in treatment and Medication management  Supportive approach/coping skills Polysubstance Dependence including opioids: will continue the Clonidine detox protocol Will work a relapse prevention plan ( he is scheduled to be admitted to a 18 months program once he is detox) Will reassess and address the co morbidities Anxiety: seems to be compounded by the withdrawal ; will use Ativan on a PRN basis to provide some relief High TSH; will check his free T3, T4 Medical Decision Making:  Review of Psycho-Social Stressors (1), Review or order clinical  lab tests (1), Review of Medication Regimen & Side Effects (2) and Review of New Medication or Change in Dosage (2)     Rainen Vanrossum A 08/25/2014, 3:50 PM

## 2014-08-25 NOTE — Tx Team (Signed)
Interdisciplinary Treatment Plan Update (Adult)   Date: 08/25/2014   Time Reviewed: 11:25 AM  Progress in Treatment:  Attending groups: Yes  Participating in groups:  Yes  Taking medication as prescribed: Yes  Tolerating medication: Yes  Family/Significant othe contact made: Pt refused. SPE completed with pt.   Patient understands diagnosis: Yes, AEB seeking treatment for heroin detox, crack cocaine abuse, anxiety/mood instability, and for med stabilization.  Discussing patient identified problems/goals with staff: Yes  Medical problems stabilized or resolved: Yes  Denies suicidal/homicidal ideation: Yes during group/self report.  Patient has not harmed self or Others: Yes  New problem(s) identified:  Discharge Plan or Barriers:  Additional comments: [name] is a 24 year old Caucasian male. [facility], [facility]. He reports, "I'm trying very hard to get off Heroin. I have been using 2 grams of heroin daily & smoking crack with it x 2 years. Crack use has been heavy x 1 year. Then, every once in a while, I will use Crystal meth. Drugs make me feel happy. When not using, my life feels like it is not worth living. The only time that I feel suicidal is when I'm not using drugs. But, I have not acted on it or had plans to hurt myself. I have lost everything because of my drug use. I'm tired of it. I'm not depressed, but I'm hurting all over. I don't need any medicine for depression. I'm here to get detox, then will [place] [facility] [place] treatment after discharge from here". Reason for Continuation of Hospitalization: Clonidine taper-withdrawals Mood instability/anxiety Med stabilization  Estimated length of stay: 1-3 days  For review of initial/current patient goals, please see plan of care.  Attendees:  Patient:    Family:    Physician: Geoffery LyonsIrving Lugo MD 08/25/2014 11:27 AM   Nursing: Harlow OhmsBeverly, Ronecia, Vivian RN  08/25/2014 11:27 AM   Clinical Social Worker Shamecca Whitebread Smart, LCSWA  08/25/2014. 11:27 AM   Other: Gerilyn PilgrimQuylle H. LCSWEarley Abide; Kristin D. LCSWA 08/25/2014 11:27 AM   Other: Darden DatesJennifer C. Nurse CM 08/25/2014 11:27 AM   Other: Liliane Badeolora Sutton, Community Care Coordinator  08/25/2014 11:27 AM   Other:    Scribe for Treatment Team:  The Sherwin-WilliamsHeather Smart LCSWA 08/25/2014 11:27 AM

## 2014-08-25 NOTE — Progress Notes (Signed)
D: Patient's affect appropriate to circumstance and mood is anxious. He reported on the self inventory sheet that his sleep and ability to concentrate are both poor, appetite is fair and energy level is low. Patient rates depression "4", feelings of hopelessness "0" and anxiety "6". He's participating in groups and interactive with peers in the dayroom. In compliance with the current medication regimen.  A: Support and encouragement provided to patient. Scheduled medications given per MD orders. Maintain Q15 minute checks for safety.  R: Patient receptive. Denies SI/HI and AVH. Patient remains safe on the hall.

## 2014-08-25 NOTE — [facility] Group Notes (Signed)
[facility]   08/25/2014 11:23 AM  Participation Quality:  Appropriate   Mood/Affect:  Anxious  Depression Rating:  3  Anxiety Rating:  8  Thoughts of Suicide:  No Will you contract for safety?   Yes  Current AVH:  No  Plan for Discharge/Comments:  Pt reports that [place] [facility] [place] and must detox first. Pt reports "severe" withdrawals and high anxiety today. "I'm not ready to go yet." Pt reports poor sleep last night.   Transportation Means: parents   Supports: parents   Smart, OncologistHeather LCSWA

## 2014-08-25 NOTE — Progress Notes (Signed)
D: Pt has anxious affect and mood.  Pt reports he plans to "attend more group sessions tomorrow."  Pt has been restless and fidgety.  Pt denies SI/HI, denies hallucinations.  Pt has been visible in the milieu interacting with peers and staff appropriately.  Pt attended evening group.  He reported difficulty sleeping.   A: Introduced self to pt.  Met with pt 1:1 and provided support and encouragement.  On-call provider notified that pt reported difficulty sleeping.  Trazodone 100 mg PO PRN ordered and administered.  Vistaril 25 mg POX1 ordered and administered.  PRN medication administered for pain.  PO fluids encouraged and provided.      R: Pt is compliant with medications.  Pt verbally contracts for safety.  At this time, pt is still awake.  Will continue to monitor and assess.

## 2014-08-25 NOTE — Plan of Care (Signed)
Problem: Alteration in mood & ability to function due to Goal: LTG-Pt reports reduction in suicidal thoughts (Patient reports reduction in suicidal thoughts and is able to verbalize a safety plan for whenever patient is feeling suicidal)  Outcome: Progressing Pt denies SI this shift and verbally contracts for safety.    Problem: Diagnosis: Increased Risk For Suicide Attempt Goal: STG-Patient Will Attend All Groups On The Unit Outcome: Progressing Pt attended evening group on 08/25/14.  He reported that he attended groups during the day.

## 2014-08-25 NOTE — [facility] Group Notes (Signed)
[facility] LCSW Group Therapy  08/25/2014 2:46 PM  Type of Therapy:  Group Therapy  Participation Level:  Minimal  Participation Quality:  Attentive  Affect:  Anxious  Cognitive:  Alert  Insight:  Improving  Engagement in Therapy:  Limited  Modes of Intervention:  Confrontation, Discussion, Education, Exploration, Limit-setting, Rapport Building, Dance movement psychotherapisteality Testing, Socialization and Support  Summary of Progress/Problems: Today's Topic: Overcoming Obstacles. Pt identified obstacles faced currently and processed barriers involved in overcoming these obstacles. Pt identified steps necessary for overcoming these obstacles and explored motivation (internal and external) for facing these difficulties head on. Pt further identified one area of concern in their lives and chose a skill of focus pulled from their "toolbox." [name] was attentive during today's processing group but did not actively participate in group discussion unless prompted by CSW. Pt continues to appear anxious and uncomfortable in the group setting. He shared that he cannot think of any obstacles currently but stated that his withdrawal symptoms were difficult to manage. [name] identified his primary goal as "detoxing and going straight into treatment [facility] Pt reports that his transportation is lined up (parents) and that he has full support from his family.   Smart, [name] LCSWA  08/25/2014, 2:46 PM

## 2014-08-25 NOTE — Plan of Care (Signed)
Problem: Alteration in mood & ability to function due to Goal: STG-Patient will report withdrawal symptoms Outcome: Progressing Pt reported withdrawal symptoms of anxiety and feeling restless.

## 2014-08-26 DIAGNOSIS — F1994 Other psychoactive substance use, unspecified with psychoactive substance-induced mood disorder: Secondary | ICD-10-CM | POA: Diagnosis not present

## 2014-08-26 DIAGNOSIS — F328 Other depressive episodes: Secondary | ICD-10-CM | POA: Diagnosis not present

## 2014-08-26 LAB — T4, FREE: Free T4: 1.23 ng/dL (ref 0.80–1.80)

## 2014-08-26 MED ORDER — METHOCARBAMOL 500 MG PO TABS
500.0000 mg | ORAL_TABLET | Freq: Four times a day (QID) | ORAL | Status: DC | PRN
  Administered 2014-08-26 – 2014-08-28 (×4): 500 mg via ORAL
  Filled 2014-08-26 (×2): qty 1
  Filled 2014-08-26: qty 12
  Filled 2014-08-26 (×2): qty 1

## 2014-08-26 NOTE — Progress Notes (Signed)
[facility]  08/26/2014 4:55 PM [name]  MRN:  [id number] Subjective:  [name] continues to be detox. He is endorsing aches, pains. He states he is committed to abstinence. He admits that he has to remind himself that the withdrawal will be gone in few days. Admits he has had times when he thought about giving up.  He is looking forward to going to the residential treatment program as states he is ready. He admits he cant continue to live the way he is now. Principal Problem: <principal problem not specified> Diagnosis:   Patient Active Problem List   Diagnosis Date Noted  . Substance induced mood disorder [F19.94]   . 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 Polysubstance (including opioids) dependence with physiol dependence [F19.20] 08/23/2014  . Sigmoid diverticulitis s/p lap assisted sigmoid colectomy 07/01/14 [K57.32] 07/01/2014  . Diverticulitis [K57.92] 12/06/2013  . Diverticulitis of large intestine without perforation or abscess without bleeding [K57.32] 12/06/2013  . Obesity, unspecified [E66.9] 12/06/2013  . Acute diverticulitis [K57.92] 12/06/2013  . NAUSEA AND VOMITING [R11.2] 02/09/2009  . OTHER SYMPTOMS INVOLVING DIGESTIVE SYSTEM OTHER [R19.8] 02/09/2009   Total Time spent with patient: 30 minutes   Past Medical History:  Past Medical History  Diagnosis Date  . Diverticulitis   . Attention deficit disorder   . Hiccups     pt states hiccups during sleep   . Anxiety   . Depression     Past Surgical History  Procedure Laterality Date  . Colonscopy       removed polyps   . Laparoscopic partial colectomy N/A 07/01/2014    Procedure: LAPAROSCOPIC ASSISTED SIGMOID COLECTOMY WITH MOBILIZATION OF SPLENIC FLEXURE;  Surgeon: Avel Peaceodd Rosenbower, MD;  Location: [facility];  Service: General;  Laterality: N/A;   Family History: History reviewed. No pertinent family history. Social History:  History  Alcohol Use  . Yes    Comment: two 12 oz cans beer daily     History   Drug Use  . Yes  . Special: "Crack" cocaine, Heroin, Methamphetamines, Marijuana    History   Social History  . Marital Status: Single    Spouse Name: N/A  . Number of Children: N/A  . Years of Education: N/A   Social History Main Topics  . Smoking status: Never Smoker   . Smokeless tobacco: Current User    Types: Chew  . Alcohol Use: Yes     Comment: two 12 oz cans beer daily  . Drug Use: Yes    Special: "Crack" cocaine, Heroin, Methamphetamines, Marijuana  . Sexual Activity: Yes    Birth Control/ Protection: Condom   Other Topics Concern  . None   Social History Narrative   Additional History:    Sleep: Fair  Appetite:  Fair   Assessment:   Musculoskeletal: Strength & Muscle Tone: within normal limits Gait & Station: normal Patient leans: N/A   Psychiatric Specialty Exam: Physical Exam  Review of Systems  Constitutional: Positive for malaise/fatigue.  HENT: Negative.   Eyes: Negative.   Respiratory: Negative.   Cardiovascular: Negative.   Gastrointestinal: Negative.   Genitourinary: Negative.   Musculoskeletal: Positive for myalgias and joint pain.  Skin: Negative.   Neurological: Negative.   Endo/Heme/Allergies: Negative.   Psychiatric/Behavioral: Positive for substance abuse. The patient is nervous/anxious.     Blood pressure 133/59, pulse 92, temperature 97.5 F (36.4 C), temperature source Oral, resp. rate 18, height 6\' 1"  (1.854 m), weight 110.224 kg (243 lb), SpO2 97 %.Body mass index  is 32.07 kg/(m^2).  General Appearance: Fairly Groomed  Patent attorney::  Fair  Speech:  Clear and Coherent  Volume:  fluctuates  Mood:  Anxious and worried  Affect:  sad, anxious worried  Thought Process:  Coherent and Goal Directed  Orientation:  Full (Time, Place, and Person)  Thought Content:  symptoms events worries concerns  Suicidal Thoughts:  No  Homicidal Thoughts:  No  Memory:  Immediate;   Fair Recent;   Fair Remote;   Fair  Judgement:  Fair   Insight:  Present  Psychomotor Activity:  Restlessness  Concentration:  Fair  Recall:  [employer] of Knowledge:Fair  Language: Fair  Akathisia:  No  Handed:  Right  AIMS (if indicated):     Assets:  Desire for Improvement Social Support  ADL's:  Intact  Cognition: WNL  Sleep:  Number of Hours: 6.75     Current Medications: Current Facility-Administered Medications  Medication Dose Route Frequency Provider Last Rate Last Dose  . acetaminophen (TYLENOL) tablet 650 mg  650 mg Oral Q6H PRN Beau Fanny, FNP   650 mg at 08/26/14 1545  . alum & mag hydroxide-simeth (MAALOX/MYLANTA) 200-200-20 MG/5ML suspension 30 mL  30 mL Oral Q4H PRN Beau Fanny, FNP      . cloNIDine (CATAPRES) tablet 0.1 mg  0.1 mg Oral BH-qamhs Beau Fanny, FNP   0.1 mg at 08/26/14 1610   Followed by  . [START ON 08/28/2014] cloNIDine (CATAPRES) tablet 0.1 mg  0.1 mg Oral QAC breakfast Beau Fanny, FNP      . dicyclomine (BENTYL) tablet 20 mg  20 mg Oral Q6H PRN Beau Fanny, FNP      . hydrOXYzine (ATARAX/VISTARIL) tablet 25 mg  25 mg Oral Q6H PRN Beau Fanny, FNP   25 mg at 08/26/14 1102  . loperamide (IMODIUM) capsule 2-4 mg  2-4 mg Oral PRN Beau Fanny, FNP      . LORazepam (ATIVAN) tablet 1 mg  1 mg Oral Q6H PRN Rachael Fee, MD   1 mg at 08/26/14 1435  . magnesium hydroxide (MILK OF MAGNESIA) suspension 30 mL  30 mL Oral Daily PRN Beau Fanny, FNP      . methocarbamol (ROBAXIN) tablet 500 mg  500 mg Oral Q6H PRN Rachael Fee, MD   500 mg at 08/26/14 1102  . naproxen (NAPROSYN) tablet 500 mg  500 mg Oral BID PRN Beau Fanny, FNP   500 mg at 08/26/14 1102  . nicotine (NICODERM CQ - dosed in mg/24 hours) patch 21 mg  21 mg Transdermal Daily Rachael Fee, MD   21 mg at 08/26/14 0807  . ondansetron (ZOFRAN-ODT) disintegrating tablet 4 mg  4 mg Oral Q6H PRN Beau Fanny, FNP      . traZODone (DESYREL) tablet 100 mg  100 mg Oral QHS PRN Worthy Flank, NP   100 mg at 08/25/14 2216     Lab Results:  Results for orders placed or performed during the hospital encounter of 08/23/14 (from the past 48 hour(s))  T4, free     Status: None   Collection Time: 08/25/14  8:03 PM  Result Value Ref Range   Free T4 1.23 0.80 - 1.80 ng/dL    Comment: Performed at [facility]    Physical Findings: AIMS: Facial and Oral Movements Muscles of Facial Expression: None, normal Lips and Perioral Area: None, normal Jaw: None, normal Tongue: None, normal,Extremity Movements Upper (  arms, wrists, hands, fingers): None, normal Lower (legs, knees, ankles, toes): None, normal, Trunk Movements Neck, shoulders, hips: None, normal, Overall Severity Severity of abnormal movements (highest score from questions above): None, normal Incapacitation due to abnormal movements: None, normal Patient's awareness of abnormal movements (rate only patient's report): No Awareness, Dental Status Current problems with teeth and/or dentures?: No Does patient usually wear dentures?: No  CIWA:  CIWA-Ar Total: 1 COWS:  COWS Total Score: 2  Treatment Plan Summary: Daily contact with patient to assess and evaluate symptoms and progress in treatment and Medication management Supportive approach/coping skills Polysubstance Dependence: continue the clonidine detox protocol Work a relapse prevention plan Use CBT/mindfulness ( he will not be able to be on psychotropics when he goes to that program) Address the cravings Continue to encourage and support his plan of going to the residential treatment program Medical Decision Making:  Review of Psycho-Social Stressors (1), Review or order clinical lab tests (1), Review of Medication Regimen & Side Effects (2) and Review of New Medication or Change in Dosage (2)     Ronee Ranganathan A 08/26/2014, 4:55 PM

## 2014-08-26 NOTE — Progress Notes (Signed)
D: Patient in the dayroom sitting and watching television.  Patient states he had a good day.  Patient states he had a better day.  Patient staas his goal for today was to be up all day.  Patient states he met his goal and states he was up all day.  Patient denies SI/HI and denies AVH.   A: Staff to monitor Q 15 mins for safety.  Encouragement and support offered.  Scheduled medications administered per orders.  Ativan adminsitered prn or anxiety.  Trazodone administered prn for sleep.   R: Patient remains safe on the unit.  Patient attended group tonight.  Patient visible on the unit and interacting with peers.  Patient taking administered medications.

## 2014-08-26 NOTE — Clinical Social Work Note (Signed)
CSW left message for admissions coordinator, Antonietta BreachDustin Daniels at [phone] to discuss d/c date and admission process for pt. CSW requested call back at earliest convenience.   The Sherwin-WilliamsHeather Smart, LCSWA 08/26/2014 3:03 PM

## 2014-08-26 NOTE — Progress Notes (Signed)
Recreation Therapy Notes  Animal-Assisted Activity (AAA) Program Checklist/Progress Notes Patient Eligibility Criteria Checklist & Daily Group note for Rec Tx Intervention  Date: 04.12.2016 Time: 2:45pm [address]: [address]    AAA/T Program Assumption of Risk Form signed by Patient/ or Parent Legal Guardian yes  Patient is free of allergies or sever asthma yes  Patient reports no fear of animals yes  Patient reports no history of cruelty to animals yes  Patient understands his/her participation is voluntary yes  Patient washes hands before animal contact yes  Patient washes hands after animal contact yes  Behavioral Response: Engaged, Appropriate   Education: Charity fundraiserHand Washing, Appropriate Animal Interaction   Education Outcome: Acknowledges education.   Clinical Observations/Feedback: Patient actively engaged in session, petting therapy dog appropriately from floor level and interacting with peers appropriately.   Marykay Lexenise L Marqual Mi, LRT/CTRS  Jearl KlinefelterBlanchfield, Beretta Ginsberg L 08/26/2014 4:25 PM

## 2014-08-26 NOTE — Progress Notes (Signed)
D:  Patient's self inventory sheet, patient sleeps good, sleep medication is helpful.  Fair appetite, low energy level, poor concentration.  Rated depression 3, denied hopeless, anxiety #8.  Withdrawals, tremors, chilling, cravings, agitation, irritability.  Denied SI.  Physical problems being lightheadedness, pain.  Worst pain #7, whole body.  Goal is to attend more groups.  Plans to stay moving around.  Does have discharge plans.  No problems anticipated after discharge. A:  Medications administered per MD orders. Emotional support and encouragement. R:  Denied SI and HI, contracts for safety.  Denied A/V hallucinations.  Safety maintained with 15 minute checks.

## 2014-08-26 NOTE — Plan of Care (Signed)
Problem: Consults Goal: Suicide Risk Patient Education (See Patient Education module for education specifics)  Outcome: Completed/Met Date Met:  08/26/14 Nurse discussed suicidal thoughts/coping skills with patient.

## 2014-08-26 NOTE — [facility] Group Notes (Signed)
The focus of this group is to educate the patient on the purpose and policies of crisis stabilization and provide a format to answer questions about their admission.  The group details unit policies and expectations of patients while admitted.  Patient attended 0900 nurse education orientation group this morning.  Patient actively participated, appropriate affect, alert, appropriate insight and engagement.  Today patient will work on 3 goals for discharge.  

## 2014-08-26 NOTE — [facility] Group Notes (Signed)
[facility] LCSW Group Therapy  08/26/2014 2:47 PM  Type of Therapy:  Group Therapy  Participation Level:  Active  Participation Quality:  Attentive  Affect:  Appropriate  Cognitive:  Alert and Oriented  Insight:  Engaged  Engagement in Therapy:  Improving  Modes of Intervention:  Discussion, Education, Exploration, Limit-setting, Rapport Building, Dance movement psychotherapisteality Testing, Socialization and Support  Summary of Progress/Problems: MHA Speaker came to talk about his personal journey with substance abuse and addiction. The pts processed ways by which to relate to the speaker. MHA speaker provided handouts and educational information pertaining to groups and services offered by [facility].   Smart, [name] LCSWA  08/26/2014, 2:47 PM

## 2014-08-27 DIAGNOSIS — F1994 Other psychoactive substance use, unspecified with psychoactive substance-induced mood disorder: Secondary | ICD-10-CM | POA: Diagnosis not present

## 2014-08-27 DIAGNOSIS — F328 Other depressive episodes: Secondary | ICD-10-CM | POA: Diagnosis not present

## 2014-08-27 LAB — T3, FREE: T3 FREE: 2.8 pg/mL (ref 2.0–4.4)

## 2014-08-27 MED ORDER — LORATADINE 10 MG PO TABS
10.0000 mg | ORAL_TABLET | Freq: Every day | ORAL | Status: DC
  Administered 2014-08-27 – 2014-08-28 (×2): 10 mg via ORAL
  Filled 2014-08-27 (×4): qty 1

## 2014-08-27 MED ORDER — PSEUDOEPHEDRINE HCL 60 MG PO TABS
60.0000 mg | ORAL_TABLET | Freq: Once | ORAL | Status: AC
  Administered 2014-08-27: 60 mg via ORAL
  Filled 2014-08-27: qty 1
  Filled 2014-08-27: qty 2

## 2014-08-27 MED ORDER — PSEUDOEPHEDRINE HCL 30 MG PO TABS
60.0000 mg | ORAL_TABLET | ORAL | Status: DC | PRN
  Administered 2014-08-27: 60 mg via ORAL
  Filled 2014-08-27: qty 2

## 2014-08-27 NOTE — Progress Notes (Signed)
D: Patient presents with appropriate affect and anxious mood. He reported on the self inventory sheet that both his sleep and appetite are good, energy level is low and ability to concentrate is poor. Patient rated depression "2", feelings of hopelessness "0" and anxiety "8". He verbalized that he's looking [facility], also stating that he's nervous, but very excited about attending this treatment program. Continues to adhere to the medication regimen.  A: Support and encouragement provided to patient. Administered medications per ordering MD. Monitor Q15 minute checks for safety.  R: Patient receptive. Denies SI/HI and AVH. Patient remains safe on the unit.

## 2014-08-27 NOTE — Progress Notes (Signed)
Recreation Therapy Notes  Date: 04.13.2016 Time: 9:30am Location: [address] Group Room   Group Topic: Stress Management  Goal Area(s) Addresses:  Patient will actively participate in stress management techniques presented during session.   Behavioral Response: Did not attend.   Marykay Lexenise L Keili Hasten, LRT/CTRS  Jearl KlinefelterBlanchfield, Tonjua Rossetti L 08/27/2014 3:16 PM

## 2014-08-27 NOTE — [facility] Group Notes (Signed)
[facility] LCSW Group Therapy  08/27/2014 1:01 PM  Type of Therapy:  Group Therapy  Participation Level:  Active  Participation Quality:  Attentive  Affect:  Appropriate  Cognitive:  Alert and Oriented  Insight:  Engaged  Engagement in Therapy:  Engaged  Modes of Intervention:  Confrontation, Discussion, Education, Exploration, Problem-solving, Rapport Building, Socialization and Support  Summary of Progress/Problems: Emotion Regulation: This group focused on both positive and negative emotion identification and allowed group members to process ways to identify feelings, regulate negative emotions, and find healthy ways to manage internal/external emotions. Group members were asked to reflect on a time when their reaction to an emotion led to a negative outcome and explored how alternative responses using emotion regulation would have benefited them. Group members were also asked to discuss a time when emotion regulation was utilized when a negative emotion was experienced. [name] was attentive and engaged during today's processing group. He shared that he struggles with depression/anxiety and often uses to numb these negative emotions. "it's become the norm for me. And now my body craves the drug whether I'm depressed or not." [name] shows progress in the group setting and improving insight AEB his ability to identify healthy coping skills to take the place of his drug use (self care, walking his dog, etc) and stated that he is "excited and nervous" about going to a 7 month inpatient treatment facility tomorrow to learn more skills to cope with cravings and avoid relapse.   Smart, Aleria Maheu LCSWA  08/27/2014, 1:01 PM

## 2014-08-27 NOTE — [facility] Group Notes (Signed)
[facility]   08/27/2014 9:47 AM  Participation Quality:  Appropriate   Mood/Affect:  Appropriate  Depression Rating:  2  Anxiety Rating:  3-4  Thoughts of Suicide:  No Will you contract for safety?   NA  Current AVH:  No  Plan for Discharge/Comments:  Pt reports that he is "excited and nervous" [facility] [place]. Pt reports minimal withdrawals and states that he feels a lot better today. Good sleep last night. Pt must d/c by 9:30AM Thursday morning for direct admission to program.   Transportation Means: parents and friend taking him to program tomorrow morning.  Supports: parents and friends   Smart, OncologistHeather LCSWA

## 2014-08-27 NOTE — Progress Notes (Signed)
D: Patient in the dayroom talking to peers on approach.  Patient state he had a good day.  Patient states he is anxious but excited about being discharged tomorrow.  Patient states, "I have to do this for myself."  [facility] [place] [facility] [place] [facility].  Patient denies SI/HI and denies AVH.   A: Staff to monitor Q 15 mins for safety.  Encouragement and support offered.  Scheduled medications administered per orders.  Vistaril administered prn for anxiety.   R: Patient remains safe on the unit.  Patient attended group tonight.  Patient visible on the unit and interacting with peers.  Patient taking administered medications.

## 2014-08-27 NOTE — Progress Notes (Signed)
[facility] MD Progress Note  08/27/2014 3:05 PM [name]  MRN:  [id number] Subjective:  [name] is anticipating going into the residential treatment program tomorrow AM. He admits he is anxious but at the same time committed. States he cant continue to do the same thing over and over again. He is concerned about cravings and would like to explore medications that could help with cravings once he is D/C from the program. He cant be on anything while he is there. He is having some nasal congestion today and asked for help.  Principal Problem: <principal problem not specified> Diagnosis:   Patient Active Problem List   Diagnosis Date Noted  . Substance induced mood disorder [F19.94]   . 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 Polysubstance (including opioids) dependence with physiol dependence [F19.20] 08/23/2014  . Sigmoid diverticulitis s/p lap assisted sigmoid colectomy 07/01/14 [K57.32] 07/01/2014  . Diverticulitis [K57.92] 12/06/2013  . Diverticulitis of large intestine without perforation or abscess without bleeding [K57.32] 12/06/2013  . Obesity, unspecified [E66.9] 12/06/2013  . Acute diverticulitis [K57.92] 12/06/2013  . NAUSEA AND VOMITING [R11.2] 02/09/2009  . OTHER SYMPTOMS INVOLVING DIGESTIVE SYSTEM OTHER [R19.8] 02/09/2009   Total Time spent with patient: 30 minutes   Past Medical History:  Past Medical History  Diagnosis Date  . Diverticulitis   . Attention deficit disorder   . Hiccups     pt states hiccups during sleep   . Anxiety   . Depression     Past Surgical History  Procedure Laterality Date  . Colonscopy       removed polyps   . Laparoscopic partial colectomy N/A 07/01/2014    Procedure: LAPAROSCOPIC ASSISTED SIGMOID COLECTOMY WITH MOBILIZATION OF SPLENIC FLEXURE;  Surgeon: Avel Peace, MD;  Location: [facility];  Service: General;  Laterality: N/A;   Family History: History reviewed. No pertinent family history. Social History:  History  Alcohol Use  . Yes     Comment: two 12 oz cans beer daily     History  Drug Use  . Yes  . Special: "Crack" cocaine, Heroin, Methamphetamines, Marijuana    History   Social History  . Marital Status: Single    Spouse Name: N/A  . Number of Children: N/A  . Years of Education: N/A   Social History Main Topics  . Smoking status: Never Smoker   . Smokeless tobacco: Current User    Types: Chew  . Alcohol Use: Yes     Comment: two 12 oz cans beer daily  . Drug Use: Yes    Special: "Crack" cocaine, Heroin, Methamphetamines, Marijuana  . Sexual Activity: Yes    Birth Control/ Protection: Condom   Other Topics Concern  . None   Social History Narrative   Additional History:    Sleep: Fair  Appetite:  Fair   Assessment:   Musculoskeletal: Strength & Muscle Tone: within normal limits Gait & Station: normal Patient leans: N/A   Psychiatric Specialty Exam: Physical Exam  Review of Systems  Constitutional: Negative.   HENT: Positive for congestion.   Eyes: Negative.   Respiratory: Negative.   Cardiovascular: Negative.   Gastrointestinal: Negative.   Genitourinary: Negative.   Musculoskeletal: Positive for myalgias.  Skin: Negative.   Neurological: Negative.   Endo/Heme/Allergies: Negative.   Psychiatric/Behavioral: Positive for substance abuse. The patient is nervous/anxious.     Blood pressure 120/73, pulse 76, temperature 97.5 F (36.4 C), temperature source Oral, resp. rate 20, height  (1.854 m), weight 110.224 kg (243 lb), SpO2 97 %.  Body mass index is 32.07 kg/(m^2).  General Appearance: Fairly Groomed  Patent attorney::  Fair  Speech:  Clear and Coherent  Volume:  Normal  Mood:  Anxious and worried about going to this other program  Affect:  Appropriate  Thought Process:  Coherent and Goal Directed  Orientation:  Full (Time, Place, and Person)  Thought Content:  symptoms events worries concerns  Suicidal Thoughts:  No  Homicidal Thoughts:  No  Memory:  Immediate;    Fair Recent;   Fair Remote;   Fair  Judgement:  Fair  Insight:  Present  Psychomotor Activity:  Normal  Concentration:  Fair  Recall:  [employer] of Knowledge:Fair  Language: Fair  Akathisia:  No  Handed:  Right  AIMS (if indicated):     Assets:  Desire for Improvement Social Support  ADL's:  Intact  Cognition: WNL  Sleep:  Number of Hours: 6.5     Current Medications: Current Facility-Administered Medications  Medication Dose Route Frequency Provider Last Rate Last Dose  . acetaminophen (TYLENOL) tablet 650 mg  650 mg Oral Q6H PRN Beau Fanny, FNP   650 mg at 08/26/14 1545  . alum & mag hydroxide-simeth (MAALOX/MYLANTA) 200-200-20 MG/5ML suspension 30 mL  30 mL Oral Q4H PRN Beau Fanny, FNP      . [START ON 08/28/2014] cloNIDine (CATAPRES) tablet 0.1 mg  0.1 mg Oral QAC breakfast Beau Fanny, FNP      . dicyclomine (BENTYL) tablet 20 mg  20 mg Oral Q6H PRN Beau Fanny, FNP      . hydrOXYzine (ATARAX/VISTARIL) tablet 25 mg  25 mg Oral Q6H PRN Beau Fanny, FNP   25 mg at 08/26/14 1702  . loperamide (IMODIUM) capsule 2-4 mg  2-4 mg Oral PRN Beau Fanny, FNP      . loratadine (CLARITIN) tablet 10 mg  10 mg Oral Daily Rachael Fee, MD   10 mg at 08/27/14 1053  . LORazepam (ATIVAN) tablet 1 mg  1 mg Oral Q6H PRN Rachael Fee, MD   1 mg at 08/27/14 0805  . magnesium hydroxide (MILK OF MAGNESIA) suspension 30 mL  30 mL Oral Daily PRN Beau Fanny, FNP      . methocarbamol (ROBAXIN) tablet 500 mg  500 mg Oral Q6H PRN Rachael Fee, MD   500 mg at 08/27/14 0805  . naproxen (NAPROSYN) tablet 500 mg  500 mg Oral BID PRN Beau Fanny, FNP   500 mg at 08/27/14 0805  . nicotine (NICODERM CQ - dosed in mg/24 hours) patch 21 mg  21 mg Transdermal Daily Rachael Fee, MD   21 mg at 08/27/14 0805  . ondansetron (ZOFRAN-ODT) disintegrating tablet 4 mg  4 mg Oral Q6H PRN Beau Fanny, FNP      . traZODone (DESYREL) tablet 100 mg  100 mg Oral QHS PRN Worthy Flank, NP   100  mg at 08/26/14 2115    Lab Results:  Results for orders placed or performed during the hospital encounter of 08/23/14 (from the past 48 hour(s))  T3, free     Status: None   Collection Time: 08/25/14  8:03 PM  Result Value Ref Range   T3, Free 2.8 2.0 - 4.4 pg/mL    Comment: (NOTE) Performed At: [facility] [address] [place], [place] [id number] Mila Homer MD [id number] Performed at [facility]   T4, free     Status:  None   Collection Time: 08/25/14  8:03 PM  Result Value Ref Range   Free T4 1.23 0.80 - 1.80 ng/dL    Comment: [facility]formed at [facility]    Physical Findings: AIMS: Facial and Oral Movements Muscles of Facial Expression: None, normal Lips and Perioral Area: None, normal Jaw: None, normal Tongue: None, normal,Extremity Movements Upper (arms, wrists, hands, fingers): None, normal Lower (legs, knees, ankles, toes): None, normal, Trunk Movements Neck, shoulders, hips: None, normal, Overall Severity Severity of abnormal movements (highest score from questions above): None, normal Incapacitation due to abnormal movements: None, normal Patient's awareness of abnormal movements (rate only patient's report): No Awareness, Dental Status Current problems with teeth and/or dentures?: No Does patient usually wear dentures?: No  CIWA:  CIWA-Ar Total: 1 COWS:  COWS Total Score: 1  Treatment Plan Summary: Daily contact with patient to assess and evaluate symptoms and progress in treatment and Medication management Supportive approach/coping skills Polysubstance dependence including opioids: complete the detox Cravings: discuss the possibility of starting Naltrexone ( vivitrol) IM when he gets out Will address the cravings meanwhile with CBT/mindfulness Nasal congestion: will use Claritin/Sudafed Will facilitate admission to the residential treatment program in the AM  Medical Decision Making:  Review of Psycho-Social  Stressors (1), Review of Medication Regimen & Side Effects (2) and Review of New Medication or Change in Dosage (2)     Daysie Helf A 08/27/2014, 3:05 PM

## 2014-08-28 ENCOUNTER — Encounter (HOSPITAL_COMMUNITY): Payer: Self-pay | Admitting: Registered Nurse

## 2014-08-28 DIAGNOSIS — F1994 Other psychoactive substance use, unspecified with psychoactive substance-induced mood disorder: Secondary | ICD-10-CM | POA: Diagnosis not present

## 2014-08-28 DIAGNOSIS — F328 Other depressive episodes: Secondary | ICD-10-CM | POA: Diagnosis not present

## 2014-08-28 MED ORDER — TRAZODONE HCL 100 MG PO TABS: 100.0000 mg | ORAL_TABLET | Freq: Every evening | ORAL | Status: DC | PRN

## 2014-08-28 MED ORDER — NAPROXEN 500 MG PO TABS: 500.0000 mg | ORAL_TABLET | Freq: Two times a day (BID) | ORAL | Status: DC | PRN

## 2014-08-28 MED ORDER — METHOCARBAMOL 500 MG PO TABS: 500.0000 mg | ORAL_TABLET | Freq: Four times a day (QID) | ORAL | Status: DC | PRN

## 2014-08-28 MED ORDER — DIPHENHYDRAMINE HCL 50 MG PO CAPS
50.0000 mg | ORAL_CAPSULE | Freq: Once | ORAL | Status: AC
  Administered 2014-08-28: 50 mg via ORAL
  Filled 2014-08-28: qty 1
  Filled 2014-08-28: qty 2

## 2014-08-28 MED ORDER — CLONIDINE HCL 0.1 MG PO TABS: 0.1000 mg | ORAL_TABLET | Freq: Every day | ORAL | Status: DC

## 2014-08-28 MED ORDER — HYDROXYZINE HCL 25 MG PO TABS: 25.0000 mg | ORAL_TABLET | Freq: Four times a day (QID) | ORAL | Status: DC | PRN

## 2014-08-28 NOTE — Progress Notes (Signed)
Adult Psychoeducational Group Note  Date:  08/28/2014 Time:  8:00 pm  Group Topic/Focus:  Wrap-Up Group:   The focus of this group is to help patients review their daily goal of treatment and discuss progress on daily workbooks.  Participation Level:  Active  Participation Quality:  Appropriate, Sharing and Supportive  Affect:  Appropriate  Cognitive:  Appropriate  Insight: Appropriate  Engagement in Group:  Engaged  Modes of Intervention:  Discussion, Education, Socialization and Support  Additional Comments:  Pt stated that his goal was to get through the day. Pt stated that he was leaving tomorrow and that he would be entering a long term treatment facility. Pt stated that he is optimistic but he know that it will be a difficult road ahead.   Cloie Wooden 08/28/2014, 12:00 AM

## 2014-08-28 NOTE — Progress Notes (Signed)
Patient discharged per physician order; patient denies SI/HI and A/V hallucinations; patient received samples, prescriptions, and copy of AVS after it was reviewed; patient had no other questions or concerns at this time; patient verbalized and signed that she received all belongings; patient left the unit ambulatory  

## 2014-08-28 NOTE — BHH Suicide Risk Assessment (Signed)
[facility] Discharge Suicide Risk Assessment   Demographic Factors:  Male, Adolescent or young adult and Caucasian  Total Time spent with patient: 30 minutes  Musculoskeletal: Strength & Muscle Tone: within normal limits Gait & Station: normal Patient leans: N/A  Psychiatric Specialty Exam: Physical Exam  Review of Systems  Constitutional: Negative.   HENT: Positive for congestion.   Eyes: Negative.   Respiratory: Negative.   Cardiovascular: Negative.   Gastrointestinal: Negative.   Genitourinary: Negative.   Musculoskeletal: Negative.   Skin: Negative.   Neurological: Negative.   Endo/Heme/Allergies: Negative.   Psychiatric/Behavioral: The patient is nervous/anxious.     Blood pressure 133/80, pulse 78, temperature 97.3 F (36.3 C), temperature source Oral, resp. rate 16, height  (1.854 m), weight 110.224 kg (243 lb), SpO2 97 %.Body mass index is 32.07 kg/(m^2).  General Appearance: Fairly Groomed  Patent attorney::  Fair  Speech:  Clear and Coherent409  Volume:  Normal  Mood:  Anxious  Affect:  Appropriate  Thought Process:  Coherent and Goal Directed  Orientation:  Full (Time, Place, and Person)  Thought Content:  plans as he moves on, relapse prevention plan  Suicidal Thoughts:  No  Homicidal Thoughts:  No  Memory:  Immediate;   Fair Recent;   Fair Remote;   Fair  Judgement:  Fair  Insight:  Present  Psychomotor Activity:  Normal  Concentration:  Fair  Recall:  [employer] of Knowledge:Fair  Language: Fair  Akathisia:  No  Handed:  Right  AIMS (if indicated):     Assets:  Desire for Improvement Social Support Vocational/Educational  Sleep:  Number of Hours: 6.5  Cognition: WNL  ADL's:  Intact   Have you used any form of tobacco in the last 30 days? (Cigarettes, Smokeless Tobacco, Cigars, and/or Pipes): Yes  Has this patient used any form of tobacco in the last 30 days? (Cigarettes, Smokeless Tobacco, Cigars, and/or Pipes) Yes, A prescription for an  FDA-approved tobacco cessation medication was offered at discharge and the patient refused  Mental Status Per Nursing Assessment::   On Admission:     Current Mental Status by Physician: In full contact with reality. There are no active SI plans or intent. He has some mild residual S/S of withdrawal but they are tolerable. He is going to the [facility] in the [place] for the next 8 months. Once back he plans to pursue further work on his anxiety and ADHD. Wants to be placed on Vivitrol for cravings once he comes back   Loss Factors: NA  Historical Factors: NA  Risk Reduction Factors:   Sense of responsibility to family, Employed and Positive social support  Continued Clinical Symptoms:  Severe Anxiety and/or Agitation Alcohol/Substance Abuse/Dependencies  Cognitive Features That Contribute To Risk:  Closed-mindedness, Polarized thinking and Thought constriction (tunnel vision)    Suicide Risk:  Minimal: No identifiable suicidal ideation.  Patients presenting with no risk factors but with morbid ruminations; may be classified as minimal risk based on the severity of the depressive symptoms  Principal Problem: Substance induced mood disorder Discharge Diagnoses:  Patient Active Problem List   Diagnosis Date Noted  . Substance induced mood disorder [F19.94]   . 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 Polysubstance (including opioids) dependence with physiol dependence [F19.20] 08/23/2014  . Sigmoid diverticulitis s/p lap assisted sigmoid colectomy 07/01/14 [K57.32] 07/01/2014  . Diverticulitis [K57.92] 12/06/2013  . Diverticulitis of large intestine without perforation or abscess without bleeding [K57.32] 12/06/2013  . Obesity, unspecified [  E66.9] 12/06/2013  . Acute diverticulitis [K57.92] 12/06/2013  . NAUSEA AND VOMITING [R11.2] 02/09/2009  . OTHER SYMPTOMS INVOLVING DIGESTIVE SYSTEM OTHER [R19.8] 02/09/2009    Follow-up Information    [facility] up [facility] 08/28/2014.   Why:  You have been accepted for admission on this date at approximately 3:30-4:00PM. You will discharge from the hospital at 10:00AM in order to be transported directly to this facility by family.    Contact information:   [address] [place], [place] [address] Phone: [phone] Fax: [phone]      Plan Of Care/Follow-up recommendations:  Activity:  as tolerated Diet:  regular Follow [facility].  Is patient on multiple antipsychotic therapies at discharge:  No   Has Patient had three or more failed trials of antipsychotic monotherapy by history:  No  Recommended Plan for Multiple Antipsychotic Therapies: NA    Cyniah Gossard A 08/28/2014, 9:14 AM

## 2014-08-28 NOTE — Progress Notes (Signed)
Adult Psychoeducational Group Note  Date:  08/28/2014 Time: 09:15am  Group Topic/Focus:  Orientation:   The focus of this group is to educate the patient on the purpose and policies of crisis stabilization and provide a format to answer questions about their admission.  The group details unit policies and expectations of patients while admitted. Self Esteem Action Plan:   The focus of this group is to help patients create a plan to continue to build self-esteem after discharge.  Participation Level:  Did Not Attend  Participation Quality: n/a  Affect: n/a  Cognitive: n/a  Insight: n/a  Engagement in Group: n/a  Modes of Intervention:  Activity, Discussion, Education and Orientation  Additional Comments:  Pt did not attend group, pt currently discussing discharge with staff.   [name], Deylan Canterbury E 08/28/2014, 10:19 AM

## 2014-08-28 NOTE — Progress Notes (Signed)
  [facility] :  Will you be returning to the same living situation after discharge:  No. Accepted to [facility].  At discharge, do you have transportation home?: Yes,  parents will transport pt to facility at 10am this morning. Do you have the ability to pay for your medications: Yes, private insurance   Release of information consent forms completed and submitted to Medical Records by CSW.  Patient to Follow up at: Follow-up Information    Follow up with [facility] On 08/28/2014.   Why:  You have been accepted for admission on this date at approximately 3:30-4:00PM. You will discharge from the hospital at 10:00AM in order to be transported directly to this facility by family.    Contact information:   [address] [place], [place] [address] Phone: [phone] Fax: [phone]      Patient denies SI/HI: Yes,  during group/self report.     Safety Planning and Suicide Prevention discussed: Yes,  SPE completed with pt, as he did not consent to family contact. SPI pamphlet provided to pt and he was encouraged to share information with support network, ask questions, and talk about any concerns relating to SPE.  Have you used any form of tobacco in the last 30 days? (Cigarettes, Smokeless Tobacco, Cigars, and/or Pipes): Yes  Has patient been referred to the Quitline?: Yes, faxed on 08/28/14  Smart, Herbert SetaHeather LCSWA 08/28/2014, 8:34 AM

## 2014-08-28 NOTE — Discharge Summary (Signed)
Physician Discharge Summary Note  Patient:  [name] is an 24 y.o., male MRN:  [id number] DOB:  15-May-1991 Patient phone:  [phone] (home)  Patient address:   [address]  [place] [address],  Total Time spent with patient: Greater than 30 minutes  Date of Admission:  08/23/2014 Date of Discharge: 08/28/2014  Reason for Admission:  Per H&P admission:  [name] is a 24 year old Caucasian male. Admitted to [facility] as a walk-in, medically cleared at the [facility] He reports, "I'm trying very hard to get off Heroin. I have been using 2 grams of heroin daily & smoking crack with it x 2 years. Crack use has been heavy x 1 year. Then, every once in a while, I will use Crystal meth. Drugs make me feel happy. When not using, my life feels like it is not worth living. The only time that I feel suicidal is when I'm not using drugs. But, I have not acted on it or had plans to hurt myself. I have lost everything because of my drug use. I'm tired of it. I'm not depressed, but I'm hurting all over. I don't need any medicine for depression. I'm here to get detox, then will [facility] to [facility] [employer] for further treatment after discharge from here".  Principal Problem: Substance induced mood disorder Discharge Diagnoses: Patient Active Problem List   Diagnosis Date Noted  . Substance induced mood disorder [F19.94]   . 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 Polysubstance (including opioids) dependence with physiol dependence [F19.20] 08/23/2014  . Sigmoid diverticulitis s/p lap assisted sigmoid colectomy 07/01/14 [K57.32] 07/01/2014  . Diverticulitis [K57.92] 12/06/2013  . Diverticulitis of large intestine without perforation or abscess without bleeding [K57.32] 12/06/2013  . Obesity, unspecified [E66.9] 12/06/2013  . Acute diverticulitis [K57.92] 12/06/2013  . NAUSEA AND VOMITING [R11.2] 02/09/2009  . OTHER SYMPTOMS INVOLVING DIGESTIVE SYSTEM OTHER [R19.8] 02/09/2009     Musculoskeletal: Strength & Muscle Tone: within normal limits Gait & Station: normal Patient leans: N/A  Psychiatric Specialty Exam:See Suicide Risk Assessment Physical Exam  Constitutional: He is oriented to person, place, and time.  Neck: Normal range of motion.  Respiratory: Effort normal.  Musculoskeletal: Normal range of motion.  Neurological: He is alert and oriented to person, place, and time.    Review of Systems  Psychiatric/Behavioral: Positive for substance abuse. Negative for suicidal ideas, hallucinations and memory loss. Depression: Stable. Nervous/anxious: Stable. Insomnia: Stable.   All other systems reviewed and are negative.   Blood pressure 133/80, pulse 78, temperature 97.3 F (36.3 C), temperature source Oral, resp. rate 16, height  (1.854 m), weight 110.224 kg (243 lb), SpO2 97 %.Body mass index is 32.07 kg/(m^2).    Past Medical History:  Past Medical History  Diagnosis Date  . Diverticulitis   . Attention deficit disorder   . Hiccups     pt states hiccups during sleep   . Anxiety   . Depression     Past Surgical History  Procedure Laterality Date  . Colonscopy       removed polyps   . Laparoscopic partial colectomy N/A 07/01/2014    Procedure: LAPAROSCOPIC ASSISTED SIGMOID COLECTOMY WITH MOBILIZATION OF SPLENIC FLEXURE;  Surgeon: Avel Peace, MD;  Location: [facility];  Service: General;  Laterality: N/A;   Family History: History reviewed. No pertinent family history. Social History:  History  Alcohol Use  . Yes    Comment: two 12 oz cans beer daily  History  Drug Use  . Yes  . Special: "Crack" cocaine, Heroin, Methamphetamines, Marijuana    History   Social History  . Marital Status: Single    Spouse Name: N/A  . Number of Children: N/A  . Years of Education: N/A   Social History Main Topics  . Smoking status: Never Smoker   . Smokeless tobacco: Current User    Types: Chew  . Alcohol Use: Yes     Comment: two 12 oz  cans beer daily  . Drug Use: Yes    Special: "Crack" cocaine, Heroin, Methamphetamines, Marijuana  . Sexual Activity: Yes    Birth Control/ Protection: Condom   Other Topics Concern  . None   Social History Narrative    Risk to Self: Suicidal Ideation: Yes-Currently Present Suicidal Intent: No-Not Currently/Within Last 6 Months Is patient at risk for suicide?: Yes Suicidal Plan?: No-Not Currently/Within Last 6 Months Access to Means: Yes Specify Access to Suicidal Means: access to streets What has been your use of drugs/alcohol within the last 12 months?: Heroin - 2 grams daily for the last 2 years - IV use, crack cocaine - 2 times per week for years, meth - 2 times per month for years How many times?: 0 Other Self Harm Risks: None Triggers for Past Attempts: None known Intentional Self Injurious Behavior: None Risk to Others: Homicidal Ideation: No Thoughts of Harm to Others: No Current Homicidal Intent: No Current Homicidal Plan: No Access to Homicidal Means: No Identified Victim: None History of harm to others?: No Assessment of Violence: None Noted Violent Behavior Description: Pt is calm yet anxious  Does patient have access to weapons?: Yes (Comment) (Patient stated he could get guns off of the street) Criminal Charges Pending?: No Does patient have a court date: Yes (To pay traffic fine) Prior Inpatient Therapy: Prior Inpatient Therapy: No Prior Outpatient Therapy: Prior Outpatient Therapy: No  Level of Care:  RTC  Hospital Course:  [name] was admitted for Substance induced mood disorder and crisis management.  He was treated discharged with the medications listed below under Medication List.  Medical problems were identified and treated as needed.  Home medications were restarted as appropriate.  Improvement was monitored by observation and [name] daily report of symptom reduction.  Emotional and mental status was monitored by daily self-inventory  reports completed by [name] and clinical staff.         [name] was evaluated by the treatment team for stability and plans for continued recovery upon discharge.  [name] motivation was an integral factor for scheduling further treatment.  Employment, transportation, bed availability, health status, family support, and any pending legal issues were also considered during his hospital stay.  He was offered further treatment options upon discharge including but not limited to Residential, Intensive Outpatient, and Outpatient treatment.  Diangelo Radel will follow up with the services as listed below under Follow Up Information.     Upon completion of this admission the patient was both mentally and medically stable for discharge denying suicidal/homicidal ideation, auditory/visual/tactile hallucinations, delusional thoughts and paranoia.      Consults:  psychiatry  Significant Diagnostic Studies:  labs: TSH, T3, CBC, CMET, UDS, ETOH  Discharge Vitals:   Blood pressure 133/80, pulse 78, temperature 97.3 F (36.3 C), temperature source Oral, resp. rate 16, height 6\' 1"  (1.854 m), weight 110.224 kg (243 lb), SpO2 97 %. Body mass index is 32.07 kg/(m^2). Lab Results:   Results for orders placed  or performed during the hospital encounter of 08/23/14 (from the past 72 hour(s))  T3, free     Status: None   Collection Time: 08/25/14  8:03 PM  Result Value Ref Range   T3, Free 2.8 2.0 - 4.4 pg/mL    Comment: (NOTE) Performed At: [facility] [address] [place], [place] [id number] Mila Homer MD [id number] Performed at [facility]   T4, free     Status: None   Collection Time: 08/25/14  8:03 PM  Result Value Ref Range   Free T4 1.23 0.80 - 1.80 ng/dL    Comment: Performed at [facility]    Physical Findings: AIMS: Facial and Oral Movements Muscles of Facial Expression: None, normal Lips and Perioral Area: None,  normal Jaw: None, normal Tongue: None, normal,Extremity Movements Upper (arms, wrists, hands, fingers): None, normal Lower (legs, knees, ankles, toes): None, normal, Trunk Movements Neck, shoulders, hips: None, normal, Overall Severity Severity of abnormal movements (highest score from questions above): None, normal Incapacitation due to abnormal movements: None, normal Patient's awareness of abnormal movements (rate only patient's report): No Awareness, Dental Status Current problems with teeth and/or dentures?: No Does patient usually wear dentures?: No  CIWA:  CIWA-Ar Total: 1 COWS:  COWS Total Score: 3   See Psychiatric Specialty Exam and Suicide Risk Assessment completed by Attending Physician prior to discharge.  Discharge destination:  RTC  Is patient on multiple antipsychotic therapies at discharge:  No   Has Patient had three or more failed trials of antipsychotic monotherapy by history:  No    Recommended Plan for Multiple Antipsychotic Therapies: NA      Discharge Instructions    Activity as tolerated - No restrictions    Complete by:  As directed      Diet general    Complete by:  As directed      Discharge instructions    Complete by:  As directed   Take all of you medications as prescribed by your mental healthcare provider.  Report any adverse effects and reactions from your medications to your outpatient provider promptly. Do not engage in alcohol and or illegal drug use while on prescription medicines. In the event of worsening symptoms call the crisis hotline, 911, and or go to the nearest emergency department for appropriate evaluation and treatment of symptoms. Follow-up with your primary care provider for your medical issues, concerns and or health care needs.   Keep all scheduled appointments.  If you are unable to keep an appointment call to reschedule.  Let the nurse know if you will need medications before next scheduled appointment.             Medication List    TAKE these medications      Indication   cloNIDine 0.1 MG tablet  Commonly known as:  CATAPRES  Take 1 tablet (0.1 mg total) by mouth daily before breakfast. For high blood pressure   Indication:  High Blood Pressure     hydrOXYzine 25 MG tablet  Commonly known as:  ATARAX/VISTARIL  Take 1 tablet (25 mg total) by mouth every 6 (six) hours as needed for anxiety.   Indication:  anxiety     methocarbamol 500 MG tablet  Commonly known as:  ROBAXIN  Take 1 tablet (500 mg total) by mouth every 6 (six) hours as needed for muscle spasms.   Indication:  Musculoskeletal Pain     naproxen 500 MG tablet  Commonly known as:  NAPROSYN  Take 1 tablet (500 mg total) by mouth 2 (two) times daily as needed (aching, pain, or discomfort).   Indication:  Mild to Moderate Pain     traZODone 100 MG tablet  Commonly known as:  DESYREL  Take 1 tablet (100 mg total) by mouth at bedtime as needed for sleep.   Indication:  Trouble Sleeping       Follow-up Information    Follow up with [facility] On 08/28/2014.   Why:  You have been accepted for admission on this date at approximately 3:30-4:00PM. You will discharge from the hospital at 10:00AM in order to be transported directly to this facility by family.    Contact information:   [address] [place], [place] [address] Phone: [phone] Fax: [phone]      Follow-up recommendations:  Activity:  As tolerated Diet:  As tolerated  Comments:   Patient has been instructed to take medications as prescribed; and report adverse effects to outpatient provider.  Follow up with primary doctor for any medical issues and If symptoms recur report to nearest emergency or crisis hot line.    Total Discharge Time: Greater than 30 minutes  Signed: Assunta FoundRankin, Shuvon, FNP-BC 08/28/2014, 1:07 PM  I personally assessed the patient and formulated the plan Madie RenoIrving A. Dub MikesLugo, M.D.

## 2014-09-01 NOTE — Progress Notes (Signed)
Patient Discharge Instructions:  After Visit Summary (AVS):   Faxed to:  09/01/14 Discharge Summary Note:   Faxed to:  09/01/14 Psychiatric Admission Assessment Note:   Faxed to:  09/01/14 Suicide Risk Assessment - Discharge Assessment:   Faxed to:  09/01/14 Faxed/Sent to the Next Level Care provider:  09/01/14 Faxed to [facility] @ [phone]  Jerelene ReddenSheena E , 09/01/2014, 3:23 PM

## 2015-06-28 ENCOUNTER — Encounter (HOSPITAL_COMMUNITY): Payer: Self-pay | Admitting: Emergency Medicine

## 2015-06-28 DIAGNOSIS — S3991XA Unspecified injury of abdomen, initial encounter: Secondary | ICD-10-CM | POA: Insufficient documentation

## 2015-06-28 DIAGNOSIS — Y998 Other external cause status: Secondary | ICD-10-CM | POA: Insufficient documentation

## 2015-06-28 DIAGNOSIS — Y9389 Activity, other specified: Secondary | ICD-10-CM | POA: Insufficient documentation

## 2015-06-28 DIAGNOSIS — Z8719 Personal history of other diseases of the digestive system: Secondary | ICD-10-CM | POA: Insufficient documentation

## 2015-06-28 DIAGNOSIS — S29001A Unspecified injury of muscle and tendon of front wall of thorax, initial encounter: Secondary | ICD-10-CM | POA: Insufficient documentation

## 2015-06-28 DIAGNOSIS — Z8659 Personal history of other mental and behavioral disorders: Secondary | ICD-10-CM | POA: Insufficient documentation

## 2015-06-28 DIAGNOSIS — Y9289 Other specified places as the place of occurrence of the external cause: Secondary | ICD-10-CM | POA: Insufficient documentation

## 2015-06-28 LAB — COMPREHENSIVE METABOLIC PANEL
ALBUMIN: 4 g/dL (ref 3.5–5.0)
ALK PHOS: 59 U/L (ref 38–126)
ALT: 25 U/L (ref 17–63)
AST: 23 U/L (ref 15–41)
Anion gap: 14 (ref 5–15)
BILIRUBIN TOTAL: 0.4 mg/dL (ref 0.3–1.2)
BUN: 8 mg/dL (ref 6–20)
CALCIUM: 9.7 mg/dL (ref 8.9–10.3)
CO2: 25 mmol/L (ref 22–32)
Chloride: 104 mmol/L (ref 101–111)
Creatinine, Ser: 0.94 mg/dL (ref 0.61–1.24)
GFR calc Af Amer: >60 mL/min (ref >60)
GFR calc non Af Amer: >60 mL/min (ref >60)
GLUCOSE: 112 mg/dL — AB (ref 65–99)
Potassium: 3.6 mmol/L (ref 3.5–5.1)
Sodium: 143 mmol/L (ref 135–145)
TOTAL PROTEIN: 7 g/dL (ref 6.5–8.1)

## 2015-06-28 LAB — CBC
HCT: 42.6 % (ref 39.0–52.0)
Hemoglobin: 13.9 g/dL (ref 13.0–17.0)
MCH: 27.3 pg (ref 26.0–34.0)
MCHC: 32.6 g/dL (ref 30.0–36.0)
MCV: 83.5 fL (ref 78.0–100.0)
Platelets: 219 10*3/uL (ref 150–400)
RBC: 5.1 MIL/uL (ref 4.22–5.81)
RDW: 13.5 % (ref 11.5–15.5)
WBC: 5.5 10*3/uL (ref 4.0–10.5)

## 2015-06-28 LAB — TYPE AND SCREEN
ABO/RH(D): A POS
ANTIBODY SCREEN: NEGATIVE
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
## 2015-06-29 ENCOUNTER — Emergency Department (HOSPITAL_COMMUNITY): Payer: Self-pay

## 2015-06-29 ENCOUNTER — Emergency Department (HOSPITAL_COMMUNITY)
Admission: EM | Admit: 2015-06-29 | Discharge: 2015-06-29 | Disposition: A | Discharge status: Home / Self Care | Payer: Self-pay | Attending: Emergency Medicine | Admitting: Emergency Medicine

## 2015-06-29 ENCOUNTER — Encounter (HOSPITAL_COMMUNITY): Payer: Self-pay | Admitting: Radiology

## 2015-06-29 DIAGNOSIS — R1030 Lower abdominal pain, unspecified: Secondary | ICD-10-CM

## 2015-06-29 DIAGNOSIS — K921 Melena: Secondary | ICD-10-CM

## 2015-06-29 DIAGNOSIS — S21102A Unspecified open wound of left front wall of thorax without penetration into thoracic cavity, initial encounter: Secondary | ICD-10-CM

## 2015-06-29 LAB — ABO/RH: ABO/RH(D): A POS

## 2015-06-29 LAB — POC OCCULT BLOOD, ED: Fecal Occult Bld: NEGATIVE

## 2015-06-29 MED ORDER — IOHEXOL 300 MG/ML  SOLN
100.0000 mL | Freq: Once | INTRAMUSCULAR | Status: AC | PRN
  Administered 2015-06-29: 100 mL via INTRAVENOUS

## 2015-06-29 MED ORDER — KETOROLAC TROMETHAMINE 30 MG/ML IJ SOLN
30.0000 mg | Freq: Once | INTRAMUSCULAR | Status: AC
  Administered 2015-06-29: 30 mg via INTRAVENOUS
  Filled 2015-06-29: qty 1

## 2015-06-29 NOTE — ED Notes (Signed)
Patient transported to CT 
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
## 2015-11-30 ENCOUNTER — Encounter (HOSPITAL_COMMUNITY): Payer: Self-pay

## 2015-11-30 ENCOUNTER — Inpatient Hospital Stay (HOSPITAL_COMMUNITY)
Admission: RE | Admit: 2015-11-30 | Discharge: 2015-12-07 | DRG: 897 | Disposition: A | Discharge status: Home / Self Care | Payer: Federal, State, Local not specified - Other | Attending: Psychiatry | Admitting: Psychiatry

## 2015-11-30 DIAGNOSIS — F1124 Opioid dependence with opioid-induced mood disorder: Principal | ICD-10-CM | POA: Diagnosis present

## 2015-11-30 DIAGNOSIS — F1994 Other psychoactive substance use, unspecified with psychoactive substance-induced mood disorder: Secondary | ICD-10-CM | POA: Diagnosis present

## 2015-11-30 DIAGNOSIS — B1920 Unspecified viral hepatitis C without hepatic coma: Secondary | ICD-10-CM | POA: Diagnosis present

## 2015-11-30 DIAGNOSIS — F39 Unspecified mood [affective] disorder: Secondary | ICD-10-CM | POA: Diagnosis present

## 2015-11-30 DIAGNOSIS — F1111 Opioid abuse, in remission: Secondary | ICD-10-CM | POA: Diagnosis present

## 2015-11-30 DIAGNOSIS — Z818 Family history of other mental and behavioral disorders: Secondary | ICD-10-CM | POA: Diagnosis not present

## 2015-11-30 DIAGNOSIS — F1920 Other psychoactive substance dependence, uncomplicated: Secondary | ICD-10-CM | POA: Diagnosis not present

## 2015-11-30 MED ORDER — CLONIDINE HCL 0.1 MG PO TABS
0.1000 mg | ORAL_TABLET | ORAL | Status: AC
  Administered 2015-12-03 – 2015-12-05 (×4): 0.1 mg via ORAL
  Filled 2015-11-30 (×4): qty 1

## 2015-11-30 MED ORDER — NICOTINE 14 MG/24HR TD PT24
14.0000 mg | MEDICATED_PATCH | Freq: Every day | TRANSDERMAL | Status: DC
  Administered 2015-12-01 – 2015-12-03 (×3): 14 mg via TRANSDERMAL
  Filled 2015-11-30 (×5): qty 1

## 2015-11-30 MED ORDER — LOPERAMIDE HCL 2 MG PO CAPS
2.0000 mg | ORAL_CAPSULE | ORAL | Status: AC | PRN
  Administered 2015-12-03: 4 mg via ORAL
  Filled 2015-11-30: qty 2

## 2015-11-30 MED ORDER — QUETIAPINE FUMARATE 50 MG PO TABS
50.0000 mg | ORAL_TABLET | Freq: Every evening | ORAL | Status: DC | PRN
  Administered 2015-11-30 (×2): 50 mg via ORAL
  Filled 2015-11-30 (×8): qty 1

## 2015-11-30 MED ORDER — METHOCARBAMOL 500 MG PO TABS
500.0000 mg | ORAL_TABLET | Freq: Three times a day (TID) | ORAL | Status: AC | PRN
  Administered 2015-12-01 – 2015-12-05 (×7): 500 mg via ORAL
  Filled 2015-11-30 (×7): qty 1

## 2015-11-30 MED ORDER — HYDROXYZINE HCL 25 MG PO TABS
25.0000 mg | ORAL_TABLET | Freq: Four times a day (QID) | ORAL | Status: DC | PRN
  Administered 2015-12-01 – 2015-12-02 (×3): 25 mg via ORAL
  Filled 2015-11-30 (×3): qty 1

## 2015-11-30 MED ORDER — ONDANSETRON 4 MG PO TBDP
4.0000 mg | ORAL_TABLET | Freq: Four times a day (QID) | ORAL | Status: AC | PRN
  Administered 2015-12-03: 4 mg via ORAL
  Filled 2015-11-30: qty 1

## 2015-11-30 MED ORDER — ALUM & MAG HYDROXIDE-SIMETH 200-200-20 MG/5ML PO SUSP: 30.0000 mL | ORAL | Status: DC | PRN

## 2015-11-30 MED ORDER — NAPROXEN 500 MG PO TABS
500.0000 mg | ORAL_TABLET | Freq: Two times a day (BID) | ORAL | Status: AC | PRN
  Administered 2015-12-01 – 2015-12-05 (×8): 500 mg via ORAL
  Filled 2015-11-30 (×8): qty 1

## 2015-11-30 MED ORDER — DICYCLOMINE HCL 20 MG PO TABS
20.0000 mg | ORAL_TABLET | Freq: Four times a day (QID) | ORAL | Status: AC | PRN
  Administered 2015-12-01: 20 mg via ORAL
  Filled 2015-11-30: qty 1

## 2015-11-30 MED ORDER — ARIPIPRAZOLE 2 MG PO TABS
2.0000 mg | ORAL_TABLET | Freq: Every day | ORAL | Status: DC
  Administered 2015-12-01: 2 mg via ORAL
  Filled 2015-11-30 (×3): qty 1

## 2015-11-30 MED ORDER — CLONIDINE HCL 0.1 MG PO TABS
0.1000 mg | ORAL_TABLET | Freq: Four times a day (QID) | ORAL | Status: AC
  Administered 2015-11-30 – 2015-12-03 (×10): 0.1 mg via ORAL
  Filled 2015-11-30 (×11): qty 1

## 2015-11-30 MED ORDER — MAGNESIUM HYDROXIDE 400 MG/5ML PO SUSP: 30.0000 mL | Freq: Every day | ORAL | Status: DC | PRN

## 2015-11-30 MED ORDER — ACETAMINOPHEN 325 MG PO TABS
650.0000 mg | ORAL_TABLET | Freq: Four times a day (QID) | ORAL | Status: DC | PRN
  Administered 2015-12-01 – 2015-12-04 (×5): 650 mg via ORAL
  Filled 2015-11-30 (×5): qty 2

## 2015-11-30 MED ORDER — CLONIDINE HCL 0.1 MG PO TABS
0.1000 mg | ORAL_TABLET | Freq: Every day | ORAL | Status: AC
Start: 2015-12-06 — End: 2015-12-07
  Administered 2015-12-06 – 2015-12-07 (×2): 0.1 mg via ORAL
  Filled 2015-11-30 (×2): qty 1

## 2015-11-30 NOTE — BH Assessment (Signed)
Tele Assessment Note   [name] is an 25 y.o. single male who presents to [facility] accompanied by his parents, who did not participate in assessment. Pt reports he is engaging in heavy drug use, is unable to stop and is having suicidal ideation with thoughts of wrecking his car or shooting himself with a gun. Pt reports he has access to a gun at his home. Pt report he has been using substances regularly since age 65. He is currently using intravenous heroin, intravenous cocaine, methamphetamine and alcohol (see below for details of use). Pt reports he cannot stop using and experiences withdrawal symptoms including sweats, body aches, insomnia and agitation. He denies history of seizures. Pt reports he feels "useless", "worhtless" and "I don't feel like a human being. Pt reports symptoms including crying spells, social withdrawal, loss of interest in usual pleasures, fatigue, irritability, decreased concentration, decreased sleep and feelings of guilt and hopelessness. Pt denies any history of suicide attempts. He denies any history of intentional self-injurious behavior. Pt denies homicidal ideation or history of violence. He denies any history of psychotic symptoms.  Pt identifies consequences of his substance use and need to acquire drugs daily as his primary stressor. He states he is seeking treatment at this time because his life has become unmanageable and he recognizes he cannot quit without treatment. Pt reports he lives with his parents due to his substance use. 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 Pt received inpatient dual-diagnosis treatment at [facility] 08/23/14-14/14/16 and was diagnosed with a mood disorder and substance use disorders. He then went to [employer]. Pt denies current outpatient providers.  Pt is is casually dressed, alert, oriented x4 with normal speech and normal motor behavior. Eye contact is good. Pt's mood is  anxious, depressed ad guilty; affect is congruent with mood. Thought process is coherent and relevant. There is no indication Pt is currently responding to internal stimuli or experiencing delusional thought content. Pt was polite and cooperative throughout assessment. He says he needs inpatient treatment, is highly motivated and will follow the recommendations of treatment team.   Diagnosis: Substance-induced Mood Disorder; Opioid Use Disorder, Severe; Cocaine Use Disorder, Severe; Methamphetamine Use Disorder, Severe; Alcohol Use Disorder, Mild  Past Medical History:  Past Medical History  Diagnosis Date  . Diverticulitis   . Attention deficit disorder   . Hiccups     pt states hiccups during sleep   . Anxiety   . Depression     Past Surgical History  Procedure Laterality Date  . Colonscopy       removed polyps   . Laparoscopic partial colectomy N/A 07/01/2014    Procedure: LAPAROSCOPIC ASSISTED SIGMOID COLECTOMY WITH MOBILIZATION OF SPLENIC FLEXURE;  Surgeon: Avel Peace, MD;  Location: [facility];  Service: General;  Laterality: N/A;    Family History: No family history on file.  Social History:  reports that he has never smoked. His smokeless tobacco use includes Chew. He reports that he drinks alcohol. He reports that he does not use illicit drugs.  Additional Social History:  Alcohol / Drug Use Pain Medications: Denies abuse Prescriptions: None Over the Counter: Denies abuse History of alcohol / drug use?: Yes Negative Consequences of Use: Financial, Personal relationships, Work / School Withdrawal Symptoms: Fever / Chills, Irritability, Agitation, Tingling, Sweats, Cramps Substance #1 Name of Substance 1: Heroin (IV) 1 - Age of First Use: 20 1 - Amount (size/oz): 2 grams 1 -  Frequency: Daily 1 - Duration: Four years 1 - Last Use / Amount: 11/30/15, 1.5 grams Substance #2 Name of Substance 2: Cocaine (IV) 2 - Age of First Use: 20 2 - Amount (size/oz): 0.5-1 gram 2  - Frequency: 2-3 times per week 2 - Duration: Four years 2 - Last Use / Amount: 11/28/15, 1 gram Substance #3 Name of Substance 3: Meth 3 - Age of First Use: 22 3 - Amount (size/oz): Approximately $40 worth 3 - Frequency: 3-4 time per month 3 - Duration: One year 3 - Last Use / Amount: 11/28/15, $40 worth Substance #4 Name of Substance 4: Alcohol 4 - Age of First Use: 15 4 - Amount (size/oz): Six cans of beer 4 - Frequency: 1-2 times per month 4 - Duration: Ongoing 4 - Last Use / Amount: 11/28/15, two cans of beer  CIWA:   COWS:    PATIENT STRENGTHS: (choose at least two) Ability for insight Average or above average intelligence Capable of independent living Communication skills Financial means General fund of knowledge Motivation for treatment/growth Physical Health Supportive family/friends Work skills  Allergies:  Allergies  Allergen Reactions  . Cephalexin Hives    Home Medications:  Medications Prior to Admission  Medication Sig Dispense Refill  . cloNIDine (CATAPRES) 0.1 MG tablet Take 1 tablet (0.1 mg total) by mouth daily before breakfast. For high blood pressure (Patient not taking: Reported on 06/29/2015) 30 tablet 0  . hydrOXYzine (ATARAX/VISTARIL) 25 MG tablet Take 1 tablet (25 mg total) by mouth every 6 (six) hours as needed for anxiety. (Patient not taking: Reported on 06/29/2015) 60 tablet 0  . methocarbamol (ROBAXIN) 500 MG tablet Take 1 tablet (500 mg total) by mouth every 6 (six) hours as needed for muscle spasms. (Patient not taking: Reported on 06/29/2015) 45 tablet 0  . naproxen (NAPROSYN) 500 MG tablet Take 1 tablet (500 mg total) by mouth 2 (two) times daily as needed (aching, pain, or discomfort). (Patient not taking: Reported on 06/29/2015) 60 tablet 0  . traZODone (DESYREL) 100 MG tablet Take 1 tablet (100 mg total) by mouth at bedtime as needed for sleep. (Patient not taking: Reported on 06/29/2015) 30 tablet 0    OB/GYN Status:  No LMP for male  patient.  General Assessment Data Location of Assessment: [facility] Assessment Services TTS Assessment: In system Is this a Tele or Face-to-Face Assessment?: Face-to-Face Is this an Initial Assessment or a Re-assessment for this encounter?: Initial Assessment Marital status: Single Maiden name: NA Is patient pregnant?: No Pregnancy Status: No Living Arrangements: Parent (Lives with parents) Can pt return to current living arrangement?: Yes Admission Status: Voluntary Is patient capable of signing voluntary admission?: Yes Referral Source: Self/Family/Friend Insurance type: Self-pay  Medical Screening Exam [facility] Walk-in ONLY) Medical Exam completed: No Reason for MSE not completed: Other: (Pt admitted directlty to adult unit)  Crisis Care Plan Living Arrangements: Parent (Lives with parents) Legal Guardian: Other: (Self) Name of Psychiatrist: None Name of Therapist: None  Education Status Is patient currently in school?: No Current Grade: NA Highest grade of school patient has completed: Two semesters of college Name of school: NA Contact person: NA  Risk to self with the past 6 months Suicidal Ideation: Yes-Currently Present Has patient been a risk to self within the past 6 months prior to admission? : Yes Suicidal Intent: Yes-Currently Present Has patient had any suicidal intent within the past 6 months prior to admission? : Yes Is patient at risk for suicide?: Yes Suicidal Plan?: Yes-Currently  Present Has patient had any suicidal plan within the past 6 months prior to admission? : Yes Specify Current Suicidal Plan: Shoot himself, wreck car Access to Means: Yes Specify Access to Suicidal Means: Pt has access to gun and car What has been your use of drugs/alcohol within the last 12 months?: Pt using heroin, cocaine, meth and alcohol Previous Attempts/Gestures: No How many times?: 0 Other Self Harm Risks: None identified Triggers for Past Attempts: None known Intentional  Self Injurious Behavior: None Family Suicide History: No Recent stressful life event(s): Financial Problems Persecutory voices/beliefs?: No Depression: Yes Depression Symptoms: Despondent, Tearfulness, Isolating, Fatigue, Guilt, Loss of interest in usual pleasures, Feeling worthless/self pity, Feeling angry/irritable Substance abuse history and/or treatment for substance abuse?: Yes Suicide prevention information given to non-admitted patients: Not applicable  Risk to Others within the past 6 months Homicidal Ideation: No Does patient have any lifetime risk of violence toward others beyond the six months prior to admission? : No Thoughts of Harm to Others: No Current Homicidal Intent: No Current Homicidal Plan: No Access to Homicidal Means: No Identified Victim: None History of harm to others?: No Assessment of Violence: None Noted Violent Behavior Description: Pt denies history of violence Does patient have access to weapons?: Yes (Comment) (Pt reports access to gun) Criminal Charges Pending?: No Does patient have a court date: No Is patient on probation?: No  Psychosis Hallucinations: None noted Delusions: None noted  Mental Status Report Appearance/Hygiene: Other (Comment) (Casually dressed) Eye Contact: Good Motor Activity: Unremarkable Speech: Logical/coherent Level of Consciousness: Alert Mood: Depressed, Anxious, Guilty, Ashamed/humiliated Affect: Anxious, Appropriate to circumstance Anxiety Level: Moderate Thought Processes: Coherent, Relevant Judgement: Unimpaired Orientation: Person, Place, Time, Situation, Appropriate for developmental age Obsessive Compulsive Thoughts/Behaviors: None  Cognitive Functioning Concentration: Normal Memory: Recent Intact, Remote Intact IQ: Average Insight: Fair Impulse Control: Fair Appetite: Fair Weight Loss: 0 Weight Gain: 0 Sleep: No Change Total Hours of Sleep: 7 Vegetative Symptoms: None  ADLScreening [facility]  Ass[facility]) Patient's cognitive ability adequate to safely complete daily activities?: Yes Patient able to express need for assistance with ADLs?: Yes Independently performs ADLs?: Yes (appropriate for developmental age)  Prior Inpatient Therapy Prior Inpatient Therapy: Yes Prior Therapy Dates: 08/2014 [facility] Reason for Treatment: Substance induced mood disorder, opioid dependence  Prior Outpatient Therapy Prior Outpatient Therapy: No Prior Therapy Dates: NA Prior Therapy Facilty/Provider(s): NA Reason for Treatment: NA Does patient have an ACCT team?: No Does patient have Intensive In-House Services?  : No Does patient have [facility] services? : No Does patient have P4CC services?: No  ADL Screening (condition at time of admission) Patient's cognitive ability adequate to safely complete daily activities?: Yes Is the patient deaf or have difficulty hearing?: No Does the patient have difficulty seeing, even when wearing glasses/contacts?: No Does the patient have difficulty concentrating, remembering, or making decisions?: No Patient able to express need for assistance with ADLs?: Yes Does the patient have difficulty dressing or bathing?: No Independently performs ADLs?: Yes (appropriate for developmental age) Does the patient have difficulty walking or climbing stairs?: No Weakness of Legs: None Weakness of Arms/Hands: None  Home Assistive Devices/Equipment Home Assistive Devices/Equipment: None    Abuse/Neglect Assessment (Assessment to be complete while patient is alone) Physical Abuse: Denies Verbal Abuse: Denies Sexual Abuse: Denies Exploitation of patient/patient's resources: Denies Self-Neglect: Denies     Merchant navy officerAdvance Directives (For Healthcare) Does patient have an advance directive?: No Would patient like information on creating an advanced directive?: No -  patient declined information    Additional Information 1:1 In  Past 12 Months?: No CIRT Risk: No Elopement Risk: No Does patient have medical clearance?: No     Disposition: Clint Bolder, AC at [facility], confirmed bed availability. Gave clinical report to Dr. Nelly Rout who said Pt meets criteria for dual-diagnosis treatment and accepted Pt to the service of Dr. Carmon Ginsberg. [place], room 303-2. Dr. Lucianne Muss said Donell Sievert, PA will medically evaluate the Pt and Pt does not require medical clearance through ED.   Disposition Initial Assessment Completed for this Encounter: Yes Disposition of Patient: Inpatient treatment program Type of inpatient treatment program: Adult   Pamalee Leyden, [facility], [facility], [facility] Triage Specialist [phone]   Pamalee Leyden 11/30/2015 8:07 PM

## 2015-11-30 NOTE — H&P (Signed)
Psychiatric Admission Assessment Adult  Patient Identification: [name] MRN:  [id number] Date of Evaluation:  11/30/2015 Chief Complaint:  Substance Induced Mood Disorder Principal Diagnosis: <principal problem not specified> Diagnosis:   Patient Active Problem List   Diagnosis Date Noted  . Substance induced mood disorder (HCC) [F19.94]   . Atypical depression (HCC) [F32.89]   . Polysubstance (including opioids) dependence with physiol dependence (HCC) [F19.20] 08/23/2014  . Sigmoid diverticulitis s/p lap assisted sigmoid colectomy 07/01/14 [K57.32] 07/01/2014  . Diverticulitis [K57.92] 12/06/2013  . Diverticulitis of large intestine without perforation or abscess without bleeding [K57.32] 12/06/2013  . Obesity, unspecified [E66.9] 12/06/2013  . Acute diverticulitis [K57.92] 12/06/2013  . NAUSEA AND VOMITING [R11.2] 02/09/2009  . OTHER SYMPTOMS INVOLVING DIGESTIVE SYSTEM OTHER [R19.8] 02/09/2009   History of Present Illness: Patient is a 25 y/o WM, direct admit as a walk in per the verbal orders of Nelly RoutArchana Kumar MD.  Mr [name] has a hx of polysubstance induced mood disorder, with primary drugs being Heroin, Cocaine and Crack Cocaine. His last used heroin at 3 pm today, using 2 gm/daily over the last year. He is wanting to come off the drugs and get his life together. He is denying depression or anxiety symptoms. He also drinks alcohol socially ( 6 pack/week) and dips tobacco. He is denying previous or pending legal charges. He is single and without children and currently unemployed. He went to [facility] at [employer]. He endorses a paternal hx of substance abuse and states his father has a hx of MDD. Patient is endorsing a Pmhx  Hepatitis C, Diverticulitis/partial colectomy. [facility]. Associated Signs/Symptoms: Depression Symptoms:  depressed mood, feelings of worthlessness/guilt, hopelessness, (Hypo) Manic Symptoms:  denies Anxiety Symptoms:  Excessive  Worry, Psychotic Symptoms:  denies PTSD Symptoms: Negative Total Time spent with patient: 30 minutes  Past Psychiatric History: 1) ADD                                                 2) Substance Induced Mood Disorder  Is the patient at risk to self? No.  Has the patient been a risk to self in the past 6 months? No.  Has the patient been a risk to self within the distant past? No.  Is the patient a risk to others? No.  Has the patient been a risk to others in the past 6 months? No.  Has the patient been a risk to others within the distant past? No.   Prior Inpatient Therapy: Prior Inpatient Therapy: Yes Prior Therapy Dates: 08/2014 [facility] Reason for Treatment: Substance induced mood disorder, opioid dependence Prior Outpatient Therapy: Prior Outpatient Therapy: No Prior Therapy Dates: NA Prior Therapy Facilty/Provider(s): NA Reason for Treatment: NA Does patient have an ACCT team?: No Does patient have Intensive In-House Services?  : No Does patient have [facility] services? : No Does patient have P4CC services?: No  Alcohol Screening:   Substance Abuse History in the last 12 months:  Yes.   Consequences of Substance Abuse: Withdrawal Symptoms:   Headaches Tremors Previous Psychotropic Medications: Yes  Psychological Evaluations: Yes  Past Medical History:  Past Medical History  Diagnosis Date  . Diverticulitis   . Attention deficit disorder   . Hiccups     pt states hiccups during sleep   . Anxiety   .  Depression     Past Surgical History  Procedure Laterality Date  . Colonscopy       removed polyps   . Laparoscopic partial colectomy N/A 07/01/2014    Procedure: LAPAROSCOPIC ASSISTED SIGMOID COLECTOMY WITH MOBILIZATION OF SPLENIC FLEXURE;  Surgeon: Avel Peace, MD;  Location: [facility];  Service: General;  Laterality: N/A;   Family History: History reviewed. No pertinent family history. Family Psychiatric  History: Substance  abuse, MDD Tobacco Screening: Dips Tobacco Social History:  History  Alcohol Use  . Yes    Comment: occasionally     History  Drug Use No    Comment: clean for 9 months--     Additional Social History: Marital status: Single    Pain Medications: Denies abuse Prescriptions: None Over the Counter: Denies abuse History of alcohol / drug use?: Yes Negative Consequences of Use: Financial, Personal relationships, Work / Programmer, multimedia Withdrawal Symptoms: Fever / Chills, Irritability, Agitation, Tingling, Sweats, Cramps Name of Substance 1: Heroin (IV) 1 - Age of First Use: 20 1 - Amount (size/oz): 2 grams 1 - Frequency: Daily 1 - Duration: Four years 1 - Last Use / Amount: 11/30/15, 1.5 grams Name of Substance 2: Cocaine (IV) 2 - Age of First Use: 20 2 - Amount (size/oz): 0.5-1 gram 2 - Frequency: 2-3 times per week 2 - Duration: Four years 2 - Last Use / Amount: 11/28/15, 1 gram Name of Substance 3: Meth 3 - Age of First Use: 22 3 - Amount (size/oz): Approximately $40 worth 3 - Frequency: 3-4 time per month 3 - Duration: One year 3 - Last Use / Amount: 11/28/15, $40 worth Name of Substance 4: Alcohol 4 - Age of First Use: 15 4 - Amount (size/oz): Six cans of beer 4 - Frequency: 1-2 times per month 4 - Duration: Ongoing 4 - Last Use / Amount: 11/28/15, two cans of beer            Allergies:   Allergies  Allergen Reactions  . Cephalexin Hives   Lab Results: No results found for this or any previous visit (from the past 48 hour(s)).  Blood Alcohol level:  Lab Results  Component Value Date   ETH <5 08/23/2014    Metabolic Disorder Labs:  No results found for: HGBA1C, MPG No results found for: PROLACTIN No results found for: CHOL, TRIG, HDL, CHOLHDL, VLDL, LDLCALC  Current Medications: Current Facility-Administered Medications  Medication Dose Route Frequency Provider Last Rate Last Dose  . acetaminophen (TYLENOL) tablet 650 mg  650 mg Oral Q6H PRN Kerry Hough, PA-C      . alum & mag hydroxide-simeth (MAALOX/MYLANTA) 200-200-20 MG/5ML suspension 30 mL  30 mL Oral Q4H PRN Kerry Hough, PA-C      . [START ON 12/01/2015] ARIPiprazole (ABILIFY) tablet 2 mg  2 mg Oral Daily Spencer E Simon, PA-C      . cloNIDine (CATAPRES) tablet 0.1 mg  0.1 mg Oral QID Kerry Hough, PA-C       Followed by  . [START ON 12/03/2015] cloNIDine (CATAPRES) tablet 0.1 mg  0.1 mg Oral BH-qamhs Spencer E Simon, PA-C       Followed by  . [START ON 12/06/2015] cloNIDine (CATAPRES) tablet 0.1 mg  0.1 mg Oral QAC breakfast Kerry Hough, PA-C      . dicyclomine (BENTYL) tablet 20 mg  20 mg Oral Q6H PRN Kerry Hough, PA-C      . hydrOXYzine (ATARAX/VISTARIL) tablet 25 mg  25 mg  Oral Q6H PRN Kerry Hough, PA-C      . loperamide (IMODIUM) capsule 2-4 mg  2-4 mg Oral PRN Kerry Hough, PA-C      . magnesium hydroxide (MILK OF MAGNESIA) suspension 30 mL  30 mL Oral Daily PRN Kerry Hough, PA-C      . methocarbamol (ROBAXIN) tablet 500 mg  500 mg Oral Q8H PRN Kerry Hough, PA-C      . naproxen (NAPROSYN) tablet 500 mg  500 mg Oral BID PRN Kerry Hough, PA-C      . [START ON 12/01/2015] nicotine (NICODERM CQ - dosed in mg/24 hours) patch 14 mg  14 mg Transdermal Q0600 Kerry Hough, PA-C      . ondansetron (ZOFRAN-ODT) disintegrating tablet 4 mg  4 mg Oral Q6H PRN Kerry Hough, PA-C      . QUEtiapine (SEROQUEL) tablet 50 mg  50 mg Oral QHS,MR X 1 Spencer E Simon, PA-C       PTA Medications: Prescriptions prior to admission  Medication Sig Dispense Refill Last Dose  . cloNIDine (CATAPRES) 0.1 MG tablet Take 1 tablet (0.1 mg total) by mouth daily before breakfast. For high blood pressure (Patient not taking: Reported on 06/29/2015) 30 tablet 0 Unknown at Unknown time  . hydrOXYzine (ATARAX/VISTARIL) 25 MG tablet Take 1 tablet (25 mg total) by mouth every 6 (six) hours as needed for anxiety. (Patient not taking: Reported on 06/29/2015) 60 tablet 0 Unknown at  Unknown time  . methocarbamol (ROBAXIN) 500 MG tablet Take 1 tablet (500 mg total) by mouth every 6 (six) hours as needed for muscle spasms. (Patient not taking: Reported on 06/29/2015) 45 tablet 0 Unknown at Unknown time  . naproxen (NAPROSYN) 500 MG tablet Take 1 tablet (500 mg total) by mouth 2 (two) times daily as needed (aching, pain, or discomfort). (Patient not taking: Reported on 06/29/2015) 60 tablet 0 Unknown at Unknown time  . traZODone (DESYREL) 100 MG tablet Take 1 tablet (100 mg total) by mouth at bedtime as needed for sleep. (Patient not taking: Reported on 06/29/2015) 30 tablet 0 Unknown at Unknown time    Musculoskeletal: Strength & Muscle Tone: within normal limits Gait & Station: normal Patient leans: N/A  Psychiatric Specialty Exam: Physical Exam  Nursing note and vitals reviewed. Constitutional: He is oriented to person, place, and time. Vital signs are normal. He appears well-developed and well-nourished.  Non-toxic appearance. He does not have a sickly appearance. No distress.  HENT:  Head: Normocephalic and atraumatic.  Eyes: Conjunctivae and EOM are normal. Pupils are equal, round, and reactive to light.  Neck: Normal range of motion.  Cardiovascular: Normal rate and regular rhythm.   Pulses:      Radial pulses are 2+ on the right side, and 2+ on the left side.  GI: Normal appearance.  Musculoskeletal: Normal range of motion. He exhibits no edema.  Neurological: He is alert and oriented to person, place, and time. He has normal strength. No cranial nerve deficit or sensory deficit. He displays a negative Romberg sign.  Skin: Skin is warm and dry.  Psychiatric: His speech is normal and behavior is normal. Judgment and thought content normal. His mood appears anxious. Cognition and memory are normal.    Review of Systems  Constitutional: Negative for fever, chills, malaise/fatigue and diaphoresis.  Neurological: Negative for weakness.  Psychiatric/Behavioral:  Positive for substance abuse. Negative for depression, suicidal ideas and hallucinations. The patient is nervous/anxious and has insomnia.  All other systems reviewed and are negative.   Blood pressure 134/78, pulse 91, temperature 98.7 F (37.1 C), temperature source Oral, height 6' 0.75" (1.848 m), weight 115.667 kg (255 lb).Body mass index is 33.87 kg/(m^2).  General Appearance: Disheveled  Eye Contact:  Good  Speech:  Clear and Coherent  Volume:  Normal  Mood:  Anxious  Affect:  Congruent  Thought Process:  Goal Directed  Orientation:  Full (Time, Place, and Person)  Thought Content:  Negative  Suicidal Thoughts:  No  Homicidal Thoughts:  No  Memory:  Immediate;   Good  Judgement:  Impaired  Insight:  Lacking  Psychomotor Activity:  Normal  Concentration:  Concentration: Fair  Recall:  [employer] of Knowledge:  Fair  Language:  Good  Akathisia:  Negative  Handed:  Right  AIMS (if indicated):     Assets:  Desire for Improvement  ADL's:  Intact  Cognition:  WNL  Sleep:          Treatment Plan Summary: Plan Admit to [address] due to Substance Induced Mood Disorder. W/d protocol in place, Continue to monitor for safety and stabilization  Observation Level/Precautions:  15 minute checks  Laboratory:  CBC,BMP,HgbA1c, Lipid, TSH, U/A , Urine Drug Screen  Psychotherapy:    Medications:    Consultations:    Discharge Concerns:    Estimated LOS:  Other:     I certify that inpatient services furnished can reasonably be expected to improve the patient's condition.    Kerry Hough, PA-C 7/17/20179:24 PM Agree with PA note as above

## 2015-12-01 ENCOUNTER — Encounter (HOSPITAL_COMMUNITY): Payer: Self-pay | Admitting: Psychiatry

## 2015-12-01 DIAGNOSIS — F1994 Other psychoactive substance use, unspecified with psychoactive substance-induced mood disorder: Secondary | ICD-10-CM

## 2015-12-01 LAB — HEPATIC FUNCTION PANEL
ALBUMIN: 4.3 g/dL (ref 3.5–5.0)
ALK PHOS: 69 U/L (ref 38–126)
ALT: 18 U/L (ref 17–63)
AST: 16 U/L (ref 15–41)
BILIRUBIN DIRECT: 0.1 mg/dL (ref 0.1–0.5)
Indirect Bilirubin: 0.9 mg/dL (ref 0.3–0.9)
TOTAL PROTEIN: 7.2 g/dL (ref 6.5–8.1)
Total Bilirubin: 1 mg/dL (ref 0.3–1.2)

## 2015-12-01 LAB — URINALYSIS, ROUTINE W REFLEX MICROSCOPIC
Bilirubin Urine: NEGATIVE
Glucose, UA: NEGATIVE mg/dL
Hgb urine dipstick: NEGATIVE
KETONES UR: NEGATIVE mg/dL
LEUKOCYTES UA: NEGATIVE
NITRITE: NEGATIVE
PROTEIN: NEGATIVE mg/dL
Specific Gravity, Urine: 1.029 (ref 1.005–1.030)
pH: 5.5 (ref 5.0–8.0)

## 2015-12-01 LAB — LIPID PANEL
Cholesterol: 131 mg/dL (ref 0–200)
HDL: 47 mg/dL (ref >40)
LDL Cholesterol: 60 mg/dL (ref 0–99)
Total CHOL/HDL Ratio: 2.8 RATIO
Triglycerides: 118 mg/dL (ref <150)
VLDL: 24 mg/dL (ref 0–40)

## 2015-12-01 LAB — CBC
HEMATOCRIT: 40.8 % (ref 39.0–52.0)
Hemoglobin: 13.5 g/dL (ref 13.0–17.0)
MCH: 27.8 pg (ref 26.0–34.0)
MCHC: 33.1 g/dL (ref 30.0–36.0)
MCV: 84 fL (ref 78.0–100.0)
PLATELETS: 206 10*3/uL (ref 150–400)
RBC: 4.86 MIL/uL (ref 4.22–5.81)
RDW: 13.3 % (ref 11.5–15.5)
WBC: 5.1 10*3/uL (ref 4.0–10.5)

## 2015-12-01 LAB — RAPID URINE DRUG SCREEN, HOSP PERFORMED
Amphetamines: NOT DETECTED
Barbiturates: NOT DETECTED
Benzodiazepines: NOT DETECTED
Cocaine: POSITIVE — AB
Opiates: POSITIVE — AB
Tetrahydrocannabinol: NOT DETECTED

## 2015-12-01 LAB — TSH: TSH: 1.434 u[IU]/mL (ref 0.350–4.500)

## 2015-12-01 MED ORDER — QUETIAPINE FUMARATE 50 MG PO TABS
50.0000 mg | ORAL_TABLET | Freq: Once | ORAL | Status: AC
Start: 2015-12-01 — End: 2015-12-01
  Administered 2015-12-01: 50 mg via ORAL
  Filled 2015-12-01 (×2): qty 1

## 2015-12-01 MED ORDER — QUETIAPINE FUMARATE 50 MG PO TABS
50.0000 mg | ORAL_TABLET | Freq: Every day | ORAL | Status: DC
  Administered 2015-12-01: 50 mg via ORAL
  Filled 2015-12-01 (×3): qty 1

## 2015-12-01 MED ORDER — CITALOPRAM HYDROBROMIDE 10 MG PO TABS
10.0000 mg | ORAL_TABLET | Freq: Every day | ORAL | Status: DC
  Administered 2015-12-01 – 2015-12-04 (×4): 10 mg via ORAL
  Filled 2015-12-01 (×6): qty 1

## 2015-12-01 NOTE — [facility] Suicide Risk Assessment (Signed)
[facility] INPATIENT:  Family/Significant Other Suicide Prevention Education  Suicide Prevention Education:  Patient Refusal for Family/Significant Other Suicide Prevention Education: The patient [name] has refused to provide written consent for family/significant other to be provided Family/Significant Other Suicide Prevention Education during admission and/or prior to discharge.  Physician notified.  SPE completed with pt, as pt refused to consent to family contact. SPI pamphlet provided to pt and pt was encouraged to share information with support network, ask questions, and talk about any concerns relating to SPE. Pt denies access to guns/firearms and verbalized understanding of information provided. Mobile Crisis information also provided to pt.   Smart, Lamija Besse LCSW 12/01/2015, 4:15 PM

## 2015-12-01 NOTE — Progress Notes (Signed)
Patient rated his day a 3. He stated he is going through detox and goal is to get through sickness and get better.

## 2015-12-01 NOTE — [facility] Suicide Risk Assessment (Addendum)
[facility]   Nursing information obtained from:  Patient Demographic factors:  Male, Adolescent or young adult, Caucasian, Access to firearms Current Mental Status:  NA Loss Factors:  Decline in physical health Historical Factors:  Family history of mental illness or substance abuse Risk Reduction Factors:  Employed, Living with another person, especially a relative, Positive social support  Total Time spent with patient: 45 minutes Principal Problem:  Opiate Dependence, Cocaine Dependence , Substance Induced Mood Disorder  Diagnosis:   Patient Active Problem List   Diagnosis Date Noted  . Substance induced mood disorder (HCC) [F19.94]   . Atypical depression (HCC) [F32.89]   . Polysubstance (including opioids) dependence with physiol dependence (HCC) [F19.20] 08/23/2014  . Sigmoid diverticulitis s/p lap assisted sigmoid colectomy 07/01/14 [K57.32] 07/01/2014  . Diverticulitis [K57.92] 12/06/2013  . Diverticulitis of large intestine without perforation or abscess without bleeding [K57.32] 12/06/2013  . Obesity, unspecified [E66.9] 12/06/2013  . Acute diverticulitis [K57.92] 12/06/2013  . NAUSEA AND VOMITING [R11.2] 02/09/2009  . OTHER SYMPTOMS INVOLVING DIGESTIVE SYSTEM OTHER [R19.8] 02/09/2009     Continued Clinical Symptoms:  Alcohol Use Disorder Identification Test Final Score (AUDIT): 3 The "Alcohol Use Disorders Identification Test", Guidelines for Use in Primary Care, Second Edition.  World Science writerHealth Organization [facility](WHO). Score between 0-7:  no or low risk or alcohol related problems. Score between 8-15:  moderate risk of alcohol related problems. Score between 16-19:  high risk of alcohol related problems. Score 20 or above:  warrants further diagnostic evaluation for alcohol dependence and treatment.   CLINICAL FACTORS:  25 year old single male, no children, currently living with his parents, was working as a Musicianlocksmith.  He has a long history of  substance dependence. His drug of choice is heroin at this time, but also uses cocaine regularly and sometimes methamphetamine. He denies alcohol or benzodiazepine abuse . He presented to the ED voluntarily due to worsening depression in the context of drug dependence, with suicidal thoughts of crashing car or shooting self .  Today remains depressed, anxious, but is not actively suicidal and is able to contract for safety on the unit. He is currently experiencing symptoms of opiate withdrawal, and appears vaguely uncomfortable.  No vomiting at this time, vitals stable . Patient reports he has a history of depression, and thinks that he is depressed even when sober, although mood worsens further when using drugs. Denies history of suicide attempts or of violence, denies history of psychosis . Dx- Opiate Dependence,Cocaine Dependence , substance induced mood disorder versus MDD  Plan- Opiate detox protocol,agrees to start antidepressant for depression and anxiety, start Celexa 10 mgrs QDAY initially .    Musculoskeletal: Strength & Muscle Tone: within normal limits- no tremors , no diaphoresis, no psychomotor agitation  Gait & Station: normal Patient leans: N/A  Psychiatric Specialty Exam: Physical Exam  ROS malaise, diffuse aches, nausea, diarrhea , no vomiting, no fever   Blood pressure 120/74, pulse 92, temperature 98.4 F (36.9 C), temperature source Oral, resp. rate 18, height 6' 0.75" (1.848 m), weight 255 lb (115.667 kg).Body mass index is 33.87 kg/(m^2).  General Appearance: Fairly Groomed  Eye Contact:  Good  Speech:  Normal Rate  Volume:  Normal  Mood:  Anxious and Depressed  Affect:  Constricted  Thought Process:  Linear  Orientation:  Full (Time, Place, and Person)  Thought Content:  no hallucinations, no delusions, not internally preoccupied   Suicidal Thoughts:  No at this time denies suicidal plan or  intention and contracts for safety   Homicidal Thoughts:  No denies any  homicidal ideations   Memory:  recent and remote grossly intact   Judgement:  Fair  Insight:  Present  Psychomotor Activity:  Normal  Concentration:  Concentration: Good and Attention Span: Good  Recall:  Good  Fund of Knowledge:  Good  Language:  Good  Akathisia:  Negative  Handed:  Right  AIMS (if indicated):     Assets:  Communication Skills Desire for Improvement Resilience  ADL's:  Intact  Cognition:  WNL  Sleep:  Number of Hours: 5.75      COGNITIVE FEATURES THAT CONTRIBUTE TO RISK:  Closed-mindedness and Loss of executive function    SUICIDE RISK:   Moderate:  Frequent suicidal ideation with limited intensity, and duration, some specificity in terms of plans, no associated intent, good self-control, limited dysphoria/symptomatology, some risk factors present, and identifiable protective factors, including available and accessible social support.  PLAN OF CARE: Patient will be admitted to inpatient psychiatric unit for stabilization and safety. Will provide and encourage milieu participation. Provide medication management and maked adjustments as needed. Will also provide medication management to minimize risk of withdrawal .  Will follow daily.    I certify that inpatient services furnished can reasonably be expected to improve the patient's condition.   Nehemiah Massed, MD 12/01/2015, 4:52 PM

## 2015-12-01 NOTE — Tx Team (Signed)
Interdisciplinary Treatment Plan Update (Adult)  Date:  12/01/2015  Time Reviewed:  8:48 AM   Progress in Treatment: Attending groups: No. New to unit. Continuing to assess.  Participating in groups:  No. Taking medication as prescribed:  Yes. Tolerating medication:  Yes. Family/Significant othe contact made:  SPE required for this pt.  Patient understands diagnosis:  Yes. and As evidenced by:  seeking treatment for heroin abuse, cocaine abuse, methamphetamine abuse, alcohol abuse, depression, SI, and for medication stabilization. Discussing patient identified problems/goals with staff:  Yes. Medical problems stabilized or resolved:  Yes. Denies suicidal/homicidal ideation: Yes. Issues/concerns per patient self-inventory:  Other:   Discharge Plan or Barriers: CSW assessing for appropriate referrals. Pt last admitted 08/23/14 and was referred directly to [facility] program.   Reason for Continuation of Hospitalization: Depression Medication stabilization Withdrawal symptoms  Comments:  [name] is an 25 y.o. single male who presents to [facility] accompanied by his parents, who did not participate in assessment. Pt reports he is engaging in heavy drug use, is unable to stop and is having suicidal ideation with thoughts of wrecking his car or shooting himself with a gun. Pt reports he has access to a gun at his home. Pt report he has been using substances regularly since age 45. He is currently using IV heroin, intravenous cocaine, methamphetamine and alcohol. Pt denies any history of suicide attempts. He denies any history of intentional self-injurious behavior. Pt denies homicidal ideation or history of violence. He denies any history of psychotic symptoms.Pt identifies consequences of his substance use and need to acquire drugs daily as his primary stressor. He states he is seeking treatment at this time because his life has become unmanageable and he recognizes  he cannot quit without treatment. Pt reports he lives with his parents due to his substance use. 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 Pt received inpatient dual-diagnosis treatment at [facility] 08/23/14-14/14/16 and was diagnosed with a mood disorder and substance use disorders. He then went to [facility]. Pt denies current outpatient providers.Diagnosis: Substance-induced Mood Disorder; Opioid Use Disorder, Severe; Cocaine Use Disorder, Severe; Methamphetamine Use Disorder, Severe; Alcohol Use Disorder, Mild  Estimated length of stay:  3-5 days   New goal(s): to develop effective aftercare plan.   Additional Comments:  Patient and CSW reviewed pt's identified goals and treatment plan. Patient verbalized understanding and agreed to treatment plan. CSW reviewed [facility] "Discharge Process and Patient Involvement" Form. Pt verbalized understanding of information provided and signed form.    Review of initial/current patient goals per problem list:  1. Goal(s): Patient will participate in aftercare plan  Met: No.   Target date: at discharge  As evidenced by: Patient will participate within aftercare plan AEB aftercare provider and housing plan at discharge being identified.  7/18: CSW assessing for appropriate referrals.   2. Goal (s): Patient will exhibit decreased depressive symptoms and suicidal ideations.  Met: No.    Target date: at discharge  As evidenced by: Patient will utilize self rating of depression at 3 or below and demonstrate decreased signs of depression or be deemed stable for discharge by MD.  7/18: Pt rates depression as high. Passive SI at times. No plan/intent. Pt able to contract for safety on the unit.   3. Goal(s): Patient will demonstrate decreased signs of withdrawal due to substance abuse  Met:No.   Target date:at discharge   As evidenced by: Patient  will produce a CIWA/COWS score of 0, have stable  vitals signs, and no symptoms of withdrawal.  7/18: Pt reports mild withdrawals with COWS of 3 and low standing/sitting BP.   Attendees: Patient:   12/01/2015 8:48 AM   Family:   12/01/2015 8:48 AM   Physician:  Dr. Parke Poisson MD 12/01/2015 8:48 AM   Nursing:   Nevada Crane RN 12/01/2015 8:48 AM   Clinical Social Worker: Maxie Better, LCSW 12/01/2015 8:48 AM   Clinical Social Worker: Erasmo Downer Drinkard LCSW; Peri Maris LCSWA 12/01/2015 8:48 AM   Other:  Gerline Legacy Nurse Case Manager 12/01/2015 8:48 AM   Other:  Agustina Caroli NP 12/01/2015 8:48 AM   Other:   12/01/2015 8:48 AM   Other:  12/01/2015 8:48 AM   Other:  12/01/2015 8:48 AM   Other:  12/01/2015 8:48 AM    12/01/2015 8:48 AM    12/01/2015 8:48 AM    12/01/2015 8:48 AM    12/01/2015 8:48 AM    Scribe for Treatment Team:   Maxie Better, LCSW 12/01/2015 8:48 AM

## 2015-12-01 NOTE — Progress Notes (Signed)
D:  Patient's self inventory sheet, patient has good sleep, sleep medication is helpful.  Fair appetite, low energy level, poor concentration.  Rated depression 3, hopeless and anxiety #7.  Withdrawals, cravings, cramping, agitation, irritability.  Denied SI.  Physical problems, headaches, pain.  Worst pain in past 24 hours is #9, whole body.  No pain medication.  Goal is to get through withdrawals.  Plans to move around.   No discharge plans. A:  Medications administered per MD orders.  Emotional support and encouragement given patient. R:  Denied SI and HI, contracts for safety.  Denied A/V hallucinations.  Safety maintained with 15 minute checks. Patient has been in bed most of the day, has been going to dining room for meals. Patient stated he does want seroquel tonight for sleep, will discuss medications with NP/MD.

## 2015-12-01 NOTE — Progress Notes (Signed)
Recreation Therapy Notes  Animal-Assisted Activity (AAA) Program Checklist/Progress Notes Patient Eligibility Criteria Checklist & Daily Group note for Rec TxIntervention  Date: 07.18.2017 Time: 2:45pm Location: [address] Dayroom    AAA/T Program Assumption of Risk Form signed by Patient/ or Parent Legal Guardian Yes  Patient is free of allergies or sever asthma Yes  Patient reports no fear of animals Yes  Patient reports no history of cruelty to animals Yes  Patient understands his/her participation is voluntary Yes  Behavioral Response: Did not attend.    Tionne Dayhoff L Orlan Aversa, LRT/CTRS        Antwaine Boomhower L 12/01/2015 3:11 PM 

## 2015-12-01 NOTE — Tx Team (Signed)
Initial Interdisciplinary Treatment Plan   PATIENT STRESSORS: Substance abuse   PATIENT STRENGTHS: Ability for insight Average or above average intelligence Capable of independent living General fund of knowledge Motivation for treatment/growth Supportive family/friends   PROBLEM LIST: Problem List/Patient Goals Date to be addressed Date deferred Reason deferred Estimated date of resolution  Substance abuse 11/30/2015     Risk for suicide 11/30/2015     insonmia 11/30/2015     depression 11/30/2015     anxiety 11/30/2015     "getting mind correct" 11/30/2015     "getting motivated" 11/30/2015     "get off drugs" 11/30/2015            DISCHARGE CRITERIA:  Ability to meet basic life and health needs Adequate post-discharge living arrangements Improved stabilization in mood, thinking, and/or behavior Motivation to continue treatment in a less acute level of care Need for constant or close observation no longer present Withdrawal symptoms are absent or subacute and managed without 24-hour nursing intervention  PRELIMINARY DISCHARGE PLAN: Attend aftercare/continuing care group Attend PHP/IOP Attend 12-step recovery group Return to previous living arrangement Return to previous work or school arrangements  PATIENT/FAMIILY INVOLVEMENT: This treatment plan has been presented to and reviewed with the patient, [name], The patient and family have been given the opportunity to ask questions and make suggestions.  JEHU-APPIAH, Kharis Lapenna K 12/01/2015, 3:32 AM

## 2015-12-01 NOTE — H&P (Signed)
Psychiatric Admission Assessment Adult  Patient Identification: [name]  MRN:  [id number]  Date of Evaluation:  12/01/2015  Chief Complaint: Opioid withdrawal symptoms.  Principal Diagnosis: Polysubstance (including opioids) dependence with physiol dependence (HCC)  Diagnosis:   Patient Active Problem List   Diagnosis Date Noted  . Substance induced mood disorder (HCC) [F19.94]   . Atypical depression (HCC) [F32.89]   . Polysubstance (including opioids) dependence with physiol dependence (HCC) [F19.20] 08/23/2014  . Sigmoid diverticulitis s/p lap assisted sigmoid colectomy 07/01/14 [K57.32] 07/01/2014  . Diverticulitis [K57.92] 12/06/2013  . Diverticulitis of large intestine without perforation or abscess without bleeding [K57.32] 12/06/2013  . Obesity, unspecified [E66.9] 12/06/2013  . Acute diverticulitis [K57.92] 12/06/2013  . NAUSEA AND VOMITING [R11.2] 02/09/2009  . OTHER SYMPTOMS INVOLVING DIGESTIVE SYSTEM OTHER [R19.8] 02/09/2009   History of Present Illness: This is an admission assessment for [name] a 25 year old Caucasian male. Admitted to the [facility] adult unit with as a walk-in with complaints of heavy drug use (Heroin & Cocaine). However, he states that his heroin use has escalated that he does not feel like living any more. He expressed the desire to end it all by either wrecking his car on purpose with him in it or shoot himself as he can access a gun very easily.  During this assessment, Gurvir reports, "I have been using drugs heavily on daily basis for 6 years, non-stop. It has worsened tremendously because that is all I do on daily basis as I have no job or anything meaningful to do with life.  I use primarily Heroin intravenously. I also use Crack Cocaine. I used last, yesterday around 3:00 pm. I use about 2 gram daily for 2 years. I need to stop, but I need a lot of help & support to do it. I cannot say I'm depressed, but I'm very unhappy for what my life has become. I  have let everyone that cared about me down. I also drink alcohol, just socially about 6 packs of beer on weekly basis. I dip tobacco. I contracted Hepatitis C from IV drug use. I suffer from diverticulitis & have had part of my colon removed. My longest sobriety is only 4 weeks & that was a while ago. I will need to go into an inpatient substance abuse treatment center after discharge from here".   Associated Signs/Symptoms:  Depression Symptoms:  depressed mood, insomnia, psychomotor agitation, feelings of worthlessness/guilt, hopelessness, anxiety,  (Hypo) Manic Symptoms:  Impulsivity, Irritable Mood,  Anxiety Symptoms:  Excessive Worry,  Psychotic Symptoms:  Denies any hallucintaions, delusional thoughts or paranoia.  PTSD Symptoms: Denies any PTSD events or symptoms.  Total Time spent with patient: 1 hour  Past Psychiatric History: 1) ADD, Polysubstance dependence.                                                 2) Substance Induced Mood Disorder.  Is the patient at risk to self? No.  Has the patient been a risk to self in the past 6 months? No.  Has the patient been a risk to self within the distant past? No.  Is the patient a risk to others? No.  Has the patient been a risk to others in the past 6 months? No.  Has the patient been a risk to others within the distant past? No.  Prior Inpatient Therapy: Prior Inpatient Therapy: Yes Prior Therapy Dates: 08/2014 Prior Therapy Facilty/Provider(s): [facility] Reason for Treatment: Substance induced mood disorder, opioid dependence  Prior Outpatient Therapy: Prior Outpatient Therapy: No Prior Therapy Dates: NA Prior Therapy Facilty/Provider(s): NA Reason for Treatment: NA Does patient have an ACCT team?: No Does patient have Intensive In-House Services?  : No Does patient have [facility] services? : No Does patient have P4CC services?: No  Alcohol Screening: 1. How often do you have a drink containing alcohol?: Monthly or  less 2. How many drinks containing alcohol do you have on a typical day when you are drinking?: 3 or 4 3. How often do you have six or more drinks on one occasion?: Less than monthly Preliminary Score: 2 4. How often during the last year have you found that you were not able to stop drinking once you had started?: Never 5. How often during the last year have you failed to do what was normally expected from you becasue of drinking?: Never 6. How often during the last year have you needed a first drink in the morning to get yourself going after a heavy drinking session?: Never 7. How often during the last year have you had a feeling of guilt of remorse after drinking?: Never 8. How often during the last year have you been unable to remember what happened the night before because you had been drinking?: Never 9. Have you or someone else been injured as a result of your drinking?: No 10. Has a relative or friend or a doctor or another health worker been concerned about your drinking or suggested you cut down?: No Alcohol Use Disorder Identification Test Final Score (AUDIT): 3 Brief Intervention: AUDIT score less than 7 or less-screening does not suggest unhealthy drinking-brief intervention not indicated  Substance Abuse History in the last 12 months:  Yes.    Consequences of Substance Abuse: Medical Consequences:  Liver damage, Possible death by overdose Legal Consequences:  Arrests, jail time, Loss of driving privilege. Family Consequences:  Family discord, divorce and or separation.  Previous Psychotropic Medications: Yes   Psychological Evaluations: Yes   Past Medical History:  Past Medical History  Diagnosis Date  . Diverticulitis   . Attention deficit disorder   . Hiccups     pt states hiccups during sleep   . Anxiety   . Depression     Past Surgical History  Procedure Laterality Date  . Colonscopy       removed polyps   . Laparoscopic partial colectomy N/A 07/01/2014     Procedure: LAPAROSCOPIC ASSISTED SIGMOID COLECTOMY WITH MOBILIZATION OF SPLENIC FLEXURE;  Surgeon: Avel Peace, MD;  Location: [facility];  Service: General;  Laterality: N/A;    Family History: History reviewed. No pertinent family history.  Family Psychiatric  History: Opioid dependence, Cocaine abuse, Major depression  Tobacco Screening: Dips Tobacco  Social History:  History  Alcohol Use  . Yes    Comment: occasionally     History  Drug Use No    Comment: clean for 9 months--     Additional Social History: Marital status: Single    Pain Medications: Denies abuse Prescriptions: None Over the Counter: Denies abuse History of alcohol / drug use?: Yes Negative Consequences of Use: Financial, Personal relationships, Work / Programmer, multimedia Withdrawal Symptoms: Fever / Chills, Irritability, Agitation, Tingling, Sweats, Cramps Name of Substance 1: Heroin (IV) 1 - Age of First Use: 20 1 - Amount (size/oz): 2 grams 1 - Frequency: Daily  1 - Duration: Four years 1 - Last Use / Amount: 11/30/15, 1.5 grams Name of Substance 2: Cocaine (IV) 2 - Age of First Use: 20 2 - Amount (size/oz): 0.5-1 gram 2 - Frequency: 2-3 times per week 2 - Duration: Four years 2 - Last Use / Amount: 11/28/15, 1 gram Name of Substance 3: Meth 3 - Age of First Use: 22 3 - Amount (size/oz): Approximately $40 worth 3 - Frequency: 3-4 time per month 3 - Duration: One year 3 - Last Use / Amount: 11/28/15, $40 worth Name of Substance 4: Alcohol 4 - Age of First Use: 15 4 - Amount (size/oz): Six cans of beer 4 - Frequency: 1-2 times per month 4 - Duration: Ongoing 4 - Last Use / Amount: 11/28/15, two cans of beer  Allergies:   Allergies  Allergen Reactions  . Cephalexin Hives    Throat closes up   Lab Results:  Results for orders placed or performed during the hospital encounter of 11/30/15 (from the past 48 hour(s))  Urine rapid drug screen (hosp performed)not at [facility]     Status: Abnormal   Collection  Time: 12/01/15  5:00 AM  Result Value Ref Range   Opiates POSITIVE (A) NONE DETECTED   Cocaine POSITIVE (A) NONE DETECTED   Benzodiazepines NONE DETECTED NONE DETECTED   Amphetamines NONE DETECTED NONE DETECTED   Tetrahydrocannabinol NONE DETECTED NONE DETECTED   Barbiturates NONE DETECTED NONE DETECTED    Comment:        DRUG SCREEN FOR MEDICAL PURPOSES ONLY.  IF CONFIRMATION IS NEEDED FOR ANY PURPOSE, NOTIFY LAB WITHIN 5 DAYS.        LOWEST DETECTABLE LIMITS FOR URINE DRUG SCREEN Drug Class       Cutoff (ng/mL) Amphetamine      1000 Barbiturate      200 Benzodiazepine   200 Tricyclics       300 Opiates          300 Cocaine          300 THC              50 Performed at [facility]   Urinalysis, Routine w reflex microscopic (not at [facility])     Status: None   Collection Time: 12/01/15  5:00 AM  Result Value Ref Range   Color, Urine YELLOW YELLOW   APPearance CLEAR CLEAR   Specific Gravity, Urine 1.029 1.005 - 1.030   pH 5.5 5.0 - 8.0   Glucose, UA NEGATIVE NEGATIVE mg/dL   Hgb urine dipstick NEGATIVE NEGATIVE   Bilirubin Urine NEGATIVE NEGATIVE   Ketones, ur NEGATIVE NEGATIVE mg/dL   Protein, ur NEGATIVE NEGATIVE mg/dL   Nitrite NEGATIVE NEGATIVE   Leukocytes, UA NEGATIVE NEGATIVE    Comment: MICROSCOPIC NOT DONE ON URINES WITH NEGATIVE PROTEIN, BLOOD, LEUKOCYTES, NITRITE, OR GLUCOSE <1000 mg/dL. Performed at [facility]   CBC     Status: None   Collection Time: 12/01/15  6:10 AM  Result Value Ref Range   WBC 5.1 4.0 - 10.5 K/uL   RBC 4.86 4.22 - 5.81 MIL/uL   Hemoglobin 13.5 13.0 - 17.0 g/dL   HCT 13.040.8 86.539.0 - 78.452.0 %   MCV 84.0 78.0 - 100.0 fL   MCH 27.8 26.0 - 34.0 pg   MCHC 33.1 30.0 - 36.0 g/dL   RDW 69.613.3 29.511.5 - 28.415.5 %   Platelets 206 150 - 400 K/uL    Comment: Performed at [facility]  Lipid panel, fasting     Status: None   Collection Time: 12/01/15  6:10 AM  Result Value Ref Range   Cholesterol 131  0 - 200 mg/dL   Triglycerides 409 <811 mg/dL   HDL 47 >91 mg/dL   Total CHOL/HDL Ratio 2.8 RATIO   VLDL 24 0 - 40 mg/dL   LDL Cholesterol 60 0 - 99 mg/dL    Comment:        Total Cholesterol/HDL:CHD Risk Coronary Heart Disease Risk Table                     Men   Women  1/2 Average Risk   3.4   3.3  Average Risk       5.0   4.4  2 X Average Risk   9.6   7.1  3 X Average Risk  23.4   11.0        Use the calculated Patient Ratio above and the CHD Risk Table to determine the patient's CHD Risk.        ATP III CLASSIFICATION (LDL):  <100     mg/dL   Optimal  478-295  mg/dL   Near or Above                    Optimal  130-159  mg/dL   Borderline  621-308  mg/dL   High  >657     mg/dL   Very High Performed at [facility]   Hepatic function panel     Status: None   Collection Time: 12/01/15  6:10 AM  Result Value Ref Range   Total Protein 7.2 6.5 - 8.1 g/dL   Albumin 4.3 3.5 - 5.0 g/dL   AST 16 15 - 41 U/L   ALT 18 17 - 63 U/L   Alkaline Phosphatase 69 38 - 126 U/L   Total Bilirubin 1.0 0.3 - 1.2 mg/dL   Bilirubin, Direct 0.1 0.1 - 0.5 mg/dL   Indirect Bilirubin 0.9 0.3 - 0.9 mg/dL    Comment: Performed at [facility]  TSH     Status: None   Collection Time: 12/01/15  6:10 AM  Result Value Ref Range   TSH 1.434 0.350 - 4.500 uIU/mL    Comment: Performed at [facility]   Blood Alcohol level:  Lab Results  Component Value Date   [facility] <5 08/23/2014   Metabolic Disorder Labs:  No results found for: HGBA1C, MPG No results found for: PROLACTIN Lab Results  Component Value Date   CHOL 131 12/01/2015   TRIG 118 12/01/2015   HDL 47 12/01/2015   CHOLHDL 2.8 12/01/2015   VLDL 24 12/01/2015   LDLCALC 60 12/01/2015   Current Medications: Current Facility-Administered Medications  Medication Dose Route Frequency Provider Last Rate Last Dose  . acetaminophen (TYLENOL) tablet 650 mg  650 mg Oral Q6H PRN Kerry Hough, PA-C       . alum & mag hydroxide-simeth (MAALOX/MYLANTA) 200-200-20 MG/5ML suspension 30 mL  30 mL Oral Q4H PRN Kerry Hough, PA-C      . ARIPiprazole (ABILIFY) tablet 2 mg  2 mg Oral Daily Kerry Hough, PA-C   2 mg at 12/01/15 8469  . cloNIDine (CATAPRES) tablet 0.1 mg  0.1 mg Oral QID Kerry Hough, PA-C   0.1 mg at 12/01/15 6295   Followed by  . [START ON 12/03/2015] cloNIDine (CATAPRES) tablet 0.1 mg  0.1 mg Oral BH-qamhs  Kerry Hough, PA-C       Followed by  . [START ON 12/06/2015] cloNIDine (CATAPRES) tablet 0.1 mg  0.1 mg Oral QAC breakfast Kerry Hough, PA-C      . dicyclomine (BENTYL) tablet 20 mg  20 mg Oral Q6H PRN Kerry Hough, PA-C   20 mg at 12/01/15 0837  . hydrOXYzine (ATARAX/VISTARIL) tablet 25 mg  25 mg Oral Q6H PRN Kerry Hough, PA-C   25 mg at 12/01/15 1610  . loperamide (IMODIUM) capsule 2-4 mg  2-4 mg Oral PRN Kerry Hough, PA-C      . magnesium hydroxide (MILK OF MAGNESIA) suspension 30 mL  30 mL Oral Daily PRN Kerry Hough, PA-C      . methocarbamol (ROBAXIN) tablet 500 mg  500 mg Oral Q8H PRN Kerry Hough, PA-C   500 mg at 12/01/15 0641  . naproxen (NAPROSYN) tablet 500 mg  500 mg Oral BID PRN Kerry Hough, PA-C   500 mg at 12/01/15 9604  . nicotine (NICODERM CQ - dosed in mg/24 hours) patch 14 mg  14 mg Transdermal Q0600 Kerry Hough, PA-C   14 mg at 12/01/15 0641  . ondansetron (ZOFRAN-ODT) disintegrating tablet 4 mg  4 mg Oral Q6H PRN Kerry Hough, PA-C      . QUEtiapine (SEROQUEL) tablet 50 mg  50 mg Oral QHS,MR X 1 Kerry Hough, PA-C   50 mg at 11/30/15 2236   PTA Medications: Prescriptions prior to admission  Medication Sig Dispense Refill Last Dose  . cloNIDine (CATAPRES) 0.1 MG tablet Take 1 tablet (0.1 mg total) by mouth daily before breakfast. For high blood pressure (Patient not taking: Reported on 06/29/2015) 30 tablet 0   . hydrOXYzine (ATARAX/VISTARIL) 25 MG tablet Take 1 tablet (25 mg total) by mouth every 6 (six) hours as  needed for anxiety. (Patient not taking: Reported on 06/29/2015) 60 tablet 0 Unknown at Unknown time  . methocarbamol (ROBAXIN) 500 MG tablet Take 1 tablet (500 mg total) by mouth every 6 (six) hours as needed for muscle spasms. (Patient not taking: Reported on 06/29/2015) 45 tablet 0 Unknown at Unknown time  . naproxen (NAPROSYN) 500 MG tablet Take 1 tablet (500 mg total) by mouth 2 (two) times daily as needed (aching, pain, or discomfort). (Patient not taking: Reported on 06/29/2015) 60 tablet 0 Unknown at Unknown time  . traZODone (DESYREL) 100 MG tablet Take 1 tablet (100 mg total) by mouth at bedtime as needed for sleep. (Patient not taking: Reported on 06/29/2015) 30 tablet 0 Unknown at Unknown time   Musculoskeletal: Strength & Muscle Tone: within normal limits Gait & Station: normal Patient leans: N/A  Psychiatric Specialty Exam: Physical Exam  Nursing note and vitals reviewed. Constitutional: He is oriented to person, place, and time. Vital signs are normal. He appears well-developed and well-nourished.  Non-toxic appearance. He does not have a sickly appearance. No distress.  HENT:  Head: Normocephalic.  Eyes: Pupils are equal, round, and reactive to light.  Neck: Normal range of motion.  Cardiovascular: Normal rate and regular rhythm.   Pulses:      Radial pulses are 2+ on the right side, and 2+ on the left side.  Respiratory: Effort normal.  GI: Soft. Normal appearance.  Genitourinary:  Denies any issues in this area  Musculoskeletal: Normal range of motion. He exhibits no edema.  Neurological: He is alert and oriented to person, place, and time.  Skin: Skin is warm and  dry.  Psychiatric: His speech is normal and behavior is normal. Judgment and thought content normal. His mood appears anxious. Cognition and memory are normal.    Review of Systems  Constitutional: Positive for chills, malaise/fatigue and diaphoresis. Negative for fever.  Eyes: Negative.   Respiratory:  Negative.   Cardiovascular: Negative.   Gastrointestinal: Positive for nausea and abdominal pain.  Genitourinary: Negative.   Musculoskeletal: Positive for myalgias and joint pain.  Skin: Negative.   Neurological: Positive for dizziness, tremors, weakness and headaches.  Endo/Heme/Allergies: Negative.   Psychiatric/Behavioral: Positive for depression and substance abuse (Opioid dependence, Cocaine dependence). Negative for suicidal ideas, hallucinations and memory loss. The patient is nervous/anxious and has insomnia.   All other systems reviewed and are negative.   Blood pressure 106/59, pulse 80, temperature 98.4 F (36.9 C), temperature source Oral, resp. rate 18, height 6' 0.75" (1.848 m), weight 115.667 kg (255 lb).Body mass index is 33.87 kg/(m^2).  General Appearance: Disheveled, obese,   Eye Contact:  Fair  Speech:  Clear, slow, coherent, non-spontaneous  Volume:  Decreased  Mood:  Anxious and Depressed  Affect:  Restricted  Thought Process:  Coherent and Goal Directed  Orientation:  Full (Time, Place, and Person)  Thought Content:  Denies any hallucinations, delusional thoughts or paranoia  Suicidal Thoughts:  Denies any thoughts, plans or intent  Homicidal Thoughts:  Denies any thoughts, plans or intent  Memory:  Immediate;   Good Recent;   Good Remote;   Good  Judgement:  Other:  Fairly intact  Insight:  Present  Psychomotor Activity:  Restlessness, high anxiety levels, tremors  Concentration:  Concentration: Fair and Attention Span: Fair  Recall:  [employer] of Knowledge:  Limited  Language:  Good  Akathisia:  Negative  Handed:  Right  AIMS (if indicated):     Assets:  Desire for Improvement Resilience  ADL's:  Intact  Cognition:  WNL  Sleep:  Number of Hours: 5.75   Treatment Plan Summary: Daily contact with patient to assess and evaluate symptoms and progress in treatment and Medication management: 1. Admit for crisis management and stabilization, estimated  length of stay 3-5 days.  2. Medication management to reduce current symptoms to base line and improve the patient's overall level of functioning:  Will continue the Clonidine detox protocols for opioid detox, Abilify 2 mg for mood control, discontinue the Seroquel 50 mg, initiate Trazodone 50 mg for insomnia. 3. Treat health problems as indicated.  4. Develop treatment plan to decrease risk of relapse upon discharge and the need for readmission.  5. Psycho-social education regarding relapse prevention and self care.  6. Health care follow up as needed for medical problems.  7. Review, reconcile, and reinstate any pertinent home medications for other health issues where appropriate. 8. Call for consults with hospitalist for any additional specialty patient care services as needed.  Observation Level/Precautions:  15 minute checks  Laboratory: Per ED, UDS positive for Opioid & cocaine  Psychotherapy: Group session  Medications: Will continue the Clonidine detox protocols for opioid detox, Abilify 2 mg for mood control, discontinue the Seroquel 50 mg, initiate Trazodone 50 mg for insomnia.   Consultations: As needed  Discharge Concerns: Maintaining sobriety, mood stability.  Estimated LOS: 3-5 days  Other: Admit to [facility].   I certify that inpatient services furnished can reasonably be expected to improve the patient's condition.    Sanjuana Kava, NP, PMHMP, FNP-BC. 7/18/201711:00 AM Case reviewed with NP and patient seen by me  Agree with NP note and assessment  25 year old single male, no children, currently living with his parents, was working as a Musician.  He has a long history of substance dependence. His drug of choice is heroin at this time, but also uses cocaine regularly and sometimes methamphetamine. He denies alcohol or benzodiazepine abuse . He presented to the ED voluntarily due to worsening depression in the context of drug dependence, with suicidal thoughts of crashing  car or shooting self .  Today remains depressed, anxious, but is not actively suicidal and is able to contract for safety on the unit. He is currently experiencing symptoms of opiate withdrawal, and appears vaguely uncomfortable.  No vomiting at this time, vitals stable . Patient reports he has a history of depression, and thinks that he is depressed even when sober, although mood worsens further when using drugs. Denies history of suicide attempts or of violence, denies history of psychosis . Dx- Opiate Dependence,Cocaine Dependence , substance induced mood disorder versus MDD  Plan- Opiate detox protocol,agrees to start antidepressant for depression and anxiety, start Celexa 10 mgrs QDAY initially .

## 2015-12-01 NOTE — BHH Counselor (Signed)
Adult Comprehensive Assessment  Patient ID: Benetta Sparthan Suit, male   DOB: 03/18/1991, 25 y.o.   MRN: [id number]  Information Source: Information source: Patient  Current Stressors:  Substance abuse: heroin (IV USE up to 2 grams daily for the past year), Intermittent meth and crack cocaine abuse  Living/Environment/Situation:  Living Arrangements: Parents Living conditions (as described by patient or guardian): Pt lives with his parents in [place]. Pt reports parents are supportive but want him to get help for his substance abuse.  How long has patient lived in current situation?: off and on for the past year What is atmosphere in current home: Supportive, Loving, Comfortable  Family History:  Marital status: Single Does patient have children?: No  Childhood History:  By whom was/is the patient raised?: Both parents Additional childhood history information: Pt reports having a good childhood with no significant issues.  Description of patient's relationship with caregiver when they were a child: Pt reports getting along well with parents growing up.  Patient's description of current relationship with people who raised him/her: Pt states parents are still supportive but want him to get help. Does patient have siblings?: Yes Number of Siblings: 1 Description of patient's current relationship with siblings: pt reports being close to brother Did patient suffer any verbal/emotional/physical/sexual abuse as a child?: No Did patient suffer from severe childhood neglect?: No Has patient ever been sexually abused/assaulted/raped as an adolescent or adult?: No Was the patient ever a victim of a crime or a disaster?: No Witnessed domestic violence?: No Has patient been effected by domestic violence as an adult?: No  Education:  Highest grade of school patient has completed: graduated high school Currently a Consulting civil engineerstudent?: No Learning disability?: No  Employment/Work Situation:   Employment situation: unemployed since yesterday 7/17 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 How long has patient been employed?: 3 years Patient's job has been impacted by current illness: Yes Describe how patient's job has been impacted: lost job due to substance abuse What is the longest time patient has a held a job?: current job Where was the patient employed at that time?: 2 years Has patient ever been in the [employer] and Companymilitary?: No Has patient ever served in Buyer, retailcombat?: No  Financial Resources:  Financial resources: Income from employment, Private insurance Does patient have a representative payee or guardian?: No  Alcohol/Substance Abuse:  What has been your use of drugs/alcohol within the last 12 months?: Heroin - 2 grams daily for the last 2 years - IV use, crack cocaine - 2 times per week for years, meth - 2 times per month for years If attempted suicide, did drugs/alcohol play a role in this?: No Alcohol/Substance Abuse Treatment [facility]: [facility]; [facility] in [place]; [facility] in [place].  Has alcohol/substance abuse ever caused legal problems?: No  Social Support System:  Patient's Community Support System: Good Describe Community Support System: parents and a family friend are pt's main support Type of faith/religion: none reported How does patient's faith help to cope with current illness?: N/A  Leisure/Recreation:  Leisure and Hobbies: "getting high"  Strengths/Needs:  What things does the patient do well?: unable to name anything right now In what areas does patient struggle / problems for patient: substance abuse, depression, SI  Discharge Plan:  Does patient have access to transportation?: Yes Will patient be returning to same living situation after discharge?: No Plan for living situation after discharge: [facility] or [facility] referrals to be made by CSW. [facility] for mental health  outpatient treatment.  Currently  receiving community mental health services: No-pt reports that he was off his mental health medications since going to a religiously based treatment center in [place] ([facility]  If no, would patient like referral for services when discharged?: Yes (What county?) ([facility]) Does patient have financial barriers related to discharge medications?: No      Summary/Recommendations:   Summary and Recommendations (to be completed by the evaluator): Patient is 25 year old male living in [place], [place] with his parents. Patient reports heroin abuse daily (IV), increased depression/SI, and requests medication stabilization/detox. Patient currently denies SI/HI/AVH. Patient was last admitted to [facility] 08/2014 for detox. Patient is requesting referral to [facility] and [facility] for further substance abuse treatment. Recommendations for patient include: crisis stabilization, therapeutic milieu, encourage group attendance and participation, medication management for withdrawals/mood stabilization, and development of comprehensive mental wellness/sobriety plan. CSW assessing for appropriate referrals.    Smart, Jessica Seidman LCSW 12/01/2015

## 2015-12-01 NOTE — Plan of Care (Signed)
Problem: Education: Goal: Utilization of techniques to improve thought processes will improve Outcome: Progressing Nurse discussed depression/coping skills with patient.    

## 2015-12-01 NOTE — Progress Notes (Signed)
Patient ID: [name], male   DOB: 11/28/1990, 25 y.o.   MRN: [id number] Admission note: D: Patient is a  Voluntary admission in no acute distress for heroin detox. Pt reports last use was few hours before admission. Pt reports using about 2 grams of IV heroin daily. Pt reports he uses to feel normal. Pt reports first use at age 89 and have been using since. Pt reports  Using other substance if he can get his hands on it. Pt reports history   of  Hep C and partial colectomy. Pt reports goal is to get motivated, stay off drugs and go to a long term treatment.  A: Pt admitted to unit per protocol, skin assessment and belonging search done. 2 iv heroin inj site. left on wrist. rt at anticubital.  Consent signed by pt. Pt educated on therapeutic milieu rules. Pt was introduced to milieu by nursing staff. Fall risk safety plan explained to the patient. 15 minutes checks started for safety.  R: Pt was receptive to education. Writer offered support.

## 2015-12-01 NOTE — BHH Group Notes (Signed)
BHH LCSW Group Therapy  12/01/2015 1:53 PM  Type of Therapy:  Group Therapy  Participation Level:  Did Not Attend-pt invited. Reports withdrawals are bad today "Can I rest in bed?"   Summary of Progress/Problems: MHA Speaker came to talk about his personal journey with substance abuse and addiction.   Smart, Providence Stivers LCSW 12/01/2015, 1:53 PM

## 2015-12-01 NOTE — BHH Group Notes (Signed)
The focus of this group is to educate the patient on the purpose and policies of crisis stabilization and provide a format to answer questions about their admission.  The group details unit policies and expectations of patients while admitted.  Patient did not attend 0900 nurse education group this morning.  Patient stayed in bed.

## 2015-12-02 LAB — HEMOGLOBIN A1C
HEMOGLOBIN A1C: 5 % (ref 4.8–5.6)
Mean Plasma Glucose: 97 mg/dL

## 2015-12-02 MED ORDER — LORAZEPAM 0.5 MG PO TABS
0.5000 mg | ORAL_TABLET | Freq: Four times a day (QID) | ORAL | Status: DC | PRN
  Administered 2015-12-02 – 2015-12-07 (×14): 0.5 mg via ORAL
  Filled 2015-12-02 (×14): qty 1

## 2015-12-02 MED ORDER — QUETIAPINE FUMARATE 25 MG PO TABS
75.0000 mg | ORAL_TABLET | Freq: Every day | ORAL | Status: DC
  Administered 2015-12-02: 75 mg via ORAL
  Filled 2015-12-02 (×4): qty 3

## 2015-12-02 MED ORDER — QUETIAPINE FUMARATE 100 MG PO TABS: 100.0000 mg | ORAL_TABLET | Freq: Every day | ORAL | Status: DC

## 2015-12-02 MED ORDER — QUETIAPINE FUMARATE 50 MG PO TABS
75.0000 mg | ORAL_TABLET | Freq: Once | ORAL | Status: AC
  Administered 2015-12-03: 75 mg via ORAL
  Filled 2015-12-02: qty 1

## 2015-12-02 NOTE — Progress Notes (Signed)
D: Pt was in the dayroom upon initial approach.  He describes his day as "rough" because he is "trying to get through the sickness."  Pt has anxious affect and mood.  Pt denies SI/HI, denies hallucinations, reports generalized pain of 10/10.  Pt has been visible in milieu interacting with peers and staff appropriately.  Pt attended evening group.   A: Introduced self to pt.  Met with pt and offered support and encouragement.  Actively listened to pt.  Medications administered per order.  PRN medication administered for anxiety and pain.  On-site provider notified that pt reports he still could not sleep after scheduled medication administered.  Seroquel 50 mg POX1 ordered and administered.  R: Pt is compliant with medications.  Pt verbally contracts for safety.  Will continue to monitor and assess.

## 2015-12-02 NOTE — Progress Notes (Signed)
[name] rates Anxiety 8/10 and Depression 6/10. His goal today was to "get through detox and try to get some rest tonight". Denies SI/HI/AVH. Encouragement and support given. Medications administered as prescribed. Continue Q 15 minute checks for patient safety and medication effectiveness.

## 2015-12-02 NOTE — [facility] Group Notes (Signed)
[facility] Note   12/02/2015 10:00 AM  Participation Quality:  Invited. DID NOT ATTEND. Chose to rest in bed. Pt reports severe withdrawals today.   Smart, John Pemberton LCSW

## 2015-12-02 NOTE — Progress Notes (Signed)
Recreation Therapy Notes  Date: 07.19.2017 Time: 9:30am Location: [address] Hall Group Room   Group Topic: Stress Management  Goal Area(s) Addresses:  Patient will actively participate in stress management techniques presented during session.   Behavioral Response: Did not attend.   Marykay Lexenise L Donnamarie Shankles, LRT/CTRS        Jearl KlinefelterBlanchfield, Imanie Darrow L 12/02/2015 3:18 PM

## 2015-12-02 NOTE — Progress Notes (Addendum)
[facility] MD Progress Note  12/02/2015 2:33 PM [name]  MRN:  [id number] Subjective:  Patient states he has had a " really rough time " and describes feelings of weakness, diarrhea, and ongoing feelings of " kind like my skin crawling " States he has been experiencing significant cravings in the context of WDL. Denies medication side effects. States he slept poorly last night due to withdrawal symptoms.  Objective : I have discussed case with treatment team and have met with patient . Patient remains anxious, which he attributes to [facility]. States that " I feel like jumping out of my own skin sometimes ". Describes depression, and states last night had some passive thoughts of death, but denies any actual suicidal plan or intention and contracts for safety on the unit . Patient reports that he is " caught up in the life "  ( referring to his substance abuse history) , and reports " I hang out with people who are selling, or using, hanging out with people who can be dangerous". He has been visible on the unit, and has been going to some groups, but states " I've been staying in my room because I am in [facility]" Labs reviewed as below.  Principal Problem: Polysubstance (including opioids) dependence with physiol dependence ([place]) Diagnosis:   Patient Active Problem List   Diagnosis Date Noted  . Substance induced mood disorder ([place]) [F19.94]   . Atypical depression ([place]) [F32.89]   . Polysubstance (including opioids) dependence with physiol dependence ([place]) [F19.20] 08/23/2014  . Sigmoid diverticulitis s/p lap assisted sigmoid colectomy 07/01/14 [K57.32] 07/01/2014  . Diverticulitis [K57.92] 12/06/2013  . Diverticulitis of large intestine without perforation or abscess without bleeding [K57.32] 12/06/2013  . Obesity, unspecified [E66.9] 12/06/2013  . Acute diverticulitis [K57.92] 12/06/2013  . NAUSEA AND VOMITING [R11.2] 02/09/2009  . OTHER SYMPTOMS INVOLVING DIGESTIVE SYSTEM OTHER [R19.8] 02/09/2009    Total Time spent with patient: 20 minutes    Past Medical History:  Past Medical History  Diagnosis Date  . Diverticulitis   . Attention deficit disorder   . Hiccups     pt states hiccups during sleep   . Anxiety   . Depression     Past Surgical History  Procedure Laterality Date  . Colonscopy       removed polyps   . Laparoscopic partial colectomy N/A 07/01/2014    Procedure: LAPAROSCOPIC ASSISTED SIGMOID COLECTOMY WITH MOBILIZATION OF SPLENIC FLEXURE;  Surgeon: Jackolyn Confer, MD;  Location: [facility];  Service: General;  Laterality: N/A;   Family History: History reviewed. No pertinent family history.  Social History:  History  Alcohol Use  . Yes    Comment: occasionally     History  Drug Use No    Comment: clean for 9 months--     Social History   Social History  . Marital Status: Single    Spouse Name: N/A  . Number of Children: N/A  . Years of Education: N/A   Social History Main Topics  . Smoking status: Never Smoker   . Smokeless tobacco: Current User    Types: Chew  . Alcohol Use: Yes     Comment: occasionally  . Drug Use: No     Comment: clean for 9 months--   . Sexual Activity: Yes    Birth Control/ Protection: Condom   Other Topics Concern  . None   Social History Narrative   Additional Social History:    Pain Medications: Denies abuse Prescriptions: None Over the Counter:  Denies abuse History of alcohol / drug use?: Yes Negative Consequences of Use: Financial, Personal relationships, Work / School Withdrawal Symptoms: Fever / Chills, Irritability, Agitation, Tingling, Sweats, Cramps Name of Substance 1: Heroin (IV) 1 - Age of First Use: 20 1 - Amount (size/oz): 2 grams 1 - Frequency: Daily 1 - Duration: Four years 1 - Last Use / Amount: 11/30/15, 1.5 grams Name of Substance 2: Cocaine (IV) 2 - Age of First Use: 20 2 - Amount (size/oz): 0.5-1 gram 2 - Frequency: 2-3 times per week 2 - Duration: Four years 2 - Last Use / Amount:  11/28/15, 1 gram Name of Substance 3: Meth 3 - Age of First Use: 22 3 - Amount (size/oz): Approximately $40 worth 3 - Frequency: 3-4 time per month 3 - Duration: One year 3 - Last Use / Amount: 11/28/15, $40 worth Name of Substance 4: Alcohol 4 - Age of First Use: 15 4 - Amount (size/oz): Six cans of beer 4 - Frequency: 1-2 times per month 4 - Duration: Ongoing 4 - Last Use / Amount: 11/28/15, two cans of beer            Sleep: Poor  Appetite:  Fair  Current Medications: Current Facility-Administered Medications  Medication Dose Route Frequency Provider Last Rate Last Dose  . acetaminophen (TYLENOL) tablet 650 mg  650 mg Oral Q6H PRN Laverle Hobby, PA-C   650 mg at 12/02/15 0802  . alum & mag hydroxide-simeth (MAALOX/MYLANTA) 200-200-20 MG/5ML suspension 30 mL  30 mL Oral Q4H PRN Laverle Hobby, PA-C      . citalopram (CELEXA) tablet 10 mg  10 mg Oral Daily Jenne Campus, MD   10 mg at 12/02/15 0801  . cloNIDine (CATAPRES) tablet 0.1 mg  0.1 mg Oral QID Laverle Hobby, PA-C   0.1 mg at 12/02/15 1202   Followed by  . [START ON 12/03/2015] cloNIDine (CATAPRES) tablet 0.1 mg  0.1 mg Oral BH-qamhs Spencer E Simon, PA-C       Followed by  . [START ON 12/06/2015] cloNIDine (CATAPRES) tablet 0.1 mg  0.1 mg Oral QAC breakfast Laverle Hobby, PA-C      . dicyclomine (BENTYL) tablet 20 mg  20 mg Oral Q6H PRN Laverle Hobby, PA-C   20 mg at 12/01/15 0837  . hydrOXYzine (ATARAX/VISTARIL) tablet 25 mg  25 mg Oral Q6H PRN Laverle Hobby, PA-C   25 mg at 12/02/15 0803  . loperamide (IMODIUM) capsule 2-4 mg  2-4 mg Oral PRN Laverle Hobby, PA-C      . magnesium hydroxide (MILK OF MAGNESIA) suspension 30 mL  30 mL Oral Daily PRN Laverle Hobby, PA-C      . methocarbamol (ROBAXIN) tablet 500 mg  500 mg Oral Q8H PRN Laverle Hobby, PA-C   500 mg at 12/01/15 0641  . naproxen (NAPROSYN) tablet 500 mg  500 mg Oral BID PRN Laverle Hobby, PA-C   500 mg at 12/02/15 1204  . nicotine  (NICODERM CQ - dosed in mg/24 hours) patch 14 mg  14 mg Transdermal Q0600 Laverle Hobby, PA-C   14 mg at 12/02/15 1000  . ondansetron (ZOFRAN-ODT) disintegrating tablet 4 mg  4 mg Oral Q6H PRN Laverle Hobby, PA-C      . QUEtiapine (SEROQUEL) tablet 50 mg  50 mg Oral QHS Kerrie Buffalo, NP   50 mg at 12/01/15 2103    Lab Results:  Results for orders placed or performed during  the hospital encounter of 11/30/15 (from the past 48 hour(s))  Urine rapid drug screen (hosp performed)not at [facility]     Status: Abnormal   Collection Time: 12/01/15  5:00 AM  Result Value Ref Range   Opiates POSITIVE (A) NONE DETECTED   Cocaine POSITIVE (A) NONE DETECTED   Benzodiazepines NONE DETECTED NONE DETECTED   Amphetamines NONE DETECTED NONE DETECTED   Tetrahydrocannabinol NONE DETECTED NONE DETECTED   Barbiturates NONE DETECTED NONE DETECTED    Comment:        DRUG SCREEN FOR MEDICAL PURPOSES ONLY.  IF CONFIRMATION IS NEEDED FOR ANY PURPOSE, NOTIFY LAB WITHIN 5 DAYS.        LOWEST DETECTABLE LIMITS FOR URINE DRUG SCREEN Drug Class       Cutoff (ng/mL) Amphetamine      1000 Barbiturate      200 Benzodiazepine   270 Tricyclics       623 Opiates          300 Cocaine          300 THC              50 Performed at [facility]   Urinalysis, Routine w reflex microscopic (not at [facility])     Status: None   Collection Time: 12/01/15  5:00 AM  Result Value Ref Range   Color, Urine YELLOW YELLOW   APPearance CLEAR CLEAR   Specific Gravity, Urine 1.029 1.005 - 1.030   pH 5.5 5.0 - 8.0   Glucose, UA NEGATIVE NEGATIVE mg/dL   Hgb urine dipstick NEGATIVE NEGATIVE   Bilirubin Urine NEGATIVE NEGATIVE   Ketones, ur NEGATIVE NEGATIVE mg/dL   Protein, ur NEGATIVE NEGATIVE mg/dL   Nitrite NEGATIVE NEGATIVE   Leukocytes, UA NEGATIVE NEGATIVE    Comment: MICROSCOPIC NOT DONE ON URINES WITH NEGATIVE PROTEIN, BLOOD, LEUKOCYTES, NITRITE, OR GLUCOSE <1000 mg/dL. Performed at [facility]   CBC     Status: None   Collection Time: 12/01/15  6:10 AM  Result Value Ref Range   WBC 5.1 4.0 - 10.5 K/uL   RBC 4.86 4.22 - 5.81 MIL/uL   Hemoglobin 13.5 13.0 - 17.0 g/dL   HCT 40.8 39.0 - 52.0 %   MCV 84.0 78.0 - 100.0 fL   MCH 27.8 26.0 - 34.0 pg   MCHC 33.1 30.0 - 36.0 g/dL   RDW 13.3 11.5 - 15.5 %   Platelets 206 150 - 400 K/uL    Comment: Performed at [facility]  Hemoglobin A1c     Status: None   Collection Time: 12/01/15  6:10 AM  Result Value Ref Range   Hgb A1c MFr Bld 5.0 4.8 - 5.6 %    Comment: (NOTE)         Pre-diabetes: 5.7 - 6.4         Diabetes: >6.4         Glycemic control for adults with diabetes: <7.0    Mean Plasma Glucose 97 mg/dL    Comment: (NOTE) Performed At: [facility] [place], [place] [id number] Lindon Romp MD OH:[id number] Performed at [facility]   Lipid panel, fasting     Status: None   Collection Time: 12/01/15  6:10 AM  Result Value Ref Range   Cholesterol 131 0 - 200 mg/dL   Triglycerides 118 <150 mg/dL   HDL 47 >40 mg/dL   Total CHOL/HDL Ratio 2.8 RATIO   VLDL 24 0 -  40 mg/dL   LDL Cholesterol 60 0 - 99 mg/dL    Comment:        Total Cholesterol/HDL:CHD Risk Coronary Heart Disease Risk Table                     Men   Women  1/2 Average Risk   3.4   3.3  Average Risk       5.0   4.4  2 X Average Risk   9.6   7.1  3 X Average Risk  23.4   11.0        Use the calculated Patient Ratio above and the CHD Risk Table to determine the patient's CHD Risk.        ATP III CLASSIFICATION (LDL):  <100     mg/dL   Optimal  100-129  mg/dL   Near or Above                    Optimal  130-159  mg/dL   Borderline  160-189  mg/dL   High  >190     mg/dL   Very High Performed at [facility]   Hepatic function panel     Status: None   Collection Time: 12/01/15  6:10 AM  Result Value Ref Range   Total Protein 7.2 6.5 - 8.1 g/dL   Albumin 4.3 3.5 - 5.0  g/dL   AST 16 15 - 41 U/L   ALT 18 17 - 63 U/L   Alkaline Phosphatase 69 38 - 126 U/L   Total Bilirubin 1.0 0.3 - 1.2 mg/dL   Bilirubin, Direct 0.1 0.1 - 0.5 mg/dL   Indirect Bilirubin 0.9 0.3 - 0.9 mg/dL    Comment: Performed at [facility]  TSH     Status: None   Collection Time: 12/01/15  6:10 AM  Result Value Ref Range   TSH 1.434 0.350 - 4.500 uIU/mL    Comment: Performed at [facility]    Blood Alcohol level:  Lab Results  Component Value Date   [facility] <5 16/02/9603    Metabolic Disorder Labs: Lab Results  Component Value Date   HGBA1C 5.0 12/01/2015   MPG 97 12/01/2015   No results found for: PROLACTIN Lab Results  Component Value Date   CHOL 131 12/01/2015   TRIG 118 12/01/2015   HDL 47 12/01/2015   CHOLHDL 2.8 12/01/2015   VLDL 24 12/01/2015   LDLCALC 60 12/01/2015    Physical Findings: AIMS: Facial and Oral Movements Muscles of Facial Expression: None, normal Lips and Perioral Area: None, normal Jaw: None, normal Tongue: None, normal,Extremity Movements Upper (arms, wrists, hands, fingers): None, normal Lower (legs, knees, ankles, toes): None, normal, Trunk Movements Neck, shoulders, hips: None, normal, Overall Severity Severity of abnormal movements (highest score from questions above): None, normal Incapacitation due to abnormal movements: None, normal Patient's awareness of abnormal movements (rate only patient's report): No Awareness, Dental Status Current problems with teeth and/or dentures?: No Does patient usually wear dentures?: No  CIWA:  CIWA-Ar Total: 1 COWS:  COWS Total Score: 4  Musculoskeletal: Strength & Muscle Tone: within normal limits Gait & Station: normal Patient leans: N/A  Psychiatric Specialty Exam: Physical Exam  ROS (+) headache, denies nausea, no vomiting today, (+) diarrhea   Blood pressure 99/78, pulse 79, temperature 98.4 F (36.9 C), temperature source Oral, resp. rate 18, height  6' 0.75" (1.848 m), weight 255 lb (115.667 kg).Body mass index  is 33.87 kg/(m^2).  General Appearance: Fairly Groomed  Eye Contact:  Good  Speech:  Normal Rate  Volume:  Decreased  Mood:  Anxious and Depressed  Affect:  Congruent  Thought Process:  Linear  Orientation:  Full (Time, Place, and Person)  Thought Content:  denies hallucinations, no delusions   Suicidal Thoughts:  No denies current suicidal plan or intention and contracts for safety on the unit, denies any homicidal ideations   Homicidal Thoughts:  No  Memory:  recent and remote grossly intact   Judgement:  Fair  Insight:  Present  Psychomotor Activity:  Normal  Concentration:  Concentration: Good and Attention Span: Good  Recall:  Good  Fund of Knowledge:  Good  Language:  Good  Akathisia:  Negative  Handed:  Right  AIMS (if indicated):     Assets:  Desire for Improvement Resilience  ADL's:  Intact  Cognition:  WNL  Sleep:  Number of Hours: 4.25   Assessment - patient presents depressed, constricted, anxious in affect, denies suicidal ideations. Reports ongoing opiate withdrawal symptoms and poor sleep. Denies medication side effects.  Vistaril not currently helping his anxiety. Labs unremarkable .  Treatment Plan Summary: Daily contact with patient to assess and evaluate symptoms and progress in treatment, Medication management, Plan inpatient admission  and medications as below   Encourage group and milieu participation to work on coping skills and symptom reduction  Continue to encourage sobriety, relapse prevention efforts Continue Clonidine taper to minimize symptoms of opiate WDL  D/C Hydroxyzine  Start Ativan 0.5 mgrs Q 6 hours PRN for severe anxiety, as needed  Continue Celexa 10 mgrs QDAY for depression and anxiety Increase Seroquel to 75  mgrs QHS for mood disorder, insomnia, nigh time anxiety and ruminations  Recheck EKG in AM  Treatment team working on disposition planning, patient wants to go to  residential setting, rehab after discharge   Neita Garnet, MD 12/02/2015, 2:33 PM

## 2015-12-02 NOTE — [facility]HH Group Notes (Signed)
[facility] LCSW Group Therapy  12/02/2015 3:39 PM  Type of Therapy:  Group Therapy  Participation Level:  Did Not Attend- invited. Chose to rest in bed.   Summary of Progress/Problems: Today's Topic: Overcoming Obstacles. Patients identified one short term goal and potential obstacles in reaching this goal. Patients processed barriers involved in overcoming these obstacles. Patients identified steps necessary for overcoming these obstacles and explored motivation (internal and external) for facing these difficulties head on.   Smart, Amari Burnsworth LCSW 12/02/2015, 3:39 PM

## 2015-12-02 NOTE — Progress Notes (Signed)
Data. Patient denies SI/HI/AVH.   Able to verbally contract for safety on the unit. Patient interacting well with staff and other patients. Is reporting pain and detox symptoms throughout the shift. Action. Emotional support and encouragement offered. PRN medication taken for pain and detox symptoms. Education provided on medication, indications and side effect. Q 15 minute checks done for safety. Response. Safety on the unit maintained through 15 minute checks.  Medications taken as prescribed and PRNs effective. Attended groups. Remained calm and appropriate through out shift.

## 2015-12-03 DIAGNOSIS — F1920 Other psychoactive substance dependence, uncomplicated: Secondary | ICD-10-CM

## 2015-12-03 MED ORDER — QUETIAPINE FUMARATE 50 MG PO TABS
150.0000 mg | ORAL_TABLET | Freq: Every day | ORAL | Status: DC
  Administered 2015-12-03: 150 mg via ORAL
  Filled 2015-12-03 (×2): qty 3

## 2015-12-03 MED ORDER — NICOTINE 21 MG/24HR TD PT24
21.0000 mg | MEDICATED_PATCH | Freq: Every day | TRANSDERMAL | Status: DC
  Administered 2015-12-04 – 2015-12-06 (×3): 21 mg via TRANSDERMAL
  Filled 2015-12-03 (×6): qty 1

## 2015-12-03 NOTE — Progress Notes (Signed)
Patient ID: [name], male   DOB: Mar 05, 1991, 25 y.o.   MRN: [id number] [facility] MD Progress Note  12/03/2015 3:43 PM Marquiz Sotelo  MRN:  [id number]  Subjective: Mong reports, "I'm still going through the substance withdrawal symptoms; diarrhea, high anxiety & sweating. I'm not sleeping at night. [facility]'m [facility]. Hoping that they will not ask me to not take my Seroquel & Citalopram while I'm in their program. They said that they will not allow any medicine that will make me drowsy while in the program".  Objective : I have discussed case with treatment team and have met with patient . Patient remains anxious, which he attributes to the opioid withdrawal symptoms. Describes depression, and states still has some passive thoughts of death, but denies any actual suicidal plan or intention and contracts for safety on the unit . Patient reports that he is " caught up in the life referring to his substance abuse history), and reports " I hang out with people who are selling, or using, hanging out with people who can be dangerous". He has been visible on the unit, and has been going to some groups, but states " I've been staying in my room because I am in [facility]" Planning on [facility] to [facility].  Principal Problem: Polysubstance (including opioids) dependence with physiol dependence ([place]) Diagnosis:   Patient Active Problem List   Diagnosis Date Noted  . Polysubstance (including opioids) dependence with physiol dependence ([place]) [F19.20] 08/23/2014    Priority: High  . Substance induced mood disorder ([place]) [F19.94]   . Atypical depression ([place]) [F32.89]   . Sigmoid diverticulitis s/p lap assisted sigmoid colectomy 07/01/14 [K57.32] 07/01/2014  . Diverticulitis [K57.92] 12/06/2013  . Diverticulitis of large intestine without perforation or abscess without bleeding [K57.32] 12/06/2013  . Obesity, unspecified [E66.9] 12/06/2013  . Acute  diverticulitis [K57.92] 12/06/2013  . NAUSEA AND VOMITING [R11.2] 02/09/2009  . OTHER SYMPTOMS INVOLVING DIGESTIVE SYSTEM OTHER [R19.8] 02/09/2009   Total Time spent with patient: 15 minutes  Past Medical History:  Past Medical History  Diagnosis Date  . Diverticulitis   . Attention deficit disorder   . Hiccups     pt states hiccups during sleep   . Anxiety   . Depression     Past Surgical History  Procedure Laterality Date  . Colonscopy       removed polyps   . Laparoscopic partial colectomy N/A 07/01/2014    Procedure: LAPAROSCOPIC ASSISTED SIGMOID COLECTOMY WITH MOBILIZATION OF SPLENIC FLEXURE;  Surgeon: Jackolyn Confer, MD;  Location: [facility];  Service: General;  Laterality: N/A;   Family History: History reviewed. No pertinent family history.  Social History:  History  Alcohol Use  . Yes    Comment: occasionally     History  Drug Use No    Comment: clean for 9 months--     Social History   Social History  . Marital Status: Single    Spouse Name: N/A  . Number of Children: N/A  . Years of Education: N/A   Social History Main Topics  . Smoking status: Never Smoker   . Smokeless tobacco: Current User    Types: Chew  . Alcohol Use: Yes     Comment: occasionally  . Drug Use: No     Comment: clean for 9 months--   . Sexual Activity: Yes    Birth Control/ Protection: Condom   Other Topics Concern  . None  Social History Narrative   Additional Social History:    Pain Medications: Denies abuse Prescriptions: None Over the Counter: Denies abuse History of alcohol / drug use?: Yes Negative Consequences of Use: Financial, Personal relationships, Work / Youth worker Withdrawal Symptoms: Fever / Chills, Irritability, Agitation, Tingling, Sweats, Cramps Name of Substance 1: Heroin (IV) 1 - Age of First Use: 20 1 - Amount (size/oz): 2 grams 1 - Frequency: Daily 1 - Duration: Four years 1 - Last Use / Amount: 11/30/15, 1.5 grams Name of Substance 2: Cocaine  (IV) 2 - Age of First Use: 20 2 - Amount (size/oz): 0.5-1 gram 2 - Frequency: 2-3 times per week 2 - Duration: Four years 2 - Last Use / Amount: 11/28/15, 1 gram Name of Substance 3: Meth 3 - Age of First Use: 22 3 - Amount (size/oz): Approximately $40 worth 3 - Frequency: 3-4 time per month 3 - Duration: One year 3 - Last Use / Amount: 11/28/15, $40 worth Name of Substance 4: Alcohol 4 - Age of First Use: 15 4 - Amount (size/oz): Six cans of beer 4 - Frequency: 1-2 times per month 4 - Duration: Ongoing 4 - Last Use / Amount: 11/28/15, two cans of beer  Sleep: "Improving"  Appetite:  Fair  Current Medications: Current Facility-Administered Medications  Medication Dose Route Frequency Provider Last Rate Last Dose  . acetaminophen (TYLENOL) tablet 650 mg  650 mg Oral Q6H PRN Laverle Hobby, PA-C   650 mg at 12/03/15 1135  . alum & mag hydroxide-simeth (MAALOX/MYLANTA) 200-200-20 MG/5ML suspension 30 mL  30 mL Oral Q4H PRN Laverle Hobby, PA-C      . citalopram (CELEXA) tablet 10 mg  10 mg Oral Daily Jenne Campus, MD   10 mg at 12/03/15 0758  . cloNIDine (CATAPRES) tablet 0.1 mg  0.1 mg Oral BH-qamhs Laverle Hobby, PA-C       Followed by  . [START ON 12/06/2015] cloNIDine (CATAPRES) tablet 0.1 mg  0.1 mg Oral QAC breakfast Laverle Hobby, PA-C      . dicyclomine (BENTYL) tablet 20 mg  20 mg Oral Q6H PRN Laverle Hobby, PA-C   20 mg at 12/01/15 0837  . loperamide (IMODIUM) capsule 2-4 mg  2-4 mg Oral PRN Laverle Hobby, PA-C   4 mg at 12/03/15 0800  . LORazepam (ATIVAN) tablet 0.5 mg  0.5 mg Oral Q6H PRN Jenne Campus, MD   0.5 mg at 12/03/15 1415  . magnesium hydroxide (MILK OF MAGNESIA) suspension 30 mL  30 mL Oral Daily PRN Laverle Hobby, PA-C      . methocarbamol (ROBAXIN) tablet 500 mg  500 mg Oral Q8H PRN Laverle Hobby, PA-C   500 mg at 12/03/15 0758  . naproxen (NAPROSYN) tablet 500 mg  500 mg Oral BID PRN Laverle Hobby, PA-C   500 mg at 12/03/15 0758  .  [START ON 12/04/2015] nicotine (NICODERM CQ - dosed in mg/24 hours) patch 21 mg  21 mg Transdermal Q0600 Laverle Hobby, PA-C      . ondansetron (ZOFRAN-ODT) disintegrating tablet 4 mg  4 mg Oral Q6H PRN Laverle Hobby, PA-C   4 mg at 12/03/15 0758  . QUEtiapine (SEROQUEL) tablet 150 mg  150 mg Oral QHS Encarnacion Slates, NP        Lab Results:  No results found for this or any previous visit (from the past 48 hour(s)).  Blood Alcohol level:  Lab Results  Component  Value Date   ETH <5 58/01/9832    Metabolic Disorder Labs: Lab Results  Component Value Date   HGBA1C 5.0 12/01/2015   MPG 97 12/01/2015   No results found for: PROLACTIN Lab Results  Component Value Date   CHOL 131 12/01/2015   TRIG 118 12/01/2015   HDL 47 12/01/2015   CHOLHDL 2.8 12/01/2015   VLDL 24 12/01/2015   LDLCALC 60 12/01/2015   Physical Findings: AIMS: Facial and Oral Movements Muscles of Facial Expression: None, normal Lips and Perioral Area: None, normal Jaw: None, normal Tongue: None, normal,Extremity Movements Upper (arms, wrists, hands, fingers): None, normal Lower (legs, knees, ankles, toes): None, normal, Trunk Movements Neck, shoulders, hips: None, normal, Overall Severity Severity of abnormal movements (highest score from questions above): None, normal Incapacitation due to abnormal movements: None, normal Patient's awareness of abnormal movements (rate only patient's report): No Awareness, Dental Status Current problems with teeth and/or dentures?: No Does patient usually wear dentures?: No  CIWA:  CIWA-Ar Total: 0 COWS:  COWS Total Score: 5  Musculoskeletal: Strength & Muscle Tone: within normal limits Gait & Station: normal Patient leans: N/A  Psychiatric Specialty Exam: Physical Exam  ROS (+) headache, denies nausea, no vomiting today, (+) diarrhea   Blood pressure 111/59, pulse 79, temperature 97.8 F (36.6 C), temperature source Oral, resp. rate 18, height 6' 0.75" (1.848 m),  weight 115.667 kg (255 lb).Body mass index is 33.87 kg/(m^2).  General Appearance: Fairly Groomed  Eye Contact:  Good  Speech:  Normal Rate  Volume:  Decreased  Mood:  Anxious and Depressed  Affect:  Congruent  Thought Process:  Linear  Orientation:  Full (Time, Place, and Person)  Thought Content:  denies hallucinations, no delusions   Suicidal Thoughts:  No denies current suicidal plan or intention and contracts for safety on the unit, denies any homicidal ideations   Homicidal Thoughts:  No  Memory:  recent and remote grossly intact   Judgement:  Fair  Insight:  Present  Psychomotor Activity:  Normal  Concentration:  Concentration: Good and Attention Span: Good  Recall:  Good  Fund of Knowledge:  Good  Language:  Good  Akathisia:  Negative  Handed:  Right  AIMS (if indicated):     Assets:  Desire for Improvement Resilience  ADL's:  Intact  Cognition:  WNL  Sleep:  Number of Hours: 6   Assessment - patient presents depressed, constricted, anxious in affect, denies suicidal ideations. Reports ongoing opiate withdrawal symptoms and poor sleep. Denies medication side effects.  Vistaril not currently helping his anxiety. Labs unremarkable .  Treatment Plan Summary: Daily contact with patient to assess and evaluate symptoms and progress in treatment, Medication management, Plan inpatient admission  and medications as below   Encourage group and milieu participation to work on coping skills and symptom reduction  Continue to encourage sobriety, relapse prevention efforts Continue Clonidine taper to minimize symptoms of opiate WDL  Start Ativan 0.5 mgrs Q 6 hours PRN for severe anxiety, as needed  Continue Celexa 10 mgrs QDAY for depression and anxiety Increased Seroquel to 150  mgrs QHS for mood disorder, insomnia, nigh time anxiety and ruminations  Recheck EKG in AM, result reviewed, wnl. Treatment team working on disposition planning, patient wants to go to residential setting,  rehab after discharge  Encarnacion Slates, NP , PMHNP, FNP-BC  12/03/2015, 3:43 PM Agree with NP progress note as above

## 2015-12-03 NOTE — [facility] Group Notes (Signed)
[facility] Group Notes:  (Nursing/MHT/Case Management/Adjunct)  Date:  12/03/2015  Time:  10:18 AM  Type of Therapy:  Psychoeducational Skills  Participation Level:  Did Not Attend  Participation Quality:  N/A  Affect:  N/A  Cognitive:  N/A  Insight:  None  Engagement in Group:  None  Modes of Intervention:  Discussion and Education  Summary of Progress/Problems: Patient was invited to group but did not attend.   Avory Mimbs E 12/03/2015, 10:18 AM

## 2015-12-03 NOTE — Progress Notes (Signed)
Patient ID: [name], male   DOB: 09/01/1990, 25 y.o.   MRN: [id number]  DAR: Pt. Denies SI/HI and A/V Hallucinations. He reports sleep is good, appetite is fair, energy level is low, and concentration is poor. He rates depression 5/10, hopelessness 4/10, and anxiety 7/10. Patient does not report any pain or discomfort at this time. Support and encouragement provided to the patient. Scheduled medications administered to patient per physician's orders. Patient continues to report withdrawal symptoms including pain, tremors, diarrhea, nausea, and anxiety. PRN medications administered for these reported symptoms. Patient is minimal but cooperative. He is seen in the milieu and is attending groups. EKG was performed and results were placed on patient's chart.  Q15 minute checks are maintained for safety.

## 2015-12-03 NOTE — [facility] Group Notes (Signed)
[facility] LCSW Group Therapy  12/03/2015 12:44 PM  Type of Therapy:  Group Therapy  Participation Level:  Active  Participation Quality:  Attentive  Affect:  Appropriate  Cognitive:  Alert and Oriented  Insight:  Improving  Engagement in Therapy:  Improving  Modes of Intervention:  Confrontation, Discussion, Education, Exploration, Problem-solving, Rapport Building, Socialization and Support  Summary of Progress/Problems: Emotion Regulation: This group focused on both positive and negative emotion identification and allowed group members to process ways to identify feelings, regulate negative emotions, and find healthy ways to manage internal/external emotions. Group members were asked to reflect on a time when their reaction to an emotion led to a negative outcome and explored how alternative responses using emotion regulation would have benefited them. Group members were also asked to discuss a time when emotion regulation was utilized when a negative emotion was experienced. [name] was attentive and engaged during today's processing group. He shared that he struggles with depression, shame,guilt due to his relapse and lifestyle. [name] is happy to be accepted to [facility] in [place] and hopes to "get a new start there." [name] states that he abused heroin to self medicate and is ready for help. He continues to make progress in the group setting with improving insight.   Smart, Zannah Melucci  LCSW  12/03/2015, 12:44 PM

## 2015-12-04 MED ORDER — TRAZODONE HCL 50 MG PO TABS
50.0000 mg | ORAL_TABLET | Freq: Every evening | ORAL | Status: DC | PRN
  Administered 2015-12-04 – 2015-12-06 (×3): 50 mg via ORAL
  Filled 2015-12-04 (×2): qty 1
  Filled 2015-12-04: qty 7
  Filled 2015-12-04: qty 1

## 2015-12-04 MED ORDER — CITALOPRAM HYDROBROMIDE 20 MG PO TABS
20.0000 mg | ORAL_TABLET | Freq: Every day | ORAL | Status: DC
  Administered 2015-12-05 – 2015-12-07 (×3): 20 mg via ORAL
  Filled 2015-12-04: qty 7
  Filled 2015-12-04 (×3): qty 1
  Filled 2015-12-04: qty 7
  Filled 2015-12-04: qty 1

## 2015-12-04 NOTE — Progress Notes (Signed)
Patient did attend the evening speaker AA meeting.  

## 2015-12-04 NOTE — [facility] Group Notes (Signed)
[facility] LCSW Group Therapy  12/04/2015 3:58 PM  Type of Therapy:  Group Therapy  Participation Level:  Active  Participation Quality:  Attentive  Affect:  Appropriate  Cognitive:  Alert and Oriented  Insight:  Improving  Engagement in Therapy:  Engaged  Modes of Intervention:  Confrontation, Discussion, Education, Exploration, Problem-solving, Rapport Building, Socialization and Support  Summary of Progress/Problems: Feelings around Relapse. Group members discussed the meaning of relapse and shared personal stories of relapse, how it affected them and others, and how they perceived themselves during this time. Group members were encouraged to identify triggers, warning signs and coping skills used when facing the possibility of relapse. Social supports were discussed and explored in detail. [name] was attentive and engaged during today's processing group. He shared that he relapsed due to selling drugs and being tempted. He reports that everything in his life has been negatively impacted by his heroin addiction and he is hopeful about getting into [facility]. He continues to show progress in the group setting and demonstrates improving insight.   Smart, Macguire Holsinger LCSW 12/04/2015, 3:58 PM

## 2015-12-04 NOTE — Progress Notes (Signed)
D   Pt is pleasant on  Approach and is compliant with treatment   He interacts well with others   He continues to have much anxiety and difficulty falling asleep but seems to be trying hard to do so without a lot of extra medications A    Verbal support given   Medications administered and effectiveness monitored   Q 15 min checks   Educate on other ways to help improve falling asleep R   Pt safe at present time

## 2015-12-04 NOTE — Progress Notes (Signed)
D    Pt having difficulty falling asleep tonight   He reports night time is worse for him   He said he uses drugs all day and night    He appears very anxious and depressed   Pt received many medications to aid in anxiety and sleep but had not fallen asleep by 100 am   He had just been sitting on the edge of his bed    A    Verbal support given   Medications administered and effectiveness monitored   Q 15 min checks  Administered prn medications and discussed withdrawal and detox from the drugs he was using R    Pt seemed relieved after discussion and said he would try again to fall asleep

## 2015-12-04 NOTE — Tx Team (Signed)
Interdisciplinary Treatment Plan Update (Adult)  Date:  12/04/2015  Time Reviewed:  10:49 AM   Progress in Treatment: Attending groups: Yes Participating in groups:  Yes Taking medication as prescribed:  Yes. Tolerating medication:  Yes. Family/Significant othe contact made:  SPE completed with pt; pt declined to consent to family contact.  Patient understands diagnosis:  Yes. and As evidenced by:  seeking treatment for heroin abuse, cocaine abuse, methamphetamine abuse, alcohol abuse, depression, SI, and for medication stabilization. Discussing patient identified problems/goals with staff:  Yes. Medical problems stabilized or resolved:  Yes. Denies suicidal/homicidal ideation: Yes. Issues/concerns per patient self-inventory:  Other:   Discharge Plan or Barriers: Pt referred to [place] but plans to d/c directly to [facility] on Monday. Pt plans to follow-up at [facility]   Reason for Continuation of Hospitalization: Depression Medication stabilization Withdrawal symptoms  Comments:  [name] is an 25 y.o. single male who presents to [facility] accompanied by his parents, who did not participate in assessment. Pt reports he is engaging in heavy drug use, is unable to stop and is having suicidal ideation with thoughts of wrecking his car or shooting himself with a gun. Pt reports he has access to a gun at his home. Pt report he has been using substances regularly since age 13. He is currently using IV heroin, intravenous cocaine, methamphetamine and alcohol. Pt denies any history of suicide attempts. He denies any history of intentional self-injurious behavior. Pt denies homicidal ideation or history of violence. He denies any history of psychotic symptoms.Pt identifies consequences of his substance use and need to acquire drugs daily as his primary stressor. He states he is seeking treatment at this time because his life has become unmanageable and he recognizes he cannot  quit without treatment. Pt reports he lives with his parents due to his substance use. 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 Pt received inpatient dual-diagnosis treatment at [facility] 08/23/14-14/14/16 and was diagnosed with a mood disorder and substance use disorders. He then went to [facility]. Pt denies current outpatient providers.Diagnosis: Substance-induced Mood Disorder; Opioid Use Disorder, Severe; Cocaine Use Disorder, Severe; Methamphetamine Use Disorder, Severe; Alcohol Use Disorder, Mild  Estimated length of stay:  3 days (tentative discharge planned for Monday).   Additional Comments:  Patient and CSW reviewed pt's identified goals and treatment plan. Patient verbalized understanding and agreed to treatment plan. CSW reviewed [facility] "Discharge Process and Patient Involvement" Form. Pt verbalized understanding of information provided and signed form.    Review of initial/current patient goals per problem list:  1. Goal(s): Patient will participate in aftercare plan  Met: Yes  Target date: at discharge  As evidenced by: Patient will participate within aftercare plan AEB aftercare provider and housing plan at discharge being identified.  7/18: CSW assessing for appropriate referrals.   7/21: Pt reports that he will discharge to [facility] for outpatient mental health services.   2. Goal (s): Patient will exhibit decreased depressive symptoms and suicidal ideations.  Met:Yes   Target date: at discharge  As evidenced by: Patient will utilize self rating of depression at 3 or below and demonstrate decreased signs of depression or be deemed stable for discharge by MD.  7/18: Pt rates depression as high. Passive SI at times. No plan/intent. Pt able to contract for safety on the unit.   7/21: Pt rates depression as 2/10 and presents with pleasant  mood/calm affect. He denies SI/HI/AVH. Pt pleasant and upbeat on the  unit today.   3. Goal(s): Patient will demonstrate decreased signs of withdrawal due to substance abuse  Met: Yes   Target date:at discharge   As evidenced by: Patient will produce a CIWA/COWS score of 0, have stable vitals signs, and no symptoms of withdrawal.  7/18: Pt reports mild withdrawals with COWS of 3 and low standing/sitting BP.   7/21: Pt reports no signs of withdrawal. Latest COWS of 2 and stable vitals. Per MD, pt is medically stable for discharge on Monday.   Attendees: Patient:   12/04/2015 10:49 AM   Family:   12/04/2015 10:49 AM   Physician:  Dr. Parke Poisson MD 12/04/2015 10:49 AM   Nursing:   Jolayne Haines RN; Patty RN 12/04/2015 10:49 AM   Clinical Social Worker: Maxie Better, LCSW 12/04/2015 10:49 AM   Clinical Social Worker:Lauren Madie Reno 12/04/2015 10:49 AM   Other:  Gerline Legacy Nurse Case Manager 12/04/2015 10:49 AM   Other:  Agustina Caroli NP; May Augustin NP 12/04/2015 10:49 AM   Other:   12/04/2015 10:49 AM   Other:  12/04/2015 10:49 AM   Other:  12/04/2015 10:49 AM   Other:  12/04/2015 10:49 AM    12/04/2015 10:49 AM    12/04/2015 10:49 AM    12/04/2015 10:49 AM    12/04/2015 10:49 AM    Scribe for Treatment Team:   Maxie Better, LCSW 12/04/2015 10:49 AM

## 2015-12-04 NOTE — Progress Notes (Signed)
CSW faxed medication list ATTN Law[facility]ng[facility] per pt request. He is accepted for admission for Monday and plans to discharge at 11am.  Trula SladeHeather Smart, MSW, LCSW Clinical Social Worker 12/04/2015 3:58 PM

## 2015-12-04 NOTE — Progress Notes (Signed)
Data. Patient denies SI/HI/AVH.   Patient interacting well with staff and other patients. On his self inventory he reported 2/10 for depression, 0/10 for hopelessness and 7/10 for anxiety. He continues to report chronic pain. Action. Emotional support and encouragement offered. Education provided on medication, indications and side effect. Q 15 minute checks done for safety. PRN medications given for pain/anxiety. Response. Safety on the unit maintained through 15 minute checks.  Medications taken as prescribed. Attended groups. Remained calm and appropriate through out shift. PRNs with decrease in symptoms.

## 2015-12-04 NOTE — Progress Notes (Signed)
Patient ID: [name], male   DOB: [birth year], 25 y.o.   MRN: [id number] [facility] MD Progress Note  12/04/2015 9:20 AM [name]  MRN:  [id number]  Subjective: Patient reports he is feeling partially better, less severely depressed, but does continue to report anxiety,some insomnia. He states he has episodic feelings of  his forearm vein " the one where I shoot up"  somehow vibrating or pulsating, and that this is a very uncomfortable sensation which causes him to crave. Denies any associated symptoms - no erythema, no pain, no sensory changes  He reports his symptoms of opiate withdrawal are subsiding , and feels better overall. Denies medication side effects.  Objective : I have discussed case with treatment team and have met with patient . Patient remains vaguely anxious, but his mood is improving and his range of affect is significantly improved . At this time denies any suicidal ideations, and as he improves he is becoming more focused on disposition planning options . As noted, reports improved symptoms of opiate WDL and at this time presents calm, in no acute distress or discomfort . Continues to be motivated in going to the [facility] after discharge. States that they may have restrictions on the medications he is allowed, and as discussed with CSW Seroquel is not allowed there, so we discussed other options . No disruptive or agitated behaviors on the unit, going to some groups.  Denies medication side effects.  Principal Problem: Polysubstance (including opioids) dependence with physiol dependence ([place]) Diagnosis:   Patient Active Problem List   Diagnosis Date Noted  . Substance induced mood disorder ([place]) [F19.94]   . Atypical depression ([place]) [F32.89]   . Polysubstance (including opioids) dependence with physiol dependence ([place]) [F19.20] 08/23/2014  . Sigmoid diverticulitis s/p lap assisted sigmoid colectomy 07/01/14 [K57.32] 07/01/2014  . Diverticulitis [K57.92] 12/06/2013   . Diverticulitis of large intestine without perforation or abscess without bleeding [K57.32] 12/06/2013  . Obesity, unspecified [E66.9] 12/06/2013  . Acute diverticulitis [K57.92] 12/06/2013  . NAUSEA AND VOMITING [R11.2] 02/09/2009  . OTHER SYMPTOMS INVOLVING DIGESTIVE SYSTEM OTHER [R19.8] 02/09/2009   Total Time spent with patient:  20 minutes   Past Medical History:  Past Medical History  Diagnosis Date  . Diverticulitis   . Attention deficit disorder   . Hiccups     pt states hiccups during sleep   . Anxiety   . Depression     Past Surgical History  Procedure Laterality Date  . Colonscopy       removed polyps   . Laparoscopic partial colectomy N/A 07/01/2014    Procedure: LAPAROSCOPIC ASSISTED SIGMOID COLECTOMY WITH MOBILIZATION OF SPLENIC FLEXURE;  Surgeon: Jackolyn Confer, MD;  Location: [facility];  Service: General;  Laterality: N/A;   Family History: History reviewed. No pertinent family history.  Social History:  History  Alcohol Use  . Yes    Comment: occasionally     History  Drug Use No    Comment: clean for 9 months--     Social History   Social History  . Marital Status: Single    Spouse Name: N/A  . Number of Children: N/A  . Years of Education: N/A   Social History Main Topics  . Smoking status: Never Smoker   . Smokeless tobacco: Current User    Types: Chew  . Alcohol Use: Yes     Comment: occasionally  . Drug Use: No     Comment: clean for 9 months--   . Sexual  Activity: Yes    Birth Control/ Protection: Condom   Other Topics Concern  . None   Social History Narrative   Additional Social History:    Pain Medications: Denies abuse Prescriptions: None Over the Counter: Denies abuse History of alcohol / drug use?: Yes Negative Consequences of Use: Financial, Personal relationships, Work / Youth worker Withdrawal Symptoms: Fever / Chills, Irritability, Agitation, Tingling, Sweats, Cramps Name of Substance 1: Heroin (IV) 1 - Age of First Use:  20 1 - Amount (size/oz): 2 grams 1 - Frequency: Daily 1 - Duration: Four years 1 - Last Use / Amount: 11/30/15, 1.5 grams Name of Substance 2: Cocaine (IV) 2 - Age of First Use: 20 2 - Amount (size/oz): 0.5-1 gram 2 - Frequency: 2-3 times per week 2 - Duration: Four years 2 - Last Use / Amount: 11/28/15, 1 gram Name of Substance 3: Meth 3 - Age of First Use: 22 3 - Amount (size/oz): Approximately $40 worth 3 - Frequency: 3-4 time per month 3 - Duration: One year 3 - Last Use / Amount: 11/28/15, $40 worth Name of Substance 4: Alcohol 4 - Age of First Use: 15 4 - Amount (size/oz): Six cans of beer 4 - Frequency: 1-2 times per month 4 - Duration: Ongoing 4 - Last Use / Amount: 11/28/15, two cans of beer  Sleep: fair   Appetite:  Improved   Current Medications: Current Facility-Administered Medications  Medication Dose Route Frequency Provider Last Rate Last Dose  . acetaminophen (TYLENOL) tablet 650 mg  650 mg Oral Q6H PRN Laverle Hobby, PA-C   650 mg at 12/04/15 3358  . alum & mag hydroxide-simeth (MAALOX/MYLANTA) 200-200-20 MG/5ML suspension 30 mL  30 mL Oral Q4H PRN Laverle Hobby, PA-C      . citalopram (CELEXA) tablet 10 mg  10 mg Oral Daily Jenne Campus, MD   10 mg at 12/04/15 2518  . cloNIDine (CATAPRES) tablet 0.1 mg  0.1 mg Oral BH-qamhs Spencer E Simon, PA-C   0.1 mg at 12/04/15 9842   Followed by  . [START ON 12/06/2015] cloNIDine (CATAPRES) tablet 0.1 mg  0.1 mg Oral QAC breakfast Laverle Hobby, PA-C      . dicyclomine (BENTYL) tablet 20 mg  20 mg Oral Q6H PRN Laverle Hobby, PA-C   20 mg at 12/01/15 0837  . loperamide (IMODIUM) capsule 2-4 mg  2-4 mg Oral PRN Laverle Hobby, PA-C   4 mg at 12/03/15 0800  . LORazepam (ATIVAN) tablet 0.5 mg  0.5 mg Oral Q6H PRN Jenne Campus, MD   0.5 mg at 12/04/15 0810  . magnesium hydroxide (MILK OF MAGNESIA) suspension 30 mL  30 mL Oral Daily PRN Laverle Hobby, PA-C      . methocarbamol (ROBAXIN) tablet 500 mg  500  mg Oral Q8H PRN Laverle Hobby, PA-C   500 mg at 12/04/15 0103  . naproxen (NAPROSYN) tablet 500 mg  500 mg Oral BID PRN Laverle Hobby, PA-C   500 mg at 12/04/15 0103  . nicotine (NICODERM CQ - dosed in mg/24 hours) patch 21 mg  21 mg Transdermal Q0600 Laverle Hobby, PA-C   21 mg at 12/04/15 1031  . ondansetron (ZOFRAN-ODT) disintegrating tablet 4 mg  4 mg Oral Q6H PRN Laverle Hobby, PA-C   4 mg at 12/03/15 0758  . QUEtiapine (SEROQUEL) tablet 150 mg  150 mg Oral QHS Encarnacion Slates, NP   150 mg at 12/03/15 2159  Lab Results:  No results found for this or any previous visit (from the past 48 hour(s)).  Blood Alcohol level:  Lab Results  Component Value Date   ETH <5 57/84/6962    Metabolic Disorder Labs: Lab Results  Component Value Date   HGBA1C 5.0 12/01/2015   MPG 97 12/01/2015   No results found for: PROLACTIN Lab Results  Component Value Date   CHOL 131 12/01/2015   TRIG 118 12/01/2015   HDL 47 12/01/2015   CHOLHDL 2.8 12/01/2015   VLDL 24 12/01/2015   LDLCALC 60 12/01/2015   Physical Findings: AIMS: Facial and Oral Movements Muscles of Facial Expression: None, normal Lips and Perioral Area: None, normal Jaw: None, normal Tongue: None, normal,Extremity Movements Upper (arms, wrists, hands, fingers): None, normal Lower (legs, knees, ankles, toes): None, normal, Trunk Movements Neck, shoulders, hips: None, normal, Overall Severity Severity of abnormal movements (highest score from questions above): None, normal Incapacitation due to abnormal movements: None, normal Patient's awareness of abnormal movements (rate only patient's report): No Awareness, Dental Status Current problems with teeth and/or dentures?: No Does patient usually wear dentures?: No  CIWA:  CIWA-Ar Total: 0 COWS:  COWS Total Score: 5  Musculoskeletal: Strength & Muscle Tone: within normal limits Gait & Station: normal Patient leans: N/A  Psychiatric Specialty Exam: Physical Exam  ROS  Denies headache at this time, no chest pain or shortness of breath ,  denies nausea, no vomiting today, diarrhea has subsided    Blood pressure 126/83, pulse 79, temperature 98 F (36.7 C), temperature source Oral, resp. rate 16, height 6' 0.75" (1.848 m), weight 255 lb (115.667 kg).Body mass index is 33.87 kg/(m^2).  General Appearance: improved grooming   Eye Contact:  Good  Speech:  Normal Rate  Volume:  Decreased  Mood:  Less depressed, improved range of affect   Affect:  Still anxious, constricted, but more reactive, smiles at times appropriately   Thought Process:  Linear  Orientation:  Full (Time, Place, and Person)  Thought Content:  denies hallucinations, no delusions   Suicidal Thoughts:  No denies current suicidal plan or intention and contracts for safety on the unit, denies any homicidal ideations   Homicidal Thoughts:  No  Memory:  recent and remote grossly intact   Judgement:  Improving   Insight:  Present  Psychomotor Activity:  Normal- no acute distress, presents calm  Concentration:  Concentration: Good and Attention Span: Good  Recall:  Good  Fund of Knowledge:  Good  Language:  Good  Akathisia:  Negative  Handed:  Right  AIMS (if indicated):     Assets:  Desire for Improvement Resilience  ADL's:  Intact  Cognition:  WNL  Sleep:  Number of Hours: 4.25   Assessment - patient presents partially improved, with improving mood and range of affect, although still vaguely depressed and anxious. Opiate withdrawal symptoms are subsiding . He is tolerating medications well , but states Seroquel not helping his insomnia much at this time . As noted, he is interested in going to [facility], a [facility] based long term recovery program, but report is that he will not be allowed to be on Seroquel there . We discussed other options and agrees to Trazodone trial- side effect profile reviewed . Thus far tolerating Celexa trial well , feels " it is starting to help"  Treatment  Plan Summary: Daily contact with patient to assess and evaluate symptoms and progress in treatment, Medication management, Plan inpatient admission  and medications  as below   Encourage group and milieu participation to work on coping skills and symptom reduction  Continue to encourage sobriety, relapse prevention efforts Continue Clonidine taper to minimize symptoms of opiate WDL  Continue Ativan 0.5 mgrs Q 6 hours PRN for severe anxiety, as needed  Increase  Celexa to 20  mgrs QDAY for depression and anxiety D/C Seroquel  Based on above  Start Trazodone 50 mgrs QHS for insomnia - side effects discussed  Treatment team working on disposition planning as above- currently interested in going to [facility] after discharge.    Neita Garnet, MD , 12/04/2015, 9:20 AM

## 2015-12-05 MED ORDER — HYDROXYZINE HCL 25 MG PO TABS
25.0000 mg | ORAL_TABLET | ORAL | Status: DC | PRN
  Administered 2015-12-05 – 2015-12-06 (×2): 25 mg via ORAL
  Filled 2015-12-05 (×2): qty 1

## 2015-12-05 MED ORDER — BUSPIRONE HCL 5 MG PO TABS
5.0000 mg | ORAL_TABLET | Freq: Three times a day (TID) | ORAL | Status: DC
  Administered 2015-12-05 – 2015-12-07 (×5): 5 mg via ORAL
  Filled 2015-12-05 (×6): qty 1
  Filled 2015-12-05 (×3): qty 21
  Filled 2015-12-05 (×2): qty 1
  Filled 2015-12-05: qty 21
  Filled 2015-12-05: qty 1

## 2015-12-05 NOTE — Progress Notes (Signed)
D   Pt is pleasant on  Approach and is compliant with treatment   He interacts well with others   He continues to have much anxiety and difficulty falling asleep but seems to be trying hard to do so without a lot of extra medications A    Verbal support given   Medications administered and effectiveness monitored   Q 15 min checks   Educate on other ways to help improve falling asleep R   Pt safe at present time

## 2015-12-05 NOTE — Progress Notes (Signed)
[facility] MD Progress Note  12/05/2015 3:53 PM [name]  MRN:  [id number]  Subjective: Patient reports " I need a increase of my ativan for my anxiety." patient is requesting a medication to block opiates' (vivitrol)  Objective : [name] is awake, alert and oriented X4. Seen resting in dayroom interacting with peers.  Denies suicidal or homicidal ideation. Denies auditory or visual hallucination and does not appear to be responding to internal stimuli. Patient reports increased anxiety and states that ativan is not helping with his anxiety. Patient reports he is medication compliant without mediation side effects.  States his depression 3/10.  Reports good appetite  and resting well. Patient report he is excited regarding discharge and is eager to go to the [facility]. Support, encouragement and reassurance was provided.   Principal Problem: Polysubstance (including opioids) dependence with physiol dependence ([facility]) Diagnosis:   Patient Active Problem List   Diagnosis Date Noted  . Substance induced mood disorder ([facility]) [F19.94]   . Atypical depression ([facility]) [F32.89]   . Polysubstance (including opioids) dependence with physiol dependence ([facility]) [F19.20] 08/23/2014  . Sigmoid diverticulitis s/p lap assisted sigmoid colectomy 07/01/14 [K57.32] 07/01/2014  . Diverticulitis [K57.92] 12/06/2013  . Diverticulitis of large intestine without perforation or abscess without bleeding [K57.32] 12/06/2013  . Obesity, unspecified [E66.9] 12/06/2013  . Acute diverticulitis [K57.92] 12/06/2013  . NAUSEA AND VOMITING [R11.2] 02/09/2009  . OTHER SYMPTOMS INVOLVING DIGESTIVE SYSTEM OTHER [R19.8] 02/09/2009   Total Time spent with patient:  20 minutes   Past Medical History:  Past Medical History  Diagnosis Date  . Diverticulitis   . Attention deficit disorder   . Hiccups     pt states hiccups during sleep   . Anxiety   . Depression     Past Surgical History  Procedure Laterality Date  .  Colonscopy       removed polyps   . Laparoscopic partial colectomy N/A 07/01/2014    Procedure: LAPAROSCOPIC ASSISTED SIGMOID COLECTOMY WITH MOBILIZATION OF SPLENIC FLEXURE;  Surgeon: Avel Peace, MD;  Location: [facility];  Service: General;  Laterality: N/A;   Family History: History reviewed. No pertinent family history.  Social History:  History  Alcohol Use  . Yes    Comment: occasionally     History  Drug Use No    Comment: clean for 9 months--     Social History   Social History  . Marital Status: Single    Spouse Name: N/A  . Number of Children: N/A  . Years of Education: N/A   Social History Main Topics  . Smoking status: Never Smoker   . Smokeless tobacco: Current User    Types: Chew  . Alcohol Use: Yes     Comment: occasionally  . Drug Use: No     Comment: clean for 9 months--   . Sexual Activity: Yes    Birth Control/ Protection: Condom   Other Topics Concern  . None   Social History Narrative   Additional Social History:    Pain Medications: Denies abuse Prescriptions: None Over the Counter: Denies abuse History of alcohol / drug use?: Yes Negative Consequences of Use: Financial, Personal relationships, Work / Programmer, multimedia Withdrawal Symptoms: Fever / Chills, Irritability, Agitation, Tingling, Sweats, Cramps Name of Substance 1: Heroin (IV) 1 - Age of First Use: 20 1 - Amount (size/oz): 2 grams 1 - Frequency: Daily 1 - Duration: Four years 1 - Last Use / Amount: 11/30/15, 1.5 grams Name of Substance 2: Cocaine (IV)  2 - Age of First Use: 20 2 - Amount (size/oz): 0.5-1 gram 2 - Frequency: 2-3 times per week 2 - Duration: Four years 2 - Last Use / Amount: 11/28/15, 1 gram Name of Substance 3: Meth 3 - Age of First Use: 22 3 - Amount (size/oz): Approximately $40 worth 3 - Frequency: 3-4 time per month 3 - Duration: One year 3 - Last Use / Amount: 11/28/15, $40 worth Name of Substance 4: Alcohol 4 - Age of First Use: 15 4 - Amount (size/oz): Six  cans of beer 4 - Frequency: 1-2 times per month 4 - Duration: Ongoing 4 - Last Use / Amount: 11/28/15, two cans of beer  Sleep: fair   Appetite:  Improved   Current Medications: Current Facility-Administered Medications  Medication Dose Route Frequency Provider Last Rate Last Dose  . acetaminophen (TYLENOL) tablet 650 mg  650 mg Oral Q6H PRN Kerry Hough, PA-C   650 mg at 12/04/15 1442  . alum & mag hydroxide-simeth (MAALOX/MYLANTA) 200-200-20 MG/5ML suspension 30 mL  30 mL Oral Q4H PRN Kerry Hough, PA-C      . busPIRone (BUSPAR) tablet 5 mg  5 mg Oral TID Oneta Rack, NP      . citalopram (CELEXA) tablet 20 mg  20 mg Oral Daily Craige Cotta, MD   20 mg at 12/05/15 0802  . [START ON 12/06/2015] cloNIDine (CATAPRES) tablet 0.1 mg  0.1 mg Oral QAC breakfast Kerry Hough, PA-C      . dicyclomine (BENTYL) tablet 20 mg  20 mg Oral Q6H PRN Kerry Hough, PA-C   20 mg at 12/01/15 0837  . loperamide (IMODIUM) capsule 2-4 mg  2-4 mg Oral PRN Kerry Hough, PA-C   4 mg at 12/03/15 0800  . LORazepam (ATIVAN) tablet 0.5 mg  0.5 mg Oral Q6H PRN Craige Cotta, MD   0.5 mg at 12/05/15 1517  . magnesium hydroxide (MILK OF MAGNESIA) suspension 30 mL  30 mL Oral Daily PRN Kerry Hough, PA-C      . methocarbamol (ROBAXIN) tablet 500 mg  500 mg Oral Q8H PRN Kerry Hough, PA-C   500 mg at 12/04/15 2107  . naproxen (NAPROSYN) tablet 500 mg  500 mg Oral BID PRN Kerry Hough, PA-C   500 mg at 12/04/15 2107  . nicotine (NICODERM CQ - dosed in mg/24 hours) patch 21 mg  21 mg Transdermal Q0600 Kerry Hough, PA-C   21 mg at 12/05/15 0802  . ondansetron (ZOFRAN-ODT) disintegrating tablet 4 mg  4 mg Oral Q6H PRN Kerry Hough, PA-C   4 mg at 12/03/15 0758  . traZODone (DESYREL) tablet 50 mg  50 mg Oral QHS PRN Craige Cotta, MD   50 mg at 12/04/15 2107    Lab Results:  No results found for this or any previous visit (from the past 48 hour(s)).  Blood Alcohol level:  Lab  Results  Component Value Date   ETH <5 08/23/2014    Metabolic Disorder Labs: Lab Results  Component Value Date   HGBA1C 5.0 12/01/2015   MPG 97 12/01/2015   No results found for: PROLACTIN Lab Results  Component Value Date   CHOL 131 12/01/2015   TRIG 118 12/01/2015   HDL 47 12/01/2015   CHOLHDL 2.8 12/01/2015   VLDL 24 12/01/2015   LDLCALC 60 12/01/2015   Physical Findings: AIMS: Facial and Oral Movements Muscles of Facial Expression: None, normal Lips and Perioral  Area: None, normal Jaw: None, normal Tongue: None, normal,Extremity Movements Upper (arms, wrists, hands, fingers): None, normal Lower (legs, knees, ankles, toes): None, normal, Trunk Movements Neck, shoulders, hips: None, normal, Overall Severity Severity of abnormal movements (highest score from questions above): None, normal Incapacitation due to abnormal movements: None, normal Patient's awareness of abnormal movements (rate only patient's report): No Awareness, Dental Status Current problems with teeth and/or dentures?: No Does patient usually wear dentures?: No  CIWA:  CIWA-Ar Total: 0 COWS:  COWS Total Score: 10  Musculoskeletal: Strength & Muscle Tone: within normal limits Gait & Station: normal Patient leans: N/A  Psychiatric Specialty Exam: Physical Exam  Nursing note and vitals reviewed. Constitutional: He is oriented to person, place, and time. He appears well-developed.  HENT:  Head: Normocephalic.  Musculoskeletal: Normal range of motion.  Neurological: He is alert and oriented to person, place, and time.  Psychiatric: He has a normal mood and affect. His behavior is normal.    Review of Systems  Psychiatric/Behavioral: Positive for depression. Negative for suicidal ideas and hallucinations. The patient is nervous/anxious.   All other systems reviewed and are negative.  Denies headache at this time, no chest pain or shortness of breath ,  denies nausea, no vomiting today, diarrhea has  subsided    Blood pressure 118/67, pulse 82, temperature 97.5 F (36.4 C), temperature source Oral, resp. rate 24, height 6' 0.75" (1.848 m), weight 115.667 kg (255 lb).Body mass index is 33.87 kg/(m^2).  General Appearance: improved grooming   Eye Contact:  Good  Speech:  Normal Rate  Volume:  Decreased  Mood:  Less depressed, improved range of affect   Affect:  Still anxious  Thought Process:  Linear  Orientation:  Full (Time, Place, and Person)  Thought Content:  denies hallucinations, no delusions   Suicidal Thoughts:  No denies current suicidal plan or intention and contracts for safety on the unit, denies any homicidal ideations   Homicidal Thoughts:  No  Memory:  recent and remote grossly intact   Judgement:  Improving   Insight:  Present  Psychomotor Activity:  Normal-  Concentration:  Concentration: Good and Attention Span: Good  Recall:  Good  Fund of Knowledge:  Good  Language:  Good  Akathisia:  Negative  Handed:  Right  AIMS (if indicated):     Assets:  Desire for Improvement Resilience  ADL's:  Intact  Cognition:  WNL  Sleep:  Number of Hours: 4.5    I agree with current treatment plan on 12/05/2015, Patient seen face-to-face for psychiatric evaluation follow-up, chart reviewed. Reviewed the information documented and agree with the treatment plan. Patient is requesting vivitrol for opiates addiction.  Treatment Plan Summary: Daily contact with patient to assess and evaluate symptoms and progress in treatment, Medication management, Plan inpatient admission  and medications as below   Encourage group and milieu participation to work on coping skills and symptom reduction  Continue to encourage sobriety, relapse prevention efforts Continue Clonidine taper to minimize symptoms of opiate WDL  Continue Ativan 0.5 mgrs Q 6 hours PRN for severe anxiety, as needed  Increase  Celexa to 20  mgrs QDAY for depression and anxiety Start Buspar 5 mg PO TID for anxiety.  Start  Vistaril  25 mg PO PRN TID for anxiety D/C Seroquel  Based on above  Start Trazodone 50 mgrs QHS for insomnia - side effects discussed  Treatment team working on disposition planning as above- currently interested in going to [facility] after  discharge.   Oneta Rack, NP , 12/05/2015, 3:53 PM  Reviewed the information documented and agree with the treatment plan.  [name] 12/06/2015 11:48 AM

## 2015-12-05 NOTE — Plan of Care (Signed)
Problem: Coping: Goal: Ability to identify and develop effective coping behavior will improve Outcome: Progressing Pt able to verbalize to coping mechanisms for discharge

## 2015-12-05 NOTE — [facility] Group Notes (Signed)
[facility] Group Notes: (Clinical Social Work)   12/05/2015      Type of Therapy:  Group Therapy   Participation Level:  Did Not Attend despite MHT prompting   Jobina Maita Grossman-Orr, LCSW 12/05/2015, 12:56 PM     

## 2015-12-05 NOTE — Progress Notes (Signed)
D: Pt is pleasant and smiling on approach. Reports feeling better. Interacts appropriately with staff and peers. Med and group compliant. Pt did report feeling anxious this am. PRN given with good relief. A: Encouragement and support offered. Pt receptive. R: Remains safe on unit with q 15 min checks.

## 2015-12-06 NOTE — [facility] Group Notes (Signed)
[facility] Group Notes:  (Clinical Social Work)  12/06/2015  10:00-11:00AM  Summary of Progress/Problems:   The main focus of today's process group was to   1)  identify the patient's current unhealthy supports and plan how to handle them  2)  Identify the patient's current healthy supports and plan what to add.   An emphasis was placed on using counselor, doctor, therapy groups, 12-step groups, and problem-specific support groups to expand supports.    The patient expressed full comprehension of the concepts presented, and agreed that there is a need to add more supports.  The patient stated family and friends are healthy supports for him while he himself is an unhealthy support.   Type of Therapy:  Process Group with Motivational Interviewing  Participation Level:  Active  Participation Quality:  Attentive and Sharing  Affect:  Blunted  Cognitive:  Appropriate  Insight:  Developing/Improving  Engagement in Therapy:  Engaged  Modes of Intervention:   Education, Support and Processing, Activity  Ambrose Mantle, LCSW 12/06/2015

## 2015-12-06 NOTE — Plan of Care (Signed)
Problem: Coping: Goal: Ability to cope will improve Outcome: Progressing Nurse discussed depression, coping skills with patient.

## 2015-12-06 NOTE — Progress Notes (Signed)
D:  Patient's self inventory sheet, patient sleeps good, sleep medication given.  Good appetite, normal energy level, good concentration.  Denied depression and hopeless, rated anxiety 6.5.  Denied withdrawals, does have tremors.  Denied SI.  Denied physical problems.  Denied pain.  Goal is to sleep.  Plans to stay in bed.  Does have discharge plans. A:  Medications administered per MD orders.  Emotional support and encouragement given patient. R:  Denied SI and HI, contracts for safety.  Denied A/V hallucinations.  Safety maintained with 15 minute checks.

## 2015-12-06 NOTE — Progress Notes (Signed)
[facility] MD Progress Note  12/06/2015 3:54 PM [name]  MRN:  [id number]  Subjective: Patient reports " Do you know what time I will leave for my treatment program on Monday?" -patient is still requesting vivtrol before he discharge.  Objective : [name] is awake, alert and oriented X4. Seen resting in dayroom interacting with peers.  Denies suicidal or homicidal ideation. Denies auditory or visual hallucination and does not appear to be responding to internal stimuli. Patient reports increased anxiety. States that he is hopeful that the Buspar will help with his anxiety. Patient reports he is medication compliant without mediation side effects. Patient is denies depression or depressive symptoms at this time. Reports good appetite  and resting well. Patient report he feels ready to discharge and is eager to go to the [employer]. Patient reports this is going to be a new start. Support, encouragement and reassurance was provided.   Principal Problem: Polysubstance (including opioids) dependence with physiol dependence (HCC) Diagnosis:   Patient Active Problem List   Diagnosis Date Noted  . Substance induced mood disorder (HCC) [F19.94]   . Atypical depression (HCC) [F32.89]   . Polysubstance (including opioids) dependence with physiol dependence (HCC) [F19.20] 08/23/2014  . Sigmoid diverticulitis s/p lap assisted sigmoid colectomy 07/01/14 [K57.32] 07/01/2014  . Diverticulitis [K57.92] 12/06/2013  . Diverticulitis of large intestine without perforation or abscess without bleeding [K57.32] 12/06/2013  . Obesity, unspecified [E66.9] 12/06/2013  . Acute diverticulitis [K57.92] 12/06/2013  . NAUSEA AND VOMITING [R11.2] 02/09/2009  . OTHER SYMPTOMS INVOLVING DIGESTIVE SYSTEM OTHER [R19.8] 02/09/2009   Total Time spent with patient:  20 minutes   Past Medical History:  Past Medical History:  Diagnosis Date  . Anxiety   . Attention deficit disorder   . Depression   .  Diverticulitis   . Hiccups    pt states hiccups during sleep     Past Surgical History:  Procedure Laterality Date  . colonscopy      removed polyps   . LAPAROSCOPIC PARTIAL COLECTOMY N/A 07/01/2014   Procedure: LAPAROSCOPIC ASSISTED SIGMOID COLECTOMY WITH MOBILIZATION OF SPLENIC FLEXURE;  Surgeon: Avel Peace, MD;  Location: [facility];  Service: General;  Laterality: N/A;   Family History: History reviewed. No pertinent family history.  Social History:  History  Alcohol Use  . Yes    Comment: occasionally     History  Drug Use No    Comment: clean for 9 months--     Social History   Social History  . Marital status: Single    Spouse name: N/A  . Number of children: N/A  . Years of education: N/A   Social History Main Topics  . Smoking status: Never Smoker  . Smokeless tobacco: Current User    Types: Chew  . Alcohol use Yes     Comment: occasionally  . Drug use: No     Comment: clean for 9 months--   . Sexual activity: Yes    Birth control/ protection: Condom   Other Topics Concern  . None   Social History Narrative  . None   Additional Social History:    Pain Medications: Denies abuse Prescriptions: None Over the Counter: Denies abuse History of alcohol / drug use?: Yes Negative Consequences of Use: Financial, Personal relationships, Work / School Withdrawal Symptoms: Fever / Chills, Irritability, Agitation, Tingling, Sweats, Cramps Name of Substance 1: Heroin (IV) 1 - Age of First Use: 20 1 - Amount (size/oz): 2 grams 1 - Frequency: Daily 1 -  Duration: Four years 1 - Last Use / Amount: 11/30/15, 1.5 grams Name of Substance 2: Cocaine (IV) 2 - Age of First Use: 20 2 - Amount (size/oz): 0.5-1 gram 2 - Frequency: 2-3 times per week 2 - Duration: Four years 2 - Last Use / Amount: 11/28/15, 1 gram Name of Substance 3: Meth 3 - Age of First Use: 22 3 - Amount (size/oz): Approximately $40 worth 3 - Frequency: 3-4 time per month 3 - Duration: One  year 3 - Last Use / Amount: 11/28/15, $40 worth Name of Substance 4: Alcohol 4 - Age of First Use: 15 4 - Amount (size/oz): Six cans of beer 4 - Frequency: 1-2 times per month 4 - Duration: Ongoing 4 - Last Use / Amount: 11/28/15, two cans of beer  Sleep: fair   Appetite:  Improved   Current Medications: Current Facility-Administered Medications  Medication Dose Route Frequency Provider Last Rate Last Dose  . acetaminophen (TYLENOL) tablet 650 mg  650 mg Oral Q6H PRN Kerry Hough, PA-C   650 mg at 12/04/15 1442  . alum & mag hydroxide-simeth (MAALOX/MYLANTA) 200-200-20 MG/5ML suspension 30 mL  30 mL Oral Q4H PRN Kerry Hough, PA-C      . busPIRone (BUSPAR) tablet 5 mg  5 mg Oral TID Oneta Rack, NP   5 mg at 12/06/15 1149  . citalopram (CELEXA) tablet 20 mg  20 mg Oral Daily Craige Cotta, MD   20 mg at 12/06/15 0824  . cloNIDine (CATAPRES) tablet 0.1 mg  0.1 mg Oral QAC breakfast Kerry Hough, PA-C   0.1 mg at 12/06/15 7829  . hydrOXYzine (ATARAX/VISTARIL) tablet 25 mg  25 mg Oral Q4H PRN Oneta Rack, NP   25 mg at 12/06/15 1357  . LORazepam (ATIVAN) tablet 0.5 mg  0.5 mg Oral Q6H PRN Craige Cotta, MD   0.5 mg at 12/06/15 0826  . magnesium hydroxide (MILK OF MAGNESIA) suspension 30 mL  30 mL Oral Daily PRN Kerry Hough, PA-C      . nicotine (NICODERM CQ - dosed in mg/24 hours) patch 21 mg  21 mg Transdermal Q0600 Kerry Hough, PA-C   21 mg at 12/06/15 0825  . traZODone (DESYREL) tablet 50 mg  50 mg Oral QHS PRN Craige Cotta, MD   50 mg at 12/05/15 2147    Lab Results:  No results found for this or any previous visit (from the past 48 hour(s)).  Blood Alcohol level:  Lab Results  Component Value Date   ETH <5 08/23/2014    Metabolic Disorder Labs: Lab Results  Component Value Date   HGBA1C 5.0 12/01/2015   MPG 97 12/01/2015   No results found for: PROLACTIN Lab Results  Component Value Date   CHOL 131 12/01/2015   TRIG 118 12/01/2015    HDL 47 12/01/2015   CHOLHDL 2.8 12/01/2015   VLDL 24 12/01/2015   LDLCALC 60 12/01/2015   Physical Findings: AIMS: Facial and Oral Movements Muscles of Facial Expression: None, normal Lips and Perioral Area: None, normal Jaw: None, normal Tongue: None, normal,Extremity Movements Upper (arms, wrists, hands, fingers): None, normal Lower (legs, knees, ankles, toes): None, normal, Trunk Movements Neck, shoulders, hips: None, normal, Overall Severity Severity of abnormal movements (highest score from questions above): None, normal Incapacitation due to abnormal movements: None, normal Patient's awareness of abnormal movements (rate only patient's report): No Awareness, Dental Status Current problems with teeth and/or dentures?: No Does patient usually wear  dentures?: No  CIWA:  CIWA-Ar Total: 1 COWS:  COWS Total Score: 1  Musculoskeletal: Strength & Muscle Tone: within normal limits Gait & Station: normal Patient leans: N/A  Psychiatric Specialty Exam: Physical Exam  Nursing note and vitals reviewed. Constitutional: He is oriented to person, place, and time. He appears well-developed.  HENT:  Head: Normocephalic.  Musculoskeletal: Normal range of motion.  Neurological: He is alert and oriented to person, place, and time.  Psychiatric: He has a normal mood and affect. His behavior is normal.    Review of Systems  Psychiatric/Behavioral: Positive for substance abuse. Negative for depression, hallucinations and suicidal ideas. The patient is nervous/anxious. The patient does not have insomnia.   All other systems reviewed and are negative.  Denies headache at this time, no chest pain or shortness of breath ,  denies nausea, no vomiting today, diarrhea has subsided    Blood pressure 120/70, pulse 79, temperature 97.7 F (36.5 C), temperature source Oral, resp. rate 16, height 6' 0.75" (1.848 m), weight 115.7 kg (255 lb).Body mass index is 33.87 kg/m.  General Appearance: Causal    Eye Contact:  Good  Speech:  Normal Rate  Volume:  Normal  Mood:  Congruent  Affect:  Still anxious  Thought Process:  Linear  Orientation:  Full (Time, Place, and Person)  Thought Content:  denies hallucinations, no delusions   Suicidal Thoughts:  No denies current suicidal plan or intention and contracts for safety on the unit, denies any homicidal ideations   Homicidal Thoughts:  No  Memory:  recent and remote grossly intact   Judgement:  Improving   Insight:  Present  Psychomotor Activity:  Normal-  Concentration:  Concentration: Good and Attention Span: Good  Recall:  Good  Fund of Knowledge:  Good  Language:  Good  Akathisia:  Negative  Handed:  Right  AIMS (if indicated):     Assets:  Desire for Improvement Resilience  ADL's:  Intact  Cognition:  WNL  Sleep:  Number of Hours: 5.5    I agree with current treatment plan on 12/06/2015, Patient seen face-to-face for psychiatric evaluation follow-up, chart reviewed. Reviewed the information documented and agree with the treatment plan. -Patient is requesting vivitrol for opiates addiction/ before his discharge.  Treatment Plan Summary: Daily contact with patient to assess and evaluate symptoms and progress in treatment, Medication management, Plan inpatient admission  and medications as below   Encourage group and milieu participation to work on coping skills and symptom reduction  Continue to encourage sobriety, relapse prevention efforts Continue Clonidine taper to minimize symptoms of opiate WDL  Continue Ativan 0.5 mgrs Q 6 hours PRN for severe anxiety, as needed  Continue  Celexa to 20  mgrs QDAY for depression and anxiety Start Buspar 5 mg PO TID for anxiety.  Start Vistaril  25 mg PO PRN TID for anxiety D/C Seroquel  Based on above  Start Trazodone 50 mgrs QHS for insomnia - side effects discussed  Treatment team working on disposition planning as above- currently interested in going to [facility] after  discharge.  Oneta Rack, NP , 12/06/2015, 3:54 PM  Reviewed the information documented and agree with the treatment plan.  [name] 12/07/2015 11:15 AM

## 2015-12-06 NOTE — [facility] Group Notes (Signed)
The focus of this group is to educate the patient on the purpose and policies of crisis stabilization and provide a format to answer questions about their admission.  The group details unit policies and expectations of patients while admitted.  Patient did not attend 0900 nurse education orientation this morning.  Patient stayed in bed.  

## 2015-12-06 NOTE — Progress Notes (Signed)
Patient did attend the evening speaker AA meeting.  

## 2015-12-06 NOTE — Progress Notes (Signed)
Patient did not attend the evening speaker AA meeting. Pt was notified that group was beginning and declined. Pt returned to his room to shower.

## 2015-12-07 MED ORDER — TRAZODONE HCL 50 MG PO TABS: 50.0000 mg | ORAL_TABLET | Freq: Every evening | ORAL | 0 refills | Status: DC | PRN

## 2015-12-07 MED ORDER — BUSPIRONE HCL 5 MG PO TABS: 5.0000 mg | ORAL_TABLET | Freq: Three times a day (TID) | ORAL | 0 refills | Status: DC

## 2015-12-07 MED ORDER — CITALOPRAM HYDROBROMIDE 20 MG PO TABS: 20.0000 mg | ORAL_TABLET | Freq: Every day | ORAL | 0 refills | Status: DC

## 2015-12-07 NOTE — Progress Notes (Addendum)
  [facility] Adult Case Management Discharge Plan :  Will you be returning to the same living situation after discharge:  No. [facility] At discharge, do you have transportation home?: Yes,  friend Do you have the ability to pay for your medications: Yes,  mental health  Release of information consent forms completed and submitted to medical records by CSW.   Patient to Follow up at: Follow-up Information    [facility] Follow up on 12/07/2015.   Why:  accepted for Monday. Please arrive as soon as possible on this date.  Contact information: [address] [place], [place] [address] Phone: [phone]                [facility] .   Why:  Walk in between 8am-3pm for assessment of mental health services including: Medication Management/counseling/substance abuse treatment.  Contact information: [address] [place], [place] [address] Phone: [phone] Fax: [phone]          Next level of care provider has access to [facility] Link:no  Safety Planning and Suicide Prevention discussed: Yes,  SPE completed with pt; pt declined to consent to family contact. SPI pamphlet, mobile crisis information provided to pt and he was encouraged to share information with support network.   Have you used any form of tobacco in the last 30 days? (Cigarettes, Smokeless Tobacco, Cigars, and/or Pipes): Yes  Has patient been referred to the Quitline?: Patient refused referral  Patient has been referred for addiction treatment: Yes  Smart, Braileigh Landenberger LCSW 12/07/2015, 9:42 AM

## 2015-12-07 NOTE — Tx Team (Signed)
Interdisciplinary Treatment Plan Update (Adult)  Date:  12/07/2015  Time Reviewed:  9:44 AM   Progress in Treatment: Attending groups: Yes Participating in groups:  Yes Taking medication as prescribed:  Yes. Tolerating medication:  Yes. Family/Significant othe contact made:  SPE completed with pt; pt declined to consent to family contact.  Patient understands diagnosis:  Yes. and As evidenced by:  seeking treatment for heroin abuse, cocaine abuse, methamphetamine abuse, alcohol abuse, depression, SI, and for medication stabilization. Discussing patient identified problems/goals with staff:  Yes. Medical problems stabilized or resolved:  Yes. Denies suicidal/homicidal ideation: Yes. Issues/concerns per patient self-inventory:  Other:   Discharge Plan or Barriers: Pt accepted to [facility] for today. [facility] for mental health services in [place].   Reason for Continuation of Hospitalization: None  Comments:  Montravious Weigelt is an 25 y.o. single male who presents to [facility] accompanied by his parents, who did not participate in assessment. Pt reports he is engaging in heavy drug use, is unable to stop and is having suicidal ideation with thoughts of wrecking his car or shooting himself with a gun. Pt reports he has access to a gun at his home. Pt report he has been using substances regularly since age 33. He is currently using IV heroin, intravenous cocaine, methamphetamine and alcohol. Pt denies any history of suicide attempts. He denies any history of intentional self-injurious behavior. Pt denies homicidal ideation or history of violence. He denies any history of psychotic symptoms.Pt identifies consequences of his substance use and need to acquire drugs daily as his primary stressor. He states he is seeking treatment at this time because his life has become unmanageable and he recognizes he cannot quit without treatment. Pt reports he lives with his parents due to his substance  use. 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 Pt received inpatient dual-diagnosis treatment at [facility] 08/23/14-14/14/16 and was diagnosed with a mood disorder and substance use disorders. He then went to [facility]. Pt denies current outpatient providers.Diagnosis: Substance-induced Mood Disorder; Opioid Use Disorder, Severe; Cocaine Use Disorder, Severe; Methamphetamine Use Disorder, Severe; Alcohol Use Disorder, Mild  Estimated length of stay:  D/c today   Review of initial/current patient goals per problem list:  1. Goal(s): Patient will participate in aftercare plan  Met: Yes  Target date: at discharge  As evidenced by: Patient will participate within aftercare plan AEB aftercare provider and housing plan at discharge being identified.  7/18: CSW assessing for appropriate referrals.   7/21: Pt reports that he will discharge to [facility] and [facility] for outpatient mental health services.   2. Goal (s): Patient will exhibit decreased depressive symptoms and suicidal ideations.  Met:Yes   Target date: at discharge  As evidenced by: Patient will utilize self rating of depression at 3 or below and demonstrate decreased signs of depression or be deemed stable for discharge by MD.  7/18: Pt rates depression as high. Passive SI at times. No plan/intent. Pt able to contract for safety on the unit.   7/21: Pt rates depression as 2/10 and presents with pleasant mood/calm affect. He denies SI/HI/AVH. Pt pleasant and upbeat on the unit today.   3. Goal(s): Patient will demonstrate decreased signs of withdrawal due to substance abuse  Met: Yes   Target date:at discharge   As evidenced by: Patient will produce a CIWA/COWS score of 0, have stable vitals signs, and no symptoms of withdrawal.  7/18: Pt reports mild withdrawals with COWS of 3 and low standing/sitting BP.   7/21: Pt reports no signs of withdrawal. Latest COWS of 2  and stable vitals. Per MD, pt is medically stable for discharge on Monday.   Attendees: Patient:   12/07/2015 9:44 AM   Family:   12/07/2015 9:44 AM   Physician:  Dr. Parke Poisson MD 12/07/2015 9:44 AM   Nursing:   Merlene Morse RN 12/07/2015 9:44 AM   Clinical Social Worker: Maxie Better, LCSW 12/07/2015 9:44 AM   Clinical Social Worker:Lauren Madie Reno 12/07/2015 9:44 AM   Other:  Gerline Legacy Nurse Case Manager 12/07/2015 9:44 AM   Other:  Agustina Caroli NP; Ricky Ala NP 12/07/2015 9:44 AM   Other:   12/07/2015 9:44 AM   Other:  12/07/2015 9:44 AM   Other:  12/07/2015 9:44 AM   Other:  12/07/2015 9:44 AM    12/07/2015 9:44 AM    12/07/2015 9:44 AM    12/07/2015 9:44 AM    12/07/2015 9:44 AM    Scribe for Treatment Team:   Maxie Better, LCSW 12/07/2015 9:44 AM

## 2015-12-07 NOTE — Discharge Summary (Signed)
Physician Discharge Summary Note  Patient:  Euclid Cassetta is an 25 y.o., male MRN:  [id number] DOB:  June 21, 1990 Patient phone:  [phone] (home)  Patient address:   [address]  [place] [address],  Total Time spent with patient: 30 minutes  Date of Admission:  11/30/2015 Date of Discharge: 12/07/2015    Reason for Admission:Per H&P- This is an admission assessment for Landon a 25 year old Caucasian male. Admitted to the [facility] adult unit with as a walk-in with complaints of heavy drug use (Heroin & Cocaine). However, he states that his heroin use has escalated that he does not feel like living any more. He expressed the desire to end it all by either wrecking his car on purpose with him in it or shoot himself as he can access a gun very easily.  During this assessment, Raekwon reports, "I have been using drugs heavily on daily basis for 6 years, non-stop. It has worsened tremendously because that is all I do on daily basis as I have no job or anything meaningful to do with life.  I use primarily Heroin intravenously. I also use Crack Cocaine. I used last, yesterday around 3:00 pm. I use about 2 gram daily for 2 years. I need to stop, but I need a lot of help & support to do it. I cannot say I'm depressed, but I'm very unhappy for what my life has become. I have let everyone that cared about me down. I also drink alcohol, just socially about 6 packs of beer on weekly basis. I dip tobacco. I contracted Hepatitis C from IV drug use. I suffer from diverticulitis & have had part of my colon removed. My longest sobriety is only 4 weeks & that was a while ago. I will need to go into an inpatient substance abuse treatment center after discharge from here".   Principal Problem: Polysubstance (including opioids) dependence with physiol dependence [facility]) Discharge Diagnoses: Patient Active Problem List   Diagnosis Date Noted  . Substance induced mood disorder (HCC) [F19.94]   . Atypical depression  (HCC) [F32.89]   . Polysubstance (including opioids) dependence with physiol dependence (HCC) [F19.20] 08/23/2014  . Sigmoid diverticulitis s/p lap assisted sigmoid colectomy 07/01/14 [K57.32] 07/01/2014  . Diverticulitis [K57.92] 12/06/2013  . Diverticulitis of large intestine without perforation or abscess without bleeding [K57.32] 12/06/2013  . Obesity, unspecified [E66.9] 12/06/2013  . Acute diverticulitis [K57.92] 12/06/2013  . NAUSEA AND VOMITING [R11.2] 02/09/2009  . OTHER SYMPTOMS INVOLVING DIGESTIVE SYSTEM OTHER [R19.8] 02/09/2009    Past Psychiatric History: See Above  Past Medical History:  Past Medical History:  Diagnosis Date  . Anxiety   . Attention deficit disorder   . Depression   . Diverticulitis   . Hiccups    pt states hiccups during sleep     Past Surgical History:  Procedure Laterality Date  . colonscopy      removed polyps   . LAPAROSCOPIC PARTIAL COLECTOMY N/A 07/01/2014   Procedure: LAPAROSCOPIC ASSISTED SIGMOID COLECTOMY WITH MOBILIZATION OF SPLENIC FLEXURE;  Surgeon: Avel Peace, MD;  Location: [facility];  Service: General;  Laterality: N/A;   Family History: History reviewed. No pertinent family history. Family Psychiatric  History: See H&P Social History:  History  Alcohol Use  . Yes    Comment: occasionally     History  Drug Use No    Comment: clean for 9 months--     Social History   Social History  . Marital status: Single  Spouse name: N/A  . Number of children: N/A  . Years of education: N/A   Social History Main Topics  . Smoking status: Never Smoker  . Smokeless tobacco: Current User    Types: Chew  . Alcohol use Yes     Comment: occasionally  . Drug use: No     Comment: clean for 9 months--   . Sexual activity: Yes    Birth control/ protection: Condom   Other Topics Concern  . None   Social History Narrative  . None    Hospital Course:  Chaynce Schafer was admitted for Polysubstance (including opioids) dependence  with physiol dependence (HCC) , with psychosis  Pt was treated discharged with the medications listed below under Medication List.  Medical problems were identified and treated as needed.  Home medications were restarted as appropriate.  Improvement was monitored by observation and Benetta Spar 's daily report of symptom reduction.  Emotional and mental status was monitored by daily self-inventory reports completed by Benetta Spar and clinical staff.         Marks Scalera was evaluated by the treatment team for stability and plans for continued recovery upon discharge. Alphus Zeck 's motivation was an integral factor for scheduling further treatment. Employment, transportation, bed availability, health status, family support, and any pending legal issues were also considered during hospital stay. Pt was offered further treatment options upon discharge including but not limited to Residential, Intensive Outpatient, and Outpatient treatment.  Erhard Senske will follow up with the services as listed below under Follow Up Information.      Upon completion of this admission the patient was both mentally and medically stable for discharge denying suicidal/homicidal ideation, auditory/visual/tactile hallucinations, delusional thoughts and paranoia. Patient to follow-up with [facility] and [facility]. Samples was provided at discharge.  Benetta Spar responded well to treatment with Buspar 5 mg PO TID, Trazodone 50 mg Po QHS and Clonidine taper protocol without adverse effects.Pt demonstrated improvement without reported or observed adverse effects to the point of stability appropriate for outpatient management. Pertinent labs include: Results pending for CBC, CMP and A1C for which outpatient follow-up is necessary for lab recheck as mentioned below. Reviewed CBC, CMP, BAL, and UDS+ Opiates and Cocaine; all unremarkable aside from noted exceptions.   Physical Findings: AIMS: Facial and Oral  Movements Muscles of Facial Expression: None, normal Lips and Perioral Area: None, normal Jaw: None, normal Tongue: None, normal,Extremity Movements Upper (arms, wrists, hands, fingers): None, normal Lower (legs, knees, ankles, toes): None, normal, Trunk Movements Neck, shoulders, hips: None, normal, Overall Severity Severity of abnormal movements (highest score from questions above): None, normal Incapacitation due to abnormal movements: None, normal Patient's awareness of abnormal movements (rate only patient's report): No Awareness, Dental Status Current problems with teeth and/or dentures?: No Does patient usually wear dentures?: No  CIWA:  CIWA-Ar Total: 1 COWS:  COWS Total Score: 2  Musculoskeletal: Strength & Muscle Tone: within normal limits Gait & Station: normal Patient leans: N/A  Psychiatric Specialty Exam: See SRA by MD Physical Exam  Nursing note and vitals reviewed. Constitutional: He is oriented to person, place, and time.  Musculoskeletal: Normal range of motion.  Neurological: He is alert and oriented to person, place, and time.  Psychiatric: He has a normal mood and affect. His behavior is normal.    Review of Systems  Psychiatric/Behavioral: Negative for hallucinations and suicidal ideas. Depression: stable. Nervous/anxious: stable.     Blood pressure 126/89, pulse 92, temperature 97.7 F (  36.5 C), resp. rate 20, height 6' 0.75" (1.848 m), weight 115.7 kg (255 lb).Body mass index is 33.87 kg/m.    Have you used any form of tobacco in the last 30 days? (Cigarettes, Smokeless Tobacco, Cigars, and/or Pipes): Yes  Has this patient used any form of tobacco in the last 30 days? (Cigarettes, Smokeless Tobacco, Cigars, and/or Pipes)  No  Blood Alcohol level:  Lab Results  Component Value Date   ETH <5 08/23/2014    Metabolic Disorder Labs:  Lab Results  Component Value Date   HGBA1C 5.0 12/01/2015   MPG 97 12/01/2015   No results found for:  PROLACTIN Lab Results  Component Value Date   CHOL 131 12/01/2015   TRIG 118 12/01/2015   HDL 47 12/01/2015   CHOLHDL 2.8 12/01/2015   VLDL 24 12/01/2015   LDLCALC 60 12/01/2015    See Psychiatric Specialty Exam and Suicide Risk Assessment completed by Attending Physician prior to discharge.  Discharge destination:  Home  Is patient on multiple antipsychotic therapies at discharge:  No   Has Patient had three or more failed trials of antipsychotic monotherapy by history:  No  Recommended Plan for Multiple Antipsychotic Therapies: NA  Discharge Instructions    Activity as tolerated - No restrictions    Complete by:  As directed   Diet general    Complete by:  As directed   Discharge instructions    Complete by:  As directed   Take all medications as prescribed. Keep all follow-up appointments as scheduled.  Do not consume alcohol or use illegal drugs while on prescription medications. Report any adverse effects from your medications to your primary care provider promptly.  In the event of recurrent symptoms or worsening symptoms, call 911, a crisis hotline, or go to the nearest emergency department for evaluation.       Medication List    STOP taking these medications   cloNIDine 0.1 MG tablet Commonly known as:  CATAPRES   hydrOXYzine 25 MG tablet Commonly known as:  ATARAX/VISTARIL   methocarbamol 500 MG tablet Commonly known as:  ROBAXIN   naproxen 500 MG tablet Commonly known as:  NAPROSYN     TAKE these medications     Indication  busPIRone 5 MG tablet Commonly known as:  BUSPAR Take 1 tablet (5 mg total) by mouth 3 (three) times daily.  Indication:  Anxiety Disorder   citalopram 20 MG tablet Commonly known as:  CELEXA Take 1 tablet (20 mg total) by mouth daily.  Indication:  Depression   traZODone 50 MG tablet Commonly known as:  DESYREL Take 1 tablet (50 mg total) by mouth at bedtime as needed for sleep. What changed:  medication strength  how  much to take  Indication:  Trouble Sleeping      Follow-up Information    [facility] Follow up on 12/07/2015.   Why:  accepted for Monday. Please arrive as soon as possible on this date.  Contact information: [address] [place], [place] [address] Phone: [phone]        [facility] .   Why:  Walk in between 8am-3pm for assessment of mental health services including: Medication Management/counseling/substance abuse treatment.  Contact information: [address] [place], [place] [address] Phone: [phone] Fax: [phone]          Follow-up recommendations:  Activity:  as tolerated  Diet:  heart healthy  Comments:  Take all medications as prescribed. Keep all follow-up appointments as scheduled.  Do  not consume alcohol or use illegal drugs while on prescription medications. Report any adverse effects from your medications to your primary care provider promptly.  In the event of recurrent symptoms or worsening symptoms, call 911, a crisis hotline, or go to the nearest emergency department for evaluation.   Signed: Oneta Rack, NP 12/07/2015, 12:30 PM   Patient seen, Suicide Assessment Completed.  Disposition Plan Reviewed

## 2015-12-07 NOTE — Progress Notes (Signed)
D:  Patient's self inventory sheet, patient sleeps good, sleep medication given.   Good appetite, normal energy level, good concentration.  Denied depression and hopeless, anxiety 3.5.  Denied withdrawals.  Denied SI.  Denied physical problems.  Denied pain.  Goal is discharge. Does have discharge plans. A:  Medications administered per MD orders.  Emotional support and encouragement given patient. R:  Denied SI and HI, contracts for safety.  Denied A/V hallucinations.  Safety maintained with 15 minute checks.

## 2015-12-07 NOTE — Progress Notes (Signed)
Discharge Note:  Patient discharged home with family members.  Patient will be going to treatment center.  Patient denied SI and HI.  Denied A/V hallucinations.  Patient stated he received all his belongings, clothing, toiletries, misc items, prescriptions, bag, cell phone and charger, keys, switch blade, blanket, tobacco, duffel bag, etc.  Suicide prevention information given and discussed with patient who stated he understood and had no questions.  Patient stated he appreciated all assistance received from [facility] staff.  All required discharge information given to patient at discharge.

## 2015-12-07 NOTE — [facility] Suicide Risk Assessment (Signed)
[facility] Discharge Suicide Risk Assessment   Principal Problem: Polysubstance (including opioids) dependence with physiol dependence [facility]) Discharge Diagnoses:  Patient Active Problem List   Diagnosis Date Noted  . Substance induced mood disorder (HCC) [F19.94]   . Atypical depression (HCC) [F32.89]   . Polysubstance (including opioids) dependence with physiol dependence (HCC) [F19.20] 08/23/2014  . Sigmoid diverticulitis s/p lap assisted sigmoid colectomy 07/01/14 [K57.32] 07/01/2014  . Diverticulitis [K57.92] 12/06/2013  . Diverticulitis of large intestine without perforation or abscess without bleeding [K57.32] 12/06/2013  . Obesity, unspecified [E66.9] 12/06/2013  . Acute diverticulitis [K57.92] 12/06/2013  . NAUSEA AND VOMITING [R11.2] 02/09/2009  . OTHER SYMPTOMS INVOLVING DIGESTIVE SYSTEM OTHER [R19.8] 02/09/2009    Total Time spent with patient: 30 minutes  Musculoskeletal: Strength & Muscle Tone: within normal limits Gait & Station: normal Patient leans: N/A  Psychiatric Specialty Exam: ROS no headache, no chest pain, no shortness of breath,  No nausea, no vomiting, no diarrhea   Blood pressure 126/89, pulse 92, temperature 97.7 F (36.5 C), resp. rate 20, height 6' 0.75" (1.848 m), weight 255 lb (115.7 kg).Body mass index is 33.87 kg/m.  General Appearance: Well Groomed  Eye Contact::  Good  Speech:  Normal Rate409  Volume:  Normal  Mood:  euthymic   Affect:  Appropriate and Full Range  Thought Process:  Linear  Orientation:  Full (Time, Place, and Person)  Thought Content:  no hallucinations, no delusions   Suicidal Thoughts:  No- denies any suicidal or self injurious ideations   Homicidal Thoughts:  No- denies any homicidal ideations   Memory:  recent and remote grossly intact   Judgement:  Other:  improved   Insight:  improved   Psychomotor Activity:  Normal  Concentration:  Good  Recall:  Good  Fund of Knowledge:Good  Language: Good  Akathisia:  Negative   Handed:  Right  AIMS (if indicated):     Assets:  Communication Skills Desire for Improvement Resilience  Sleep:  Number of Hours: (P) 5.75  Cognition: WNL  ADL's:  Intact   Mental Status Per Nursing Assessment::   On Admission:  NA  Demographic Factors:  25 year old single male , was living with parents , plans to go to to a [place] based residential setting Physicist, medical)   Loss Factors: States " I was just very tired of using drugs "  Historical Factors: Opiate Dependence - Depression, possibly substance induced   Risk Reduction Factors:   Sense of responsibility to family, Living with another person, especially a relative, Positive social support and Positive coping skills or problem solving skills  Continued Clinical Symptoms:  At this time patient improved, presents with improved mood and range of affect, no thought disorder, no SI or HI, no psychotic symptoms, no lingering symptoms of opiate WDL . Denies medication side effects.   Cognitive Features That Contribute To Risk:  No gross cognitive deficits noted upon discharge. Is alert , attentive, and oriented x 3   Suicide Risk:  Mild:  Suicidal ideation of limited frequency, intensity, duration, and specificity.  There are no identifiable plans, no associated intent, mild dysphoria and related symptoms, good self-control (both objective and subjective assessment), few other risk factors, and identifiable protective factors, including available and accessible social support.  Follow-up Information    [facility] Follow up on 12/07/2015.   Why:  accepted for Monday. Please arrive as soon as possible on this date.  Contact information: [address] [place], [place] [address]  Phone: [phone]        [facility] .   Why:  Walk in between 8am-3pm for assessment of mental health services including: Medication Management/counseling/substance abuse treatment.  Contact information: [address] [place],  [place] [address] Phone: [phone] Fax: [phone]          Plan Of Care/Follow-up recommendations:  Activity:  as tolerated  Diet:  Regular  Tests:  NA Other:  see below   Patient is leaving unit in good spirits, plans to [facility] as above    Nehemiah Massed, MD 12/07/2015, 9:56 AM

## 2015-12-07 NOTE — Progress Notes (Signed)
Recreation Therapy Notes  Date: 12/07/15 Time: 0930 Location: [address] Group Room  Group Topic: Stress Management  Goal Area(s) Addresses:  Patient will verbalize importance of using healthy stress management.  Patient will identify positive emotions associated with healthy stress management.   Intervention: Stress Management  Activity :  Guided Imagery.  LRT introduced and educated patients on stress management technique of guided imagery.  A script was used to deliver to the technique to patients.  Patients were asked to follow the script read allowed by LRT to engage in practicing the technique.  Education:  Stress Management, Discharge Planning.   Clinical Observations/Feedback: Pt did not attend group.   Sharla Tankard, LRT/CTRS  

## 2015-12-07 NOTE — Progress Notes (Signed)
D: Pt endorsed moderate anxiety; states, "I will be leaving to his 90-day program; I have never done anything like his before; I'm a little anxious." depression and generalized body pain; Pt denied depression, SI, HI, AVH or pain. Pt was however, flat and withdrawn to his room. Pt remained calm and cooperative.   A: Medications offered as prescribed.  Support, encouragement, and safe environment provided.  15-minute safety checks continue.   R: Pt was med compliant.  Pt did not attend AA group meeting. Safety checks continue.

## 2015-12-15 ENCOUNTER — Encounter (HOSPITAL_COMMUNITY): Payer: Self-pay | Admitting: Emergency Medicine

## 2015-12-15 ENCOUNTER — Emergency Department (HOSPITAL_COMMUNITY): Payer: Self-pay

## 2015-12-15 ENCOUNTER — Emergency Department (HOSPITAL_COMMUNITY)
Admission: EM | Admit: 2015-12-15 | Discharge: 2015-12-15 | Disposition: A | Discharge status: Home / Self Care | Payer: Self-pay | Attending: Emergency Medicine | Admitting: Emergency Medicine

## 2015-12-15 DIAGNOSIS — K5733 Diverticulitis of large intestine without perforation or abscess with bleeding: Secondary | ICD-10-CM | POA: Insufficient documentation

## 2015-12-15 LAB — CBC WITH DIFFERENTIAL/PLATELET
BASOS PCT: 0 %
Basophils Absolute: 0 10*3/uL (ref 0.0–0.1)
EOS ABS: 0.1 10*3/uL (ref 0.0–0.7)
EOS PCT: 1 %
HCT: 42.4 % (ref 39.0–52.0)
HEMOGLOBIN: 14.4 g/dL (ref 13.0–17.0)
Lymphocytes Relative: 20 %
Lymphs Abs: 1.7 10*3/uL (ref 0.7–4.0)
MCH: 28.1 pg (ref 26.0–34.0)
MCHC: 34 g/dL (ref 30.0–36.0)
MCV: 82.8 fL (ref 78.0–100.0)
Monocytes Absolute: 0.6 10*3/uL (ref 0.1–1.0)
Monocytes Relative: 7 %
NEUTROS PCT: 72 %
Neutro Abs: 5.9 10*3/uL (ref 1.7–7.7)
PLATELETS: 242 10*3/uL (ref 150–400)
RBC: 5.12 MIL/uL (ref 4.22–5.81)
RDW: 13.4 % (ref 11.5–15.5)
WBC: 8.3 10*3/uL (ref 4.0–10.5)

## 2015-12-15 LAB — URINALYSIS, ROUTINE W REFLEX MICROSCOPIC
Bilirubin Urine: NEGATIVE
Glucose, UA: NEGATIVE mg/dL
Hgb urine dipstick: NEGATIVE
KETONES UR: NEGATIVE mg/dL
LEUKOCYTES UA: NEGATIVE
NITRITE: NEGATIVE
PH: 7.5 (ref 5.0–8.0)
PROTEIN: NEGATIVE mg/dL
Specific Gravity, Urine: >1.046 — ABNORMAL HIGH (ref 1.005–1.030)

## 2015-12-15 LAB — COMPREHENSIVE METABOLIC PANEL
ALK PHOS: 59 U/L (ref 38–126)
ALT: 17 U/L (ref 17–63)
AST: 15 U/L (ref 15–41)
Albumin: 4 g/dL (ref 3.5–5.0)
Anion gap: 6 (ref 5–15)
BILIRUBIN TOTAL: 0.4 mg/dL (ref 0.3–1.2)
BUN: 11 mg/dL (ref 6–20)
CALCIUM: 9.5 mg/dL (ref 8.9–10.3)
CO2: 24 mmol/L (ref 22–32)
CREATININE: 0.96 mg/dL (ref 0.61–1.24)
Chloride: 108 mmol/L (ref 101–111)
Glucose, Bld: 89 mg/dL (ref 65–99)
Potassium: 3.9 mmol/L (ref 3.5–5.1)
Sodium: 138 mmol/L (ref 135–145)
Total Protein: 6.7 g/dL (ref 6.5–8.1)

## 2015-12-15 LAB — PROTIME-INR
INR: 0.99
Prothrombin Time: 13.1 seconds (ref 11.4–15.2)

## 2015-12-15 LAB — I-STAT CG4 LACTIC ACID, ED: LACTIC ACID, VENOUS: 1 mmol/L (ref 0.5–1.9)

## 2015-12-15 LAB — POC OCCULT BLOOD, ED: FECAL OCCULT BLD: POSITIVE — AB

## 2015-12-15 LAB — LIPASE, BLOOD: LIPASE: 52 U/L — AB (ref 11–51)

## 2015-12-15 MED ORDER — HYDROMORPHONE HCL 1 MG/ML IJ SOLN: 1.0000 mg | Freq: Once | INTRAMUSCULAR | Status: DC

## 2015-12-15 MED ORDER — FENTANYL CITRATE (PF) 100 MCG/2ML IJ SOLN
50.0000 ug | Freq: Once | INTRAMUSCULAR | Status: AC
  Administered 2015-12-15: 50 ug via INTRAVENOUS
  Filled 2015-12-15: qty 2

## 2015-12-15 MED ORDER — ONDANSETRON 4 MG PO TBDP
4.0000 mg | ORAL_TABLET | Freq: Three times a day (TID) | ORAL | 0 refills | Status: DC | PRN
Start: 2015-12-15 — End: 2016-08-07

## 2015-12-15 MED ORDER — ONDANSETRON HCL 4 MG/2ML IJ SOLN
4.0000 mg | Freq: Once | INTRAMUSCULAR | Status: AC
  Administered 2015-12-15: 4 mg via INTRAVENOUS
  Filled 2015-12-15: qty 2

## 2015-12-15 MED ORDER — KETOROLAC TROMETHAMINE 30 MG/ML IJ SOLN
15.0000 mg | Freq: Once | INTRAMUSCULAR | Status: AC
  Administered 2015-12-15: 15 mg via INTRAVENOUS
  Filled 2015-12-15: qty 1

## 2015-12-15 MED ORDER — SODIUM CHLORIDE 0.9 % IV BOLUS (SEPSIS)
1000.0000 mL | Freq: Once | INTRAVENOUS | Status: AC
  Administered 2015-12-15: 1000 mL via INTRAVENOUS

## 2015-12-15 MED ORDER — IOPAMIDOL (ISOVUE-300) INJECTION 61%
INTRAVENOUS | Status: AC
  Administered 2015-12-15: 100 mL
  Filled 2015-12-15: qty 100

## 2015-12-15 MED ORDER — CIPROFLOXACIN HCL 500 MG PO TABS: 500.0000 mg | ORAL_TABLET | Freq: Two times a day (BID) | ORAL | 0 refills | Status: DC

## 2015-12-15 MED ORDER — METRONIDAZOLE 500 MG PO TABS: 500.0000 mg | ORAL_TABLET | Freq: Three times a day (TID) | ORAL | 0 refills | Status: AC

## 2015-12-15 NOTE — ED Notes (Signed)
MD at bedside. 

## 2015-12-15 NOTE — ED Notes (Signed)
Patient transported to CT 

## 2015-12-15 NOTE — ED Notes (Signed)
Discharge instructions provided to patient with explanation of prescriptions, pt verbalized understanding, vss and pt ambulatory upon discharge

## 2015-12-15 NOTE — ED Triage Notes (Addendum)
Pt c/o sharp LLQ pain that began last night, pt reports "dry heaving." pt also reports bright red blood in his stools that began this am. Pt reports hx of diverticulitis and current treatment for cocaine and heroin addiction.

## 2015-12-15 NOTE — ED Notes (Signed)
MD at bedside to explain plan of care.

## 2015-12-15 NOTE — ED Provider Notes (Signed)
[facility]-EMERGENCY DEPT Provider Note   CSN: [id number] Arrival date & time: 12/15/15  6440  First Provider Contact:  None       History   Chief Complaint Chief Complaint  Patient presents with  . Abdominal Pain  . Rectal Bleeding    HPI [name] is a 25 y.o. male.  HPI 25 year old male with past medical history of polysubstance abuse, sigmoid diverticulitis status post partial colectomy, and recurrent diverticulitis who presents with worsening left lower quadrant pain. The patient states he felt generally weak throughout the day yesterday. He then began to develop a gnawing, aching left lower quadrant pain. He describes the pain as localized primarily left lower quadrant with intermittent radiation to the left flank. He has had associated diarrhea and now bright red blood mixed with his diarrhea. The symptoms feel similar to his previous episodes of diverticulitis. Denies any fevers. Has had some nausea and began dry heaving today. He has not tolerated any by mouth throughout the day today. He states he feels generally weak. Denies any chest pain, shortness of breath. He is not on blood thinners. Denies any known alleviating or aggravating factors  Past Medical History:  Diagnosis Date  . Anxiety   . Attention deficit disorder   . Depression   . Diverticulitis   . Hiccups    pt states hiccups during sleep     Patient Active Problem List   Diagnosis Date Noted  . Substance induced mood disorder (HCC)   . Atypical depression (HCC)   . Polysubstance (including opioids) dependence with physiol dependence (HCC) 08/23/2014  . Sigmoid diverticulitis s/p lap assisted sigmoid colectomy 07/01/14 07/01/2014  . Diverticulitis 12/06/2013  . Diverticulitis of large intestine without perforation or abscess without bleeding 12/06/2013  . Obesity, unspecified 12/06/2013  . Acute diverticulitis 12/06/2013  . NAUSEA AND VOMITING 02/09/2009  . OTHER SYMPTOMS INVOLVING DIGESTIVE SYSTEM OTHER  02/09/2009    Past Surgical History:  Procedure Laterality Date  . colonscopy      removed polyps   . LAPAROSCOPIC PARTIAL COLECTOMY N/A 07/01/2014   Procedure: LAPAROSCOPIC ASSISTED SIGMOID COLECTOMY WITH MOBILIZATION OF SPLENIC FLEXURE;  Surgeon: Avel Peace, MD;  Location: [facility];  Service: General;  Laterality: N/A;       Home Medications    Prior to Admission medications   Medication Sig Start Date End Date Taking? Authorizing Provider  busPIRone (BUSPAR) 5 MG tablet Take 1 tablet (5 mg total) by mouth 3 (three) times daily. 12/07/15  Yes Oneta Rack, NP  citalopram (CELEXA) 20 MG tablet Take 1 tablet (20 mg total) by mouth daily. 12/07/15  Yes Oneta Rack, NP  traZODone (DESYREL) 50 MG tablet Take 1 tablet (50 mg total) by mouth at bedtime as needed for sleep. 12/07/15  Yes Oneta Rack, NP  ciprofloxacin (CIPRO) 500 MG tablet Take 1 tablet (500 mg total) by mouth 2 (two) times daily. 12/15/15   Shaune Pollack, MD  metroNIDAZOLE (FLAGYL) 500 MG tablet Take 1 tablet (500 mg total) by mouth 3 (three) times daily. 12/15/15 12/25/15  Shaune Pollack, MD  ondansetron (ZOFRAN ODT) 4 MG disintegrating tablet Take 1 tablet (4 mg total) by mouth every 8 (eight) hours as needed for nausea or vomiting. 12/15/15   Shaune Pollack, MD    Family History No family history on file.  Social History Social History  Substance Use Topics  . Smoking status: Never Smoker  . Smokeless tobacco: Current User    Types: Chew  . Alcohol  use Yes     Comment: occasionally     Allergies   Cephalexin   Review of Systems Review of Systems  Constitutional: Positive for fatigue. Negative for chills and fever.  HENT: Negative for congestion and rhinorrhea.   Eyes: Negative for visual disturbance.  Respiratory: Negative for cough, shortness of breath and wheezing.   Cardiovascular: Negative for chest pain and leg swelling.  Gastrointestinal: Positive for abdominal pain, blood in stool, diarrhea  and nausea. Negative for vomiting.  Genitourinary: Negative for dysuria and flank pain.  Musculoskeletal: Negative for neck pain and neck stiffness.  Skin: Negative for rash and wound.  Allergic/Immunologic: Negative for immunocompromised state.  Neurological: Negative for syncope, weakness and headaches.     Physical Exam Updated Vital Signs BP 142/73   Pulse 77   Temp 98.2 F (36.8 C)   Resp 14   SpO2 100%   Physical Exam  Constitutional: He is oriented to person, place, and time. He appears well-developed and well-nourished. No distress.  HENT:  Head: Normocephalic and atraumatic.  Mouth/Throat: Oropharynx is clear and moist.  Eyes: Conjunctivae are normal. Pupils are equal, round, and reactive to light.  Neck: Neck supple.  Cardiovascular: Normal rate, regular rhythm and normal heart sounds.  Exam reveals no friction rub.   No murmur heard. Pulmonary/Chest: Effort normal and breath sounds normal. No respiratory distress. He has no wheezes. He has no rales.  Abdominal: Soft. Bowel sounds are normal. He exhibits no distension. There is tenderness (Moderate, left lower quadrant). There is guarding. There is no rebound.  Musculoskeletal: He exhibits no edema.  Neurological: He is alert and oriented to person, place, and time. He exhibits normal muscle tone.  Skin: Skin is warm. Capillary refill takes less than 2 seconds.  Nursing note and vitals reviewed.    ED Treatments / Results  Labs (all labs ordered are listed, but only abnormal results are displayed) Labs Reviewed  LIPASE, BLOOD - Abnormal; Notable for the following:       Result Value   Lipase 52 (*)    All other components within normal limits  URINALYSIS, ROUTINE W REFLEX MICROSCOPIC (NOT AT [facility]) - Abnormal; Notable for the following:    Specific Gravity, Urine >1.046 (*)    All other components within normal limits  POC OCCULT BLOOD, ED - Abnormal; Notable for the following:    Fecal Occult Bld POSITIVE (*)     All other components within normal limits  COMPREHENSIVE METABOLIC PANEL  CBC WITH DIFFERENTIAL/PLATELET  PROTIME-INR  I-STAT CG4 LACTIC ACID, ED    EKG  EKG Interpretation None       Radiology Ct Abdomen Pelvis W Contrast  Result Date: 12/15/2015 CLINICAL DATA:  Left lower quadrant abdominal pain since last night with nausea and bloody stools. History of diverticulitis and partial colectomy. EXAM: CT ABDOMEN AND PELVIS WITH CONTRAST TECHNIQUE: Multidetector CT imaging of the abdomen and pelvis was performed using the standard protocol following bolus administration of intravenous contrast. CONTRAST:  ISOVUE-300 IOPAMIDOL (ISOVUE-300) INJECTION 61% COMPARISON:  06/29/2015 FINDINGS: Minimal dependent atelectasis is noted in the right lower lobe. A calcified granuloma is noted in the left lower lobe. There is no pleural effusion. Focal hypoattenuation in the liver along the falciform ligament is unchanged and likely reflects focal fatty infiltration. Faint curvilinear hypodensities in the right hepatic dome are unchanged. The gallbladder is unremarkable. No biliary dilatation is seen. The spleen remains borderline enlarged. The adrenal glands, kidneys, and pancreas are unremarkable. The  small and large bowel are nondilated without evidence of obstruction. Changes of partial colectomy are again noted with anastomosis at the proximal sigmoid level. No bowel wall thickening or surrounding inflammation is identified. The appendix is unremarkable. The bladder and prostate are unremarkable. The aorta is normal in caliber. No free fluid or enlarged lymph nodes are identified. No acute osseous abnormality is seen. IMPRESSION: Postsurgical changes of prior partial colectomy without evidence of acute abnormality. Electronically Signed   By: Sebastian Ache M.D.   On: 12/15/2015 11:09    Procedures Procedures (including critical care time)  Medications Ordered in ED Medications  sodium chloride 0.9  % bolus 1,000 mL (0 mLs Intravenous Stopped 12/15/15 1158)  ondansetron (ZOFRAN) injection 4 mg (4 mg Intravenous Given 12/15/15 0953)  fentaNYL (SUBLIMAZE) injection 50 mcg (50 mcg Intravenous Given 12/15/15 1020)  iopamidol (ISOVUE-300) 61 % injection (100 mLs  Contrast Given 12/15/15 1041)  ketorolac (TORADOL) 30 MG/ML injection 15 mg (15 mg Intravenous Given 12/15/15 1147)  sodium chloride 0.9 % bolus 1,000 mL (0 mLs Intravenous Stopped 12/15/15 1307)     Initial Impression / Assessment and Plan / ED Course  I have reviewed the triage vital signs and the nursing notes.  Pertinent labs & imaging results that were available during my care of the patient were reviewed by me and considered in my medical decision making (see chart for details).  Clinical Course  Comment By Time  25 year old male with past medical history of recurrent diverticulitis status post partial colectomy who presents with worsening left lower quadrant abdominal pain. On arrival, vital signs are stable and within normal limits. Examination is as above and is overall reassuring, although he does have left lower quadrant tenderness and guarding. Will obtain CT scan given his complicated history, and otherwise send screening labs. No dysuria or signs of UTI. No signs of testicular torsion or testicular etiology. Patient is otherwise afebrile without signs of sepsis. Shaune Pollack, MD 08/01 [id number]  Labs reviewed as above. Labs overall very reassuring. He has a normal white blood cell count. Lactic acid is normal. Lipase minimally elevated, but does not fit criteria for pancreatitis. Normal renal function and LFTs. Awaiting CT scan. Shaune Pollack, MD 08/01 1019   CT scan shows no acute abnormality. Specifically, there is no evidence of obstruction or perforation. Patient is markedly improved on exam and is tolerating by mouth. Given stable vital signs, reassuring lab work, and normal CT scan, believe patient is safe and stable for discharge.  Given his complicated history of recurrent diverticulitis and symptoms consistent with this, will treat for possible early diverticulitis. Patient will return home to rehabilitation facility and can be monitored closely. Return precautions given in detail.   Final Clinical Impressions(s) / ED Diagnoses   Final diagnoses:  Diverticulitis of large intestine without perforation or abscess with bleeding    New Prescriptions Discharge Medication List as of 12/15/2015 12:06 PM    START taking these medications   Details  ciprofloxacin (CIPRO) 500 MG tablet Take 1 tablet (500 mg total) by mouth 2 (two) times daily., Starting Tue 12/15/2015, Print    metroNIDAZOLE (FLAGYL) 500 MG tablet Take 1 tablet (500 mg total) by mouth 3 (three) times daily., Starting Tue 12/15/2015, Until Fri 12/25/2015, Print         Shaune Pollack, MD 12/16/15 [phone]

## 2015-12-31 ENCOUNTER — Emergency Department (HOSPITAL_COMMUNITY)
Admission: EM | Admit: 2015-12-31 | Discharge: 2015-12-31 | Disposition: A | Discharge status: Home / Self Care | Payer: 59 | Attending: Emergency Medicine | Admitting: Emergency Medicine

## 2015-12-31 ENCOUNTER — Encounter (HOSPITAL_COMMUNITY): Payer: Self-pay | Admitting: Emergency Medicine

## 2015-12-31 DIAGNOSIS — F909 Attention-deficit hyperactivity disorder, unspecified type: Secondary | ICD-10-CM | POA: Insufficient documentation

## 2015-12-31 DIAGNOSIS — R21 Rash and other nonspecific skin eruption: Secondary | ICD-10-CM | POA: Insufficient documentation

## 2015-12-31 MED ORDER — HYDROCORTISONE 2.5 % EX LOTN: TOPICAL_LOTION | Freq: Two times a day (BID) | CUTANEOUS | 0 refills | Status: DC

## 2015-12-31 MED ORDER — DIPHENHYDRAMINE HCL 25 MG PO CAPS: 25.0000 mg | ORAL_CAPSULE | Freq: Four times a day (QID) | ORAL | 0 refills | Status: DC | PRN

## 2015-12-31 NOTE — Discharge Instructions (Signed)
Medications: hydrocortisone cream, Benadryl, ibuprofen  Treatment: Apply cream twice daily to affected area. Take Benadryl every 6 hours as needed for itching or burning. Take ibuprofen every 6 hours as needed for pain or burning.   Follow-up: Please follow up with your doctor in 1-2 days for follow up and further evaluation and treatment. Please follow up with Dr. Margo AyeHall, a dermatologist, if your rash does not resolve in 1 week. Please return to the emergency department if your develop any new or worsening symptoms, including fevers, headache, neck pain/stiffness, or any other new or concerning symptom.

## 2015-12-31 NOTE — ED Provider Notes (Signed)
[facility]-EMERGENCY DEPT Provider Note   CSN: [id number] Arrival date & time: 12/31/15  1611  By signing my name below, I, Emmanuella Mensah, attest that this documentation has been prepared under the direction and in the presence of [employer] Electriclexandra Trigg Delarocha, PA-C. Electronically Signed: Angelene GiovanniEmmanuella Mensah, ED Scribe. 12/31/15. 4:51 PM.    History   Chief Complaint Chief Complaint  Patient presents with  . Rash    HPI Comments: [name] is a 25 y.o. male who presents to the Emergency Department complaining of ongoing moderately burning non-itchy rash to his BLE s/p working outside at 2:30 pm yesterday. He explains that he noticed his rash after working in long pants out in the field where he felt his BLE "on fire". He adds that the rash has become gradually darker. He denies that he was out in the woods prior to going to work yesterday, although he endorses possible tick/insect exposure. He denies any new soaps, detergent, or lotion. No alleviating factors noted. Pt has not tried any medications PTA. He denies any fever, chills, abdominal pain, n/v, shortness of breath, trouble swallowing, lip/tongue swallowing, or any open wounds.    The history is provided by the patient. No language interpreter was used.    Past Medical History:  Diagnosis Date  . Anxiety   . Attention deficit disorder   . Depression   . Diverticulitis   . Hiccups    pt states hiccups during sleep     Patient Active Problem List   Diagnosis Date Noted  . Substance induced mood disorder (HCC)   . Atypical depression (HCC)   . Polysubstance (including opioids) dependence with physiol dependence (HCC) 08/23/2014  . Sigmoid diverticulitis s/p lap assisted sigmoid colectomy 07/01/14 07/01/2014  . Diverticulitis 12/06/2013  . Diverticulitis of large intestine without perforation or abscess without bleeding 12/06/2013  . Obesity, unspecified 12/06/2013  . Acute diverticulitis 12/06/2013  . NAUSEA AND VOMITING 02/09/2009    . OTHER SYMPTOMS INVOLVING DIGESTIVE SYSTEM OTHER 02/09/2009    Past Surgical History:  Procedure Laterality Date  . colonscopy      removed polyps   . LAPAROSCOPIC PARTIAL COLECTOMY N/A 07/01/2014   Procedure: LAPAROSCOPIC ASSISTED SIGMOID COLECTOMY WITH MOBILIZATION OF SPLENIC FLEXURE;  Surgeon: Avel Peaceodd Rosenbower, MD;  Location: [facility];  Service: General;  Laterality: N/A;       Home Medications    Prior to Admission medications   Medication Sig Start Date End Date Taking? Authorizing Provider  busPIRone (BUSPAR) 5 MG tablet Take 1 tablet (5 mg total) by mouth 3 (three) times daily. 12/07/15   Oneta Rackanika N Lewis, NP  ciprofloxacin (CIPRO) 500 MG tablet Take 1 tablet (500 mg total) by mouth 2 (two) times daily. 12/15/15   Shaune Pollackameron Isaacs, MD  citalopram (CELEXA) 20 MG tablet Take 1 tablet (20 mg total) by mouth daily. 12/07/15   Oneta Rackanika N Lewis, NP  diphenhydrAMINE (BENADRYL) 25 mg capsule Take 1 capsule (25 mg total) by mouth every 6 (six) hours as needed. 12/31/15   Emi HolesAlexandra M Dierra Riesgo, PA-C  hydrocortisone 2.5 % lotion Apply topically 2 (two) times daily. 12/31/15   Aldric Wenzler M Elisabeth Strom, PA-C  ondansetron (ZOFRAN ODT) 4 MG disintegrating tablet Take 1 tablet (4 mg total) by mouth every 8 (eight) hours as needed for nausea or vomiting. 12/15/15   Shaune Pollackameron Isaacs, MD  traZODone (DESYREL) 50 MG tablet Take 1 tablet (50 mg total) by mouth at bedtime as needed for sleep. 12/07/15   Oneta Rackanika N Lewis, NP    Family  History No family history on file.  Social History Social History  Substance Use Topics  . Smoking status: Never Smoker  . Smokeless tobacco: Current User    Types: Chew  . Alcohol use Yes     Comment: occasionally     Allergies   Cephalexin   Review of Systems Review of Systems  Constitutional: Negative for chills and fever.  HENT: Negative for facial swelling and trouble swallowing.   Respiratory: Negative for shortness of breath.   Gastrointestinal: Negative for abdominal pain, nausea  and vomiting.  Skin: Positive for rash. Negative for wound.     Physical Exam Updated Vital Signs BP 149/80 (BP Location: Right Arm)   Pulse 101   Temp 98.4 F (36.9 C) (Oral)   Resp 16   Ht 6\' 1"  (1.854 m)   Wt 117.9 kg   SpO2 97%   BMI 34.30 kg/m   Physical Exam  Constitutional: He appears well-developed and well-nourished. No distress.  HENT:  Head: Normocephalic and atraumatic.  Mouth/Throat: Oropharynx is clear and moist. No oropharyngeal exudate.  Eyes: Conjunctivae are normal. Pupils are equal, round, and reactive to light. Right eye exhibits no discharge. Left eye exhibits no discharge. No scleral icterus.  Neck: Normal range of motion and full passive range of motion without pain. Neck supple. No thyromegaly present.  No meningismus  Cardiovascular: Normal rate, regular rhythm, normal heart sounds and intact distal pulses.  Exam reveals no gallop and no friction rub.   No murmur heard. Pulmonary/Chest: Effort normal and breath sounds normal. No stridor. No respiratory distress. He has no wheezes. He has no rales.  Abdominal: Soft. Bowel sounds are normal. He exhibits no distension. There is no tenderness. There is no rebound and no guarding.  Musculoskeletal: He exhibits no edema.  Lymphadenopathy:    He has no cervical adenopathy.  Neurological: He is alert. Coordination normal.  Skin: Skin is warm and dry. No rash noted. He is not diaphoretic. No pallor.  Nonpalpable, nontender rash as shown below; rash only present between knee and ankle bilaterally  Psychiatric: He has a normal mood and affect.  Nursing note and vitals reviewed.    ED Treatments / Results  DIAGNOSTIC STUDIES: Oxygen Saturation is 97% on RA, normal by my interpretation.    COORDINATION OF CARE: 4:49 PM- Pt reports that he is currently in a program and declines any pain medication. Will consult attending for further evaluation.    Labs (all labs ordered are listed, but only abnormal results  are displayed) Labs Reviewed - No data to display  EKG  EKG Interpretation None       Radiology No results found.       Procedures Procedures (including critical care time)  Medications Ordered in ED Medications - No data to display   Initial Impression / Assessment and Plan / ED Course  Buel Ream, PA-C has reviewed the triage vital signs and the nursing notes.  Pertinent labs & imaging results that were available during my care of the patient were reviewed by me and considered in my medical decision making (see chart for details).  Clinical Course    Patient with nonspecific eruption. No signs of infection or signs or serious pathology. Patient denies any fevers, headaches, neck pain or stiffness. Discharge with symptomatic treatment including hydrocortisone cream, Benadryl, ibuprofen. Follow up with PCP in 2-3 days. Patient also given follow-up to dermatology as needed. Return precautions discussed. I discussed patient with Dr. Jacqulyn Bath who guided the patient's  management and agrees with plan. Patient vitals stable throughout ED course and discharged in satisfactory condition.    Final Clinical Impressions(s) / ED Diagnoses   Final diagnoses:  Rash    New Prescriptions New Prescriptions   DIPHENHYDRAMINE (BENADRYL) 25 MG CAPSULE    Take 1 capsule (25 mg total) by mouth every 6 (six) hours as needed.   HYDROCORTISONE 2.5 % LOTION    Apply topically 2 (two) times daily.   I personally performed the services described in this documentation, which was scribed in my presence. The recorded information has been reviewed and is accurate.       Emi Holeslexandra M Oskar Cretella, PA-C 12/31/15 1923    Maia PlanJoshua G Long, MD 01/01/16 1013

## 2015-12-31 NOTE — ED Triage Notes (Signed)
Pt c/o bil lower leg rash.  Onset yesterday. Denies itching, c/o rash burning

## 2015-12-31 NOTE — ED Notes (Signed)
Pt stable, ambulatory, states understanding of discharge instructions 

## 2016-08-07 ENCOUNTER — Inpatient Hospital Stay (HOSPITAL_COMMUNITY)
Admission: AD | Admit: 2016-08-07 | Discharge: 2016-08-12 | DRG: 897 | Disposition: A | Discharge status: Home / Self Care | Payer: No Typology Code available for payment source | Attending: Psychiatry | Admitting: Psychiatry

## 2016-08-07 ENCOUNTER — Encounter (HOSPITAL_COMMUNITY): Payer: Self-pay

## 2016-08-07 DIAGNOSIS — F4310 Post-traumatic stress disorder, unspecified: Secondary | ICD-10-CM | POA: Diagnosis not present

## 2016-08-07 DIAGNOSIS — Z23 Encounter for immunization: Secondary | ICD-10-CM

## 2016-08-07 DIAGNOSIS — R45851 Suicidal ideations: Secondary | ICD-10-CM | POA: Diagnosis present

## 2016-08-07 DIAGNOSIS — F17220 Nicotine dependence, chewing tobacco, uncomplicated: Secondary | ICD-10-CM | POA: Diagnosis present

## 2016-08-07 DIAGNOSIS — R45850 Homicidal ideations: Secondary | ICD-10-CM | POA: Diagnosis present

## 2016-08-07 DIAGNOSIS — Z818 Family history of other mental and behavioral disorders: Secondary | ICD-10-CM | POA: Diagnosis not present

## 2016-08-07 DIAGNOSIS — F1424 Cocaine dependence with cocaine-induced mood disorder: Secondary | ICD-10-CM | POA: Diagnosis present

## 2016-08-07 DIAGNOSIS — F1994 Other psychoactive substance use, unspecified with psychoactive substance-induced mood disorder: Secondary | ICD-10-CM | POA: Diagnosis present

## 2016-08-07 DIAGNOSIS — Z813 Family history of other psychoactive substance abuse and dependence: Secondary | ICD-10-CM | POA: Diagnosis not present

## 2016-08-07 DIAGNOSIS — Z79899 Other long term (current) drug therapy: Secondary | ICD-10-CM | POA: Diagnosis not present

## 2016-08-07 DIAGNOSIS — F332 Major depressive disorder, recurrent severe without psychotic features: Secondary | ICD-10-CM | POA: Diagnosis present

## 2016-08-07 DIAGNOSIS — F909 Attention-deficit hyperactivity disorder, unspecified type: Secondary | ICD-10-CM | POA: Diagnosis present

## 2016-08-07 DIAGNOSIS — F1124 Opioid dependence with opioid-induced mood disorder: Secondary | ICD-10-CM | POA: Diagnosis present

## 2016-08-07 DIAGNOSIS — F1920 Other psychoactive substance dependence, uncomplicated: Secondary | ICD-10-CM | POA: Diagnosis not present

## 2016-08-07 MED ORDER — TRAZODONE HCL 50 MG PO TABS
50.0000 mg | ORAL_TABLET | Freq: Every evening | ORAL | Status: DC | PRN
  Administered 2016-08-08 – 2016-08-11 (×5): 50 mg via ORAL
  Filled 2016-08-07 (×2): qty 1
  Filled 2016-08-07: qty 7
  Filled 2016-08-07: qty 1

## 2016-08-07 MED ORDER — CITALOPRAM HYDROBROMIDE 20 MG PO TABS
20.0000 mg | ORAL_TABLET | Freq: Every day | ORAL | Status: DC
  Administered 2016-08-08: 20 mg via ORAL
  Filled 2016-08-07 (×4): qty 1

## 2016-08-07 MED ORDER — HYDROXYZINE HCL 25 MG PO TABS
25.0000 mg | ORAL_TABLET | Freq: Three times a day (TID) | ORAL | Status: DC | PRN
  Administered 2016-08-08 – 2016-08-10 (×6): 25 mg via ORAL
  Filled 2016-08-07 (×6): qty 1

## 2016-08-07 MED ORDER — ACETAMINOPHEN 325 MG PO TABS
650.0000 mg | ORAL_TABLET | Freq: Four times a day (QID) | ORAL | Status: DC | PRN
  Administered 2016-08-11: 650 mg via ORAL
  Filled 2016-08-07: qty 2

## 2016-08-07 MED ORDER — MAGNESIUM HYDROXIDE 400 MG/5ML PO SUSP: 30.0000 mL | Freq: Every day | ORAL | Status: DC | PRN

## 2016-08-07 MED ORDER — BUSPIRONE HCL 5 MG PO TABS
5.0000 mg | ORAL_TABLET | Freq: Three times a day (TID) | ORAL | Status: DC
  Administered 2016-08-08 (×2): 5 mg via ORAL
  Filled 2016-08-07 (×7): qty 1

## 2016-08-07 MED ORDER — ALUM & MAG HYDROXIDE-SIMETH 200-200-20 MG/5ML PO SUSP: 20.0000 mL | Freq: Four times a day (QID) | ORAL | Status: DC | PRN
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
## 2016-08-07 NOTE — H&P (Signed)
Behavioral Health Medical Screening Exam  [name] is an 26 y.o. male.  Total Time spent with patient: 20 minutes  Psychiatric Specialty Exam: Physical Exam  Constitutional: He is oriented to person, place, and time. He appears well-developed and well-nourished. No distress.  HENT:  Head: Normocephalic and atraumatic.  Right Ear: External ear normal.  Left Ear: External ear normal.  Eyes: Conjunctivae are normal. Pupils are equal, round, and reactive to light. Right eye exhibits no discharge. Left eye exhibits no discharge. No scleral icterus.  Neck: Normal range of motion.  Cardiovascular: Normal rate, regular rhythm and normal heart sounds.   Respiratory: Effort normal and breath sounds normal. No respiratory distress.  Musculoskeletal: Normal range of motion.  Neurological: He is alert and oriented to person, place, and time.  Skin: Skin is warm and dry. He is not diaphoretic.  Psychiatric: His speech is normal and behavior is normal. His mood appears anxious. His affect is blunt. Thought content is not paranoid and not delusional. Cognition and memory are normal. He expresses impulsivity and inappropriate judgment. He exhibits a depressed mood. He expresses suicidal ideation. He expresses no homicidal ideation. He expresses suicidal plans.    Review of Systems  Psychiatric/Behavioral: Positive for depression, hallucinations, substance abuse and suicidal ideas. Negative for memory loss. The patient is nervous/anxious and has insomnia.   All other systems reviewed and are negative.   Blood pressure (!) 151/98, pulse 86, temperature 98 F (36.7 C), resp. rate 20, SpO2 97 %.There is no height or weight on file to calculate BMI.  General Appearance: Disheveled  Eye Contact:  Fair  Speech:  Clear and Coherent and Normal Rate  Volume:  Normal  Mood:  Anxious, Depressed, Hopeless and Worthless  Affect:  Blunt  Thought Process:  Coherent and Goal Directed  Orientation:  Full  (Time, Place, and Person)  Thought Content:  Logical and Hallucinations: Auditory Visual Non command hallucination. Hears "noises, like beeps". Sees shadow people.  Suicidal Thoughts:  Yes.  with intent/plan  Homicidal Thoughts:  No  Memory:  Immediate;   Good Recent;   Good Remote;   Fair  Judgement:  Impaired  Insight:  Lacking  Psychomotor Activity:  Normal  Concentration: Concentration: Fair and Attention Span: Fair  Recall:  Good  Fund of Knowledge:Good  Language: Good  Akathisia:  No  Handed:  Right  AIMS (if indicated):     Assets:  Communication Skills Desire for Improvement Leisure Time Physical Health Resilience  Sleep:       Musculoskeletal: Strength & Muscle Tone: within normal limits Gait & Station: normal   Blood pressure (!) 151/98, pulse 86, temperature 98 F (36.7 C), resp. rate 20, SpO2 97 %.  Recommendations:  Based on my evaluation the patient does not appear to have an emergency medical condition.  Jackelyn PolingJason A Deannie Resetar, NP 08/07/2016, 11:08 PM

## 2016-08-08 DIAGNOSIS — Z813 Family history of other psychoactive substance abuse and dependence: Secondary | ICD-10-CM

## 2016-08-08 DIAGNOSIS — F332 Major depressive disorder, recurrent severe without psychotic features: Secondary | ICD-10-CM

## 2016-08-08 DIAGNOSIS — F1920 Other psychoactive substance dependence, uncomplicated: Secondary | ICD-10-CM

## 2016-08-08 DIAGNOSIS — Z818 Family history of other mental and behavioral disorders: Secondary | ICD-10-CM

## 2016-08-08 DIAGNOSIS — Z79899 Other long term (current) drug therapy: Secondary | ICD-10-CM

## 2016-08-08 LAB — CBC
HEMATOCRIT: 44.7 % (ref 39.0–52.0)
Hemoglobin: 15.2 g/dL (ref 13.0–17.0)
MCH: 29 pg (ref 26.0–34.0)
MCHC: 34 g/dL (ref 30.0–36.0)
MCV: 85.1 fL (ref 78.0–100.0)
PLATELETS: 239 10*3/uL (ref 150–400)
RBC: 5.25 MIL/uL (ref 4.22–5.81)
RDW: 13.7 % (ref 11.5–15.5)
WBC: 8 10*3/uL (ref 4.0–10.5)

## 2016-08-08 LAB — RAPID URINE DRUG SCREEN, HOSP PERFORMED
AMPHETAMINES: NOT DETECTED
Barbiturates: NOT DETECTED
Benzodiazepines: NOT DETECTED
Cocaine: POSITIVE — AB
OPIATES: NOT DETECTED
TETRAHYDROCANNABINOL: POSITIVE — AB

## 2016-08-08 LAB — LIPID PANEL
CHOL/HDL RATIO: 3.4 ratio
CHOLESTEROL: 178 mg/dL (ref 0–200)
HDL: 52 mg/dL (ref >40)
LDL Cholesterol: 102 mg/dL — ABNORMAL HIGH (ref 0–99)
Triglycerides: 118 mg/dL (ref <150)
VLDL: 24 mg/dL (ref 0–40)

## 2016-08-08 LAB — COMPREHENSIVE METABOLIC PANEL
ALT: 24 U/L (ref 17–63)
ANION GAP: 7 (ref 5–15)
AST: 18 U/L (ref 15–41)
Albumin: 4.1 g/dL (ref 3.5–5.0)
Alkaline Phosphatase: 58 U/L (ref 38–126)
BILIRUBIN TOTAL: 0.4 mg/dL (ref 0.3–1.2)
BUN: 10 mg/dL (ref 6–20)
CO2: 30 mmol/L (ref 22–32)
Calcium: 9.4 mg/dL (ref 8.9–10.3)
Chloride: 103 mmol/L (ref 101–111)
Creatinine, Ser: 1.03 mg/dL (ref 0.61–1.24)
Glucose, Bld: 92 mg/dL (ref 65–99)
POTASSIUM: 4.2 mmol/L (ref 3.5–5.1)
Sodium: 140 mmol/L (ref 135–145)
TOTAL PROTEIN: 7.1 g/dL (ref 6.5–8.1)

## 2016-08-08 LAB — TSH: TSH: 0.935 u[IU]/mL (ref 0.350–4.500)

## 2016-08-08 MED ORDER — INFLUENZA VAC SPLIT QUAD 0.5 ML IM SUSY
0.5000 mL | PREFILLED_SYRINGE | INTRAMUSCULAR | Status: AC
  Administered 2016-08-09: 0.5 mL via INTRAMUSCULAR
  Filled 2016-08-08: qty 0.5

## 2016-08-08 MED ORDER — NICOTINE 21 MG/24HR TD PT24
21.0000 mg | MEDICATED_PATCH | Freq: Every day | TRANSDERMAL | Status: DC
  Administered 2016-08-08 – 2016-08-12 (×5): 21 mg via TRANSDERMAL
  Filled 2016-08-08 (×7): qty 1

## 2016-08-08 MED ORDER — QUETIAPINE FUMARATE 100 MG PO TABS
100.0000 mg | ORAL_TABLET | Freq: Every evening | ORAL | Status: DC | PRN
  Administered 2016-08-08 (×2): 100 mg via ORAL
  Filled 2016-08-08 (×8): qty 1

## 2016-08-08 MED ORDER — PNEUMOCOCCAL VAC POLYVALENT 25 MCG/0.5ML IJ INJ
0.5000 mL | INJECTION | INTRAMUSCULAR | Status: AC
  Administered 2016-08-09: 0.5 mL via INTRAMUSCULAR

## 2016-08-08 NOTE — Progress Notes (Addendum)
  DATA ACTION RESPONSE  Objective- Pt. is visible in his room, eating a meal. Pt. presents with an anxious affect and mood. Unit rules/polices explained; Pt. verbalized understanding.  Subjective- Denies having any SI/HI/AVH/Pain at this time. Pt. states " I am here to get help". Pt. continues to be cooperative and remain safe on the unit.  1:1 interaction in private to establish rapport. Encouragement, education, & support given from staff. No meds. Ordered at this time. PRN Trazodone and Vistaril requested and will re-eval accordingly. Labs in a.m.   Safety maintained with Q 15 checks. Continues to follow treatment plan and will monitor closely. No additonal questions/concerns noted.
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
## 2016-08-08 NOTE — Tx Team (Signed)
Interdisciplinary Treatment and Diagnostic Plan Update  08/08/2016 Time of Session: 0930  [name] MRN: [id number]  Principal Diagnosis: PTSD  Secondary Diagnoses: Active Problems:   Recurrent major depression-severe (HCC)   Current Medications:  Current Facility-Administered Medications  Medication Dose Route Frequency Provider Last Rate Last Dose  . acetaminophen (TYLENOL) tablet 650 mg  650 mg Oral Q6H PRN Jackelyn Poling, NP      . alum & mag hydroxide-simeth (MAALOX/MYLANTA) 200-200-20 MG/5ML suspension 20 mL  20 mL Oral Q6H PRN Jackelyn Poling, NP      . busPIRone (BUSPAR) tablet 5 mg  5 mg Oral TID Jackelyn Poling, NP   5 mg at 08/08/16 1212  . citalopram (CELEXA) tablet 20 mg  20 mg Oral Daily Jackelyn Poling, NP   20 mg at 08/08/16 4098  . hydrOXYzine (ATARAX/VISTARIL) tablet 25 mg  25 mg Oral TID PRN Jackelyn Poling, NP   25 mg at 08/08/16 0050  . [START ON 08/09/2016] Influenza vac split quadrivalent PF (FLUARIX) injection 0.5 mL  0.5 mL Intramuscular Tomorrow-1000 Fernando A Cobos, MD      . magnesium hydroxide (MILK OF MAGNESIA) suspension 30 mL  30 mL Oral Daily PRN Jackelyn Poling, NP      . nicotine (NICODERM CQ - dosed in mg/24 hours) patch 21 mg  21 mg Transdermal Daily Jackelyn Poling, NP   21 mg at 08/08/16 1191  . [START ON 08/09/2016] pneumococcal 23 valent vaccine (PNU-IMMUNE) injection 0.5 mL  0.5 mL Intramuscular Tomorrow-1000 Rockey Situ Cobos, MD      . traZODone (DESYREL) tablet 50 mg  50 mg Oral QHS PRN Jackelyn Poling, NP   50 mg at 08/08/16 0050   PTA Medications: Prescriptions Prior to Admission  Medication Sig Dispense Refill Last Dose  . citalopram (CELEXA) 20 MG tablet Take 1 tablet (20 mg total) by mouth daily. 30 tablet 0 Past Month at Unknown time  . traZODone (DESYREL) 50 MG tablet Take 1 tablet (50 mg total) by mouth at bedtime as needed for sleep. (Patient taking differently: Take 100 mg by mouth at bedtime as needed for sleep. ) 30 tablet 0 Past Month at  Unknown time  . [DISCONTINUED] ciprofloxacin (CIPRO) 500 MG tablet Take 1 tablet (500 mg total) by mouth 2 (two) times daily. 20 tablet 0   . [DISCONTINUED] hydrocortisone 2.5 % lotion Apply topically 2 (two) times daily. 118 mL 0   . [DISCONTINUED] ondansetron (ZOFRAN ODT) 4 MG disintegrating tablet Take 1 tablet (4 mg total) by mouth every 8 (eight) hours as needed for nausea or vomiting. 20 tablet 0     Patient Stressors: Financial difficulties Loss of home Medication change or noncompliance Substance abuse  Patient Strengths: Ability for insight Capable of independent living General fund of knowledge Physical Health Supportive family/friends  Treatment Modalities: Medication Management, Group therapy, Case management,  1 to 1 session with clinician, Psychoeducation, Recreational therapy.   Physician Treatment Plan for Primary Diagnosis: PTSD  Medication Management: Evaluate patient's response, side effects, and tolerance of medication regimen.  Therapeutic Interventions: 1 to 1 sessions, Unit Group sessions and Medication administration.  Evaluation of Outcomes: Progressing  Physician Treatment Plan for Secondary Diagnosis: Active Problems:   Recurrent major depression-severe (HCC)  Long Term Goal(s):     Short Term Goals:       Medication Management: Evaluate patient's response, side effects, and tolerance of medication regimen.  Therapeutic Interventions: 1 to 1 sessions, Unit Group  sessions and Medication administration.  Evaluation of Outcomes: Progressing   RN Treatment Plan for Primary Diagnosis: PTSD Long Term Goal(s): Knowledge of disease and therapeutic regimen to maintain health will improve  Short Term Goals: Ability to remain free from injury will improve, Ability to verbalize feelings will improve and Ability to disclose and discuss suicidal ideas  Medication Management: RN will administer medications as ordered by provider, will assess and evaluate  patient's response and provide education to patient for prescribed medication. RN will report any adverse and/or side effects to prescribing provider.  Therapeutic Interventions: 1 on 1 counseling sessions, Psychoeducation, Medication administration, Evaluate responses to treatment, Monitor vital signs and CBGs as ordered, Perform/monitor CIWA, COWS, AIMS and Fall Risk screenings as ordered, Perform wound care treatments as ordered.  Evaluation of Outcomes: Progressing   LCSW Treatment Plan for Primary Diagnosis: PTSD Long Term Goal(s): Safe transition to appropriate next level of care at discharge, Engage patient in therapeutic group addressing interpersonal concerns.  Short Term Goals: Engage patient in aftercare planning with referrals and resources, Facilitate patient progression through stages of change regarding substance use diagnoses and concerns and Identify triggers associated with mental health/substance abuse issues  Therapeutic Interventions: Assess for all discharge needs, 1 to 1 time with Social worker, Explore available resources and support systems, Assess for adequacy in community support network, Educate family and significant other(s) on suicide prevention, Complete Psychosocial Assessment, Interpersonal group therapy.  Evaluation of Outcomes: Progressing   Progress in Treatment: Attending groups: No. Participating in groups: No.New to unit. Continuing to assess.  Taking medication as prescribed: Yes. Toleration medication: Yes. Family/Significant other contact made: No, will contact:  family member if patient consents Patient understands diagnosis: Yes. Discussing patient identified problems/goals with staff: Yes. Medical problems stabilized or resolved: Yes. Denies suicidal/homicidal ideation: Yes, per self report.  Issues/concerns per patient self-inventory: No. Other: n/a  New problem(s) identified: No, Describe:  n/a  New Short Term/Long Term Goal(s): detox;  medication stabilization; development of comprehensive mental wellness/sobriety plan.   Discharge Plan or Barriers: CSW assessing for appropriate referrals. [facility] and [facility] where he stated he could not take his psychiatric medications. CSW assessing.   Reason for Continuation of Hospitalization: Depression Medication stabilization Withdrawal symptoms  Estimated Length of Stay: 3-5 days   Attendees: Patient: 08/08/2016 3:19 PM  Physician: Dr. Jama Flavorsobos MD 08/08/2016 3:19 PM  Nursing: Irven CoeJane; Christa RN 08/08/2016 3:19 PM  RN Care Manager:Jennifer Chestine Sporelark CM 08/08/2016 3:19 PM  Social Worker: Trula SladeHeather Smart, LCSW 08/08/2016 3:19 PM  Recreational Therapist: Juliann ParesX 08/08/2016 3:19 PM  Other: Armandina StammerAgnes Nwoko NP; Gray BernhardtMay Augustin NP 08/08/2016 3:19 PM  Other:  08/08/2016 3:19 PM  Other: 08/08/2016 3:19 PM    Scribe for Treatment Team: Ledell PeoplesHeather N Smart, LCSW 08/08/2016 3:19 PM

## 2016-08-08 NOTE — Progress Notes (Signed)
Patient ID: [name], male   DOB: 08/04/1990, 26 y.o.   MRN: [id number]  DAR: Pt. Denies SI/HI and A/V Hallucinations. He reports sleep is good, appetite is fair, energy level is low, and concentration is poor. He rates depression 7/10, hopelessness 4/10, and anxiety 10/10. He reports, "the Buspar is not working." Patient does not report any pain at this time. Support and encouragement provided to the patient. Scheduled medications administered to patient per physician's orders. Patient is minimal but cooperative. He is seen in the milieu more frequently as the day progresses. He is seen interacting with peers and staff. Q15 minute checks are maintained for safety.

## 2016-08-08 NOTE — [facility] Group Notes (Signed)
[facility] LCSW Group Therapy  08/08/2016 11:45 AM  Type of Therapy:  Group Therapy  Participation Level:  Active  Participation Quality:  Attentive  Affect:  Appropriate  Cognitive:  Alert and Oriented  Insight:  Improving  Engagement in Therapy:  Improving  Modes of Intervention:  Confrontation, Discussion, Education, Exploration, Socialization and Support  Summary of Progress/Problems: Today's Topic: Overcoming Obstacles. Patients identified one short term goal and potential obstacles in reaching this goal. Patients processed barriers involved in overcoming these obstacles. Patients identified steps necessary for overcoming these obstacles and explored motivation (internal and external) for facing these difficulties head on.   Alexsis Kathman N Smart LCSW 08/08/2016, 11:45 AM

## 2016-08-08 NOTE — [facility] Suicide Risk Assessment (Signed)
[facility] INPATIENT:  Family/Significant Other Suicide Prevention Education  Suicide Prevention Education:  Patient Refusal for Family/Significant Other Suicide Prevention Education: The patient [name] has refused to provide written consent for family/significant other to be provided Family/Significant Other Suicide Prevention Education during admission and/or prior to discharge.  Physician notified.  SPE completed with pt, as pt refused to consent to family contact. SPI pamphlet provided to pt and pt was encouraged to share information with support network, ask questions, and talk about any concerns relating to SPE. Pt denies access to guns/firearms and verbalized understanding of information provided. Mobile Crisis information also provided to pt.   Ledell PeoplesHeather N Smart LCSW 08/08/2016, 3:43 PM

## 2016-08-08 NOTE — Progress Notes (Signed)
Pt did not attend the evening AA speaker meeting. Pt remained in bed. Nhia Heaphy C, NT 08/08/16 8:29 PM  

## 2016-08-08 NOTE — Progress Notes (Addendum)
Patient ID: [name], male   DOB: 06/29/1990, 26 y.o.   MRN: [id number]  Pt currently presents with a masked affect and anxious behavior. Pt reports to writer that their goal is to "get started on Seroquel and another antidepressant." Pt seen interacting with peers tonight, flirtatious with male cohorts. Pt reports poor sleep with current medication regimen.   Pt provided with medications per providers orders. Pt's labs and vitals were monitored throughout the night. Pt given a 1:1 about emotional and mental status. Pt supported and encouraged to express concerns and questions. Provider notified of patients concerns about medications, see MAR. Pt educated on medications, specifically Seroquel.   Pt's safety ensured with 15 minute and environmental checks. Pt currently denies SI/HI and auditory hallucinations. Pt endorses visual hallucinations of shadows that look like "sour patch kids." Pt verbally agrees to seek staff if SI/HI or AH occurs or VH worsens and to consult with staff before acting on any harmful thoughts. Will continue POC.

## 2016-08-08 NOTE — Tx Team (Addendum)
Initial Treatment Plan 08/08/2016 1:30 AM [name] XBJ:[id number]    PATIENT STRESSORS: Financial difficulties Loss of home Medication change or noncompliance Substance abuse   PATIENT STRENGTHS: Ability for insight Capable of independent living General fund of knowledge Physical Health Supportive family/friends   PATIENT IDENTIFIED PROBLEMS: Risk for suicide  depression  "homeless"  anxiety  "help with coping skills"  SA (meth, THC)           DISCHARGE CRITERIA:  Adequate post-discharge living arrangements Improved stabilization in mood, thinking, and/or behavior Verbal commitment to aftercare and medication compliance  PRELIMINARY DISCHARGE PLAN: Attend aftercare/continuing care group Attend PHP/IOP Outpatient therapy Placement in alternative living arrangements  PATIENT/FAMILY INVOLVEMENT: This treatment plan has been presented to and reviewed with the patient, [name].  The patient and family have been given the opportunity to ask questions and make suggestions.  Delos HaringPhillips, Michael A, RN 08/08/2016, 1:30 AM
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
## 2016-08-08 NOTE — Progress Notes (Signed)
Recreation Therapy Notes  Date: 08/08/16 Time: 0930 Location: [address] Dayroom  Group Topic: Stress Management  Goal Area(s) Addresses:  Patient will verbalize importance of using healthy stress management.  Patient will identify positive emotions associated with healthy stress management.   Intervention: Stress Management  Activity :  [place].  LRT introduced the stress management technique of guided imagery.  LRT read a script that allowed patients to take a "mental vacation" by envisioning themselves in a meadow.  Patients were to follow along with the LRT as the script was read to engage in the technique.  Education:  Stress Management, Discharge Planning.   Education Outcome: Acknowledges edcuation/In group clarification offered/Needs additional education  Clinical Observations/Feedback: Pt did not attend group.    Caroll RancherMarjette Anneke Cundy, LRT/CTRS         Caroll RancherLindsay, Krystall Kruckenberg A 08/08/2016 11:44 AM

## 2016-08-08 NOTE — H&P (Signed)
Psychiatric Admission Assessment Adult  Patient Identification: [name]  MRN:  [id number]  Date of Evaluation:  08/08/2016  Chief Complaint: Suicidal ideations.  Principal Diagnosis: major depressive disorder, Polysubstance (including opioids) dependence with physiol dependence ([place])  Diagnosis:   Patient Active Problem List   Diagnosis Date Noted  . Polysubstance (including opioids) dependence with physiol dependence ([place]) [F19.20] 08/23/2014    Priority: High  . Recurrent major depression-severe ([place]) [F33.2] 08/07/2016  . Substance induced mood disorder ([place]) [F19.94]   . Atypical depression [F32.89]   . Sigmoid diverticulitis s/p lap assisted sigmoid colectomy 07/01/14 [K57.32] 07/01/2014  . Diverticulitis [K57.92] 12/06/2013  . Diverticulitis of large intestine without perforation or abscess without bleeding [K57.32] 12/06/2013  . Obesity, unspecified [E66.9] 12/06/2013  . Acute diverticulitis [K57.92] 12/06/2013  . NAUSEA AND VOMITING [R11.2] 02/09/2009  . OTHER SYMPTOMS INVOLVING DIGESTIVE SYSTEM OTHER [R19.8] 02/09/2009   History of Present Illness: This is one of several admission assessments for this 26 year old Caucasian male with hx of polysubstance dependence, ADHD & Major depressive disorder. Admitted to the [facility] adult unit as a walk-in with complaints of suicidal ideations after an urtication with girlfriend. Prior reasons for his admission to this hospital has been related to drug addiction & intoxications. During this admission, [name] reports, "I just became suicidal yesterday. A lot is going on at my home. I lost my place of resident. I just found out that I have a baby on the way. I got into an argument with my girlfriend. She kicked me out of the home. That was why the suicidal thoughts kicked in. This is the only time that I'm in this hospital that is not drug related. I have been depressed prior to this event anyway. I stay depressed, but I don't know the  reasons. I was recently diagnosed with having Schizophrenia & bipolar disorder at [facility]. I'm not on any medication for it, not that I believed that I have Bipolar disorder or Schizophrenia. I had gone to [facility] because my mind was not right. I see shadows & hear random sounds. Other times, I will see cabbage patch kids. The way my mind is these days is not drug induced because I used Meth last about three weeks ago. I have not used heroin since my last hospitalization hear. I was on Celexa, I stopped it because it is not working. I need to get on medication for my mental health".  Associated Signs/Symptoms:  Depression Symptoms:  depressed mood, hopelessness, anxiety, weight loss,  (Hypo) Manic Symptoms:  Labiality of Mood,  Anxiety Symptoms:  Excessive Worry,  Psychotic Symptoms:  Admits seeing shadows & hearing random sounds.  PTSD Symptoms: Denies any PTSD events or symptoms.  Total Time spent with patient: 1 hour  Past Psychiatric History: 1) ADD, Polysubstance dependence.                                                 2) Substance Induced Mood Disorder.  Is the patient at risk to self? Yes.    Has the patient been a risk to self in the past 6 months? No.  Has the patient been a risk to self within the distant past? No.  Is the patient a risk to others? No.  Has the patient been a risk to others in the past 6 months? No.  Has  the patient been a risk to others within the distant past? No.   Prior Inpatient Therapy: Prior Inpatient Therapy: Yes Prior Therapy Dates: 11/30/15 Prior Therapy Facilty/Provider(s): [facility] Reason for Treatment: Substance Abuse  Prior Outpatient Therapy: Prior Outpatient Therapy: Yes [facility]) Prior Therapy Dates: 07/07/16 Prior Therapy Facilty/Provider(s): [facility] Reason for Treatment: Depression/SA Does patient have an ACCT team?: No Does patient have Intensive In-House Services?  : No Does patient have [facility] services? : Yes Does patient have  P4CC services?: No  Alcohol Screening: 1. How often do you have a drink containing alcohol?: Monthly or less 2. How many drinks containing alcohol do you have on a typical day when you are drinking?: 1 or 2 3. How often do you have six or more drinks on one occasion?: Less than monthly Preliminary Score: 1 4. How often during the last year have you found that you were not able to stop drinking once you had started?: Never 5. How often during the last year have you failed to do what was normally expected from you becasue of drinking?: Never 6. How often during the last year have you needed a first drink in the morning to get yourself going after a heavy drinking session?: Never 7. How often during the last year have you had a feeling of guilt of remorse after drinking?: Never 8. How often during the last year have you been unable to remember what happened the night before because you had been drinking?: Never 9. Have you or someone else been injured as a result of your drinking?: No 10. Has a relative or friend or a doctor or another health worker been concerned about your drinking or suggested you cut down?: No Alcohol Use Disorder Identification Test Final Score (AUDIT): 2 Brief Intervention: AUDIT score less than 7 or less-screening does not suggest unhealthy drinking-brief intervention not indicated  Substance Abuse History in the last 12 months:  Yes.    Consequences of Substance Abuse: Medical Consequences:  Liver damage, Possible death by overdose Legal Consequences:  Arrests, jail time, Loss of driving privilege. Family Consequences:  Family discord, divorce and or separation.  Previous Psychotropic Medications: Yes   Psychological Evaluations: Yes   Past Medical History:  Past Medical History:  Diagnosis Date  . Anxiety   . Attention deficit disorder   . Depression   . Diverticulitis   . Hiccups    pt states hiccups during sleep     Past Surgical History:  Procedure  Laterality Date  . colonscopy      removed polyps   . LAPAROSCOPIC PARTIAL COLECTOMY N/A 07/01/2014   Procedure: LAPAROSCOPIC ASSISTED SIGMOID COLECTOMY WITH MOBILIZATION OF SPLENIC FLEXURE;  Surgeon: Jackolyn Confer, MD;  Location: [facility];  Service: General;  Laterality: N/A;   Family History: History reviewed. No pertinent family history.  Family Psychiatric  History: Opioid dependence, Cocaine abuse, Major depression  Tobacco Screening: Dips Tobacco  Social History:  History  Alcohol Use  . Yes    Comment: occasionally     History  Drug Use  . Types: "Crack" cocaine, Heroin, Methamphetamines, Marijuana, Cocaine    Comment: clean since 11/30/15    Additional Social History: Marital status: Single  Name of Substance 1: Methamphetamine 1 - Age of First Use: 15 1 - Amount (size/oz): 1 oz 1 - Frequency: daily 1 - Duration: 2 weeks (Pt reports using from 19 to 11/29/16...relapse three wks ago) 1 - Last Use / Amount: 07/31/16 Name of Substance 2: Cannabis 2 -  Age of First Use: 15 2 - Amount (size/oz): 3 blunts daily 2 - Frequency: daily 2 - Duration: Since age of 60 2 - Last Use / Amount: 08/06/16 Name of Substance 3: Heroin 3 - Age of First Use: 15 and started regularly at 80 3 - Amount (size/oz): 1-2 grams 3 - Frequency: daily 3 - Duration: since 26 years old 3 - Last Use / Amount: 11/30/15 Name of Substance 4: Tobacco 4 - Age of First Use: 14 4 - Amount (size/oz): 1 pack daily 4 - Frequency: daily 4 - Duration: since 15 4 - Last Use / Amount: 08/07/16  Allergies:   Allergies  Allergen Reactions  . Cephalexin Anaphylaxis   Lab Results:  Results for orders placed or performed during the hospital encounter of 08/07/16 (from the past 48 hour(s))  CBC     Status: None   Collection Time: 08/08/16  6:05 AM  Result Value Ref Range   WBC 8.0 4.0 - 10.5 K/uL   RBC 5.25 4.22 - 5.81 MIL/uL   Hemoglobin 15.2 13.0 - 17.0 g/dL   HCT 44.7 39.0 - 52.0 %   MCV 85.1 78.0 - 100.0  fL   MCH 29.0 26.0 - 34.0 pg   MCHC 34.0 30.0 - 36.0 g/dL   RDW 13.7 11.5 - 15.5 %   Platelets 239 150 - 400 K/uL    Comment: Performed at [facility], [place] [address] [place], [place] [address]  Comprehensive metabolic panel     Status: None   Collection Time: 08/08/16  6:05 AM  Result Value Ref Range   Sodium 140 135 - 145 mmol/L   Potassium 4.2 3.5 - 5.1 mmol/L   Chloride 103 101 - 111 mmol/L   CO2 30 22 - 32 mmol/L   Glucose, Bld 92 65 - 99 mg/dL   BUN 10 6 - 20 mg/dL   Creatinine, Ser 1.03 0.61 - 1.24 mg/dL   Calcium 9.4 8.9 - 10.3 mg/dL   Total Protein 7.1 6.5 - 8.1 g/dL   Albumin 4.1 3.5 - 5.0 g/dL   AST 18 15 - 41 U/L   ALT 24 17 - 63 U/L   Alkaline Phosphatase 58 38 - 126 U/L   Total Bilirubin 0.4 0.3 - 1.2 mg/dL   GFR calc non Af Amer >60 >60 mL/min   GFR calc Af Amer >60 >60 mL/min    Comment: (NOTE) The eGFR has been calculated using the CKD EPI equation. This calculation has not been validated in all clinical situations. eGFR's persistently <60 mL/min signify possible Chronic Kidney Disease.    Anion gap 7 5 - 15    Comment: Performed at [facility], [place] [address] [place], [place] [address]  Lipid panel     Status: Abnormal   Collection Time: 08/08/16  6:05 AM  Result Value Ref Range   Cholesterol 178 0 - 200 mg/dL   Triglycerides 118 <150 mg/dL   HDL 52 >40 mg/dL   Total CHOL/HDL Ratio 3.4 RATIO   VLDL 24 0 - 40 mg/dL   LDL Cholesterol 102 (H) 0 - 99 mg/dL    Comment:        Total Cholesterol/HDL:CHD Risk Coronary Heart Disease Risk Table                     Men   Women  1/2 Average Risk   3.4   3.3  Average Risk       5.0  4.4  2 X Average Risk   9.6   7.1  3 X Average Risk  23.4   11.0        Use the calculated Patient Ratio above and the CHD Risk Table to determine the patient's CHD Risk.        ATP III CLASSIFICATION (LDL):  <100     mg/dL   Optimal  100-129  mg/dL   Near or Above                     Optimal  130-159  mg/dL   Borderline  160-189  mg/dL   High  >190     mg/dL   Very High Performed at [facility] [address] [place], [place] [address]   TSH     Status: None   Collection Time: 08/08/16  6:05 AM  Result Value Ref Range   TSH 0.935 0.350 - 4.500 uIU/mL    Comment: Performed by a 3rd Generation assay with a functional sensitivity of <=0.01 uIU/mL. Performed at [facility], [place] [address], [place], [place] [address]    Blood Alcohol level:  Lab Results  Component Value Date   ETH <5 62/13/0865   Metabolic Disorder Labs:  Lab Results  Component Value Date   HGBA1C 5.0 12/01/2015   MPG 97 12/01/2015   No results found for: PROLACTIN Lab Results  Component Value Date   CHOL 178 08/08/2016   TRIG 118 08/08/2016   HDL 52 08/08/2016   CHOLHDL 3.4 08/08/2016   VLDL 24 08/08/2016   LDLCALC 102 (H) 08/08/2016   LDLCALC 60 12/01/2015   Current Medications: Current Facility-Administered Medications  Medication Dose Route Frequency Provider Last Rate Last Dose  . acetaminophen (TYLENOL) tablet 650 mg  650 mg Oral Q6H PRN Rozetta Nunnery, NP      . alum & mag hydroxide-simeth (MAALOX/MYLANTA) 200-200-20 MG/5ML suspension 20 mL  20 mL Oral Q6H PRN Rozetta Nunnery, NP      . busPIRone (BUSPAR) tablet 5 mg  5 mg Oral TID Rozetta Nunnery, NP   5 mg at 08/08/16 7846  . citalopram (CELEXA) tablet 20 mg  20 mg Oral Daily Rozetta Nunnery, NP   20 mg at 08/08/16 9629  . hydrOXYzine (ATARAX/VISTARIL) tablet 25 mg  25 mg Oral TID PRN Rozetta Nunnery, NP   25 mg at 08/08/16 0050  . [START ON 08/09/2016] Influenza vac split quadrivalent PF (FLUARIX) injection 0.5 mL  0.5 mL Intramuscular Tomorrow-1000 Fernando A Cobos, MD      . magnesium hydroxide (MILK OF MAGNESIA) suspension 30 mL  30 mL Oral Daily PRN Rozetta Nunnery, NP      . nicotine (NICODERM CQ - dosed in mg/24 hours) patch 21 mg  21 mg Transdermal Daily Rozetta Nunnery, NP   21 mg at 08/08/16 5284  .  [START ON 08/09/2016] pneumococcal 23 valent vaccine (PNU-IMMUNE) injection 0.5 mL  0.5 mL Intramuscular Tomorrow-1000 Myer Peer Cobos, MD      . traZODone (DESYREL) tablet 50 mg  50 mg Oral QHS PRN Rozetta Nunnery, NP   50 mg at 08/08/16 0050   PTA Medications: Prescriptions Prior to Admission  Medication Sig Dispense Refill Last Dose  . citalopram (CELEXA) 20 MG tablet Take 1 tablet (20 mg total) by mouth daily. 30 tablet 0 Past Month at Unknown time  . traZODone (DESYREL) 50 MG tablet Take 1 tablet (50 mg  total) by mouth at bedtime as needed for sleep. (Patient taking differently: Take 100 mg by mouth at bedtime as needed for sleep. ) 30 tablet 0 Past Month at Unknown time  . [DISCONTINUED] ciprofloxacin (CIPRO) 500 MG tablet Take 1 tablet (500 mg total) by mouth 2 (two) times daily. 20 tablet 0   . [DISCONTINUED] hydrocortisone 2.5 % lotion Apply topically 2 (two) times daily. 118 mL 0   . [DISCONTINUED] ondansetron (ZOFRAN ODT) 4 MG disintegrating tablet Take 1 tablet (4 mg total) by mouth every 8 (eight) hours as needed for nausea or vomiting. 20 tablet 0    Musculoskeletal: Strength & Muscle Tone: within normal limits Gait & Station: normal Patient leans: N/A  Psychiatric Specialty Exam: Physical Exam  Nursing note and vitals reviewed. Constitutional: He is oriented to person, place, and time. Vital signs are normal. He appears well-developed and well-nourished.  Non-toxic appearance. He does not have a sickly appearance.  HENT:  Head: Normocephalic.  Eyes: Pupils are equal, round, and reactive to light.  Neck: Normal range of motion.  Cardiovascular: Normal rate and regular rhythm.   Pulses:      Radial pulses are 2+ on the right side, and 2+ on the left side.  Respiratory: Effort normal.  GI: Soft. Normal appearance.  Genitourinary:  Genitourinary Comments: Denies any issues in this area  Musculoskeletal: Normal range of motion.  Neurological: He is alert and oriented to  person, place, and time.  Skin: Skin is warm and dry.  Psychiatric: His speech is normal and behavior is normal. Judgment and thought content normal. His mood appears anxious. Cognition and memory are normal.    Review of Systems  Constitutional: Positive for chills, diaphoresis and malaise/fatigue. Negative for fever.  Eyes: Negative.   Respiratory: Negative.   Cardiovascular: Negative.   Gastrointestinal: Positive for abdominal pain and nausea.  Genitourinary: Negative.   Musculoskeletal: Positive for joint pain and myalgias. Negative for back pain and neck pain.  Skin: Negative.   Neurological: Positive for dizziness, tremors, weakness and headaches.  Endo/Heme/Allergies: Negative.   Psychiatric/Behavioral: Positive for depression and substance abuse (Opioid dependence, Cocaine dependence). Negative for hallucinations, memory loss and suicidal ideas. The patient is nervous/anxious and has insomnia.   All other systems reviewed and are negative.   Blood pressure 123/76, pulse 76, temperature 98 F (36.7 C), temperature source Oral, resp. rate 18, height '6\' 1"'  (1.854 m), weight 109.3 kg (241 lb), SpO2 97 %.Body mass index is 31.8 kg/m.  General Appearance: Casual,   Eye Contact:  Fair  Speech:  Clear and Coherent  Volume:  Normal  Mood:  Anxious and Depressed  Affect:  Appropriate  Thought Process:  Coherent and Goal Directed  Orientation:  Full (Time, Place, and Person)  Thought Content:  Hallucinations: Auditory Visual and Rumination: (Says he sees shadows).  Suicidal Thoughts:  Admits passive suicidal ideations without plans or intent.  Homicidal Thoughts:  Denies any thoughts, plans or intent  Memory:  Immediate;   Good Recent;   Good Remote;   Good  Judgement:  Intact  Insight:  Present  Psychomotor Activity:  Admits high anxiety levels,   Concentration:  Concentration: Fair and Attention Span: Fair  Recall:  [employer] of Knowledge:  Good  Language:  Good  Akathisia:   Negative  Handed:  Right  AIMS (if indicated):     Assets:  Desire for Improvement Resilience  ADL's:  Intact  Cognition:  WNL  Sleep:  Number of Hours:  3.75   Treatment Plan Summary: Daily contact with patient to assess and evaluate symptoms and progress in treatment and Medication management: 1. Admit for crisis management and stabilization, estimated length of stay 3-5 days.  2. Medication management to reduce current symptoms to base line and improve the patient's overall level of functioning: See MAR.   3. Treat health problems as indicated.  4. Develop treatment plan to decrease risk of relapse upon discharge and the need for readmission.  5. Psycho-social education regarding relapse prevention and self care.  6. Health care follow up as needed for medical problems.  7. Review, reconcile, and reinstate any pertinent home medications for other health issues where appropriate. 8. Call for consults with hospitalist for any additional specialty patient care services as needed.  Observation Level/Precautions:  15 minute checks  Laboratory: Per ED, UDS positive for Opioid & cocaine  Psychotherapy: Group sessions  Medications: See MAR   Consultations: As needed  Discharge Concerns: Maintaining sobriety, mood stability.  Estimated LOS: 3-5 days  Other: Admit to [facility].   I certify that inpatient services furnished can reasonably be expected to improve the patient's condition.    Encarnacion Slates, NP, PMHMP, FNP-BC. 3/26/201811:22 AM

## 2016-08-08 NOTE — Progress Notes (Signed)
Psychoeducational Group Note  Date:  08/08/2016 Time:  0111  Group Topic/Focus:  Wrap-Up Group:   The focus of this group is to help patients review their daily goal of treatment and discuss progress on daily workbooks.  Participation Level: Did Not Attend  Participation Quality:  Not Applicable  Affect:  Not Applicable  Cognitive:  Not Applicable  Insight:  Not Applicable  Engagement in Group: Not Applicable  Additional Comments:  The patient was unable to attend the evening A.A.meeting since he was not admitted to the hallway until hours later.   Mignon Bechler S 08/08/2016, 1:11 AM

## 2016-08-08 NOTE — Progress Notes (Signed)
Admission Note:  D:25 yr male who presents [facility] in no acute distress for the treatment of SI and Depression. Pt appears flat and depressed. Pt was calm and cooperative with admission process. Pt presents with passive SI, VH-shadows and contracts for safety upon admission. Pt denies AH . Pt [facility] but relapsed on Meth 4 weeks ago, but has been clean the last week. Pt stated the rehab facility he went to was faith based and didn't allow him to take his medications. Pt stated he was homeless due to his girlfriend kicking him out while telling him he is going to be a father. Pt [facility] and [facility] through [facility], pt stated his parents plan to be instrumental in helping pt moving forward with his plans.   A:Skin was assessed and found to be clear of any abnormal marks apart from a tattoo on R-elbow. PT searched and no contraband found, POC and unit policies explained and understanding verbalized. Consents obtained.  R: Food and fluids offered, and  accepted. Pt had no additional questions or concerns.

## 2016-08-08 NOTE — Progress Notes (Signed)
Urine cup given. Pt. informed. Pending sample/lab results.

## 2016-08-09 DIAGNOSIS — F1994 Other psychoactive substance use, unspecified with psychoactive substance-induced mood disorder: Secondary | ICD-10-CM

## 2016-08-09 DIAGNOSIS — F4310 Post-traumatic stress disorder, unspecified: Secondary | ICD-10-CM

## 2016-08-09 LAB — HEMOGLOBIN A1C
Hgb A1c MFr Bld: 5 % (ref 4.8–5.6)
MEAN PLASMA GLUCOSE: 97 mg/dL

## 2016-08-09 MED ORDER — PRAZOSIN HCL 2 MG PO CAPS
2.0000 mg | ORAL_CAPSULE | Freq: Every day | ORAL | Status: DC
  Administered 2016-08-09 – 2016-08-11 (×3): 2 mg via ORAL
  Filled 2016-08-09: qty 2
  Filled 2016-08-09 (×2): qty 1
  Filled 2016-08-09: qty 7
  Filled 2016-08-09 (×2): qty 1

## 2016-08-09 MED ORDER — SERTRALINE HCL 50 MG PO TABS
50.0000 mg | ORAL_TABLET | Freq: Every day | ORAL | Status: DC
  Administered 2016-08-10: 50 mg via ORAL
  Filled 2016-08-09 (×2): qty 1

## 2016-08-09 MED ORDER — QUETIAPINE FUMARATE 200 MG PO TABS
200.0000 mg | ORAL_TABLET | Freq: Every day | ORAL | Status: DC
  Administered 2016-08-09 – 2016-08-10 (×2): 200 mg via ORAL
  Filled 2016-08-09 (×4): qty 1

## 2016-08-09 NOTE — [facility] Group Notes (Signed)
The focus of this group is to educate the patient on the purpose and policies of crisis stabilization and provide a format to answer questions about their admission.  The group details unit policies and expectations of patients while admitted.  Patient did not attend 0900 nurse education orientation group this morning.  Patient stayed in room.  

## 2016-08-09 NOTE — Progress Notes (Signed)
D: Patient asleep until mid morning. Slow to awaken. Spoke with patient 1:1. Rating depression at an 8/10, hopelessness at a 3/10 and anxiety at a 5/10. Rates sleep as good, appetite as good, energy as low and concentration as poor. Patient's affect flat, anxious, depressed with congruent mood. States goal for today is to "get meds right." Denies pain, physical problems. Endorsing VH in the form of shadows.  A: Medicated per orders, vistaril prn per his request. Emotional support offered and self inventory reviewed. Encouraged completion of Suicide Safety Plan. Discussed POC with MD, SW.    R: Patient verbalizes understanding of POC. On reassess, patient calmer. Patient denies SI/HI and remains safe on level III obs.

## 2016-08-09 NOTE — [facility] Group Notes (Signed)
[facility] LCSW Group Therapy  08/09/2016 2:46 PM  Type of Therapy:  Group Therapy  Participation Level:  Active  Participation Quality:  Attentive  Affect:  Appropriate  Cognitive:  Alert and Oriented  Insight:  Improving  Engagement in Therapy:  Engaged  Modes of Intervention:  Discussion, Education, Exploration, Socialization and Support  Summary of Progress/Problems: MHA Speaker came to talk about his personal journey with substance abuse and addiction. The pt processed ways by which to relate to the speaker. MHA speaker provided handouts and educational information pertaining to groups and services offered by [facility].   Nelia Rogoff N Smart LCSW 08/09/2016, 2:46 PM

## 2016-08-09 NOTE — Progress Notes (Signed)
[facility] MD Progress Note  08/09/2016 3:22 PM [name]  MRN:  [id number] Subjective:   26 yo Caucasian male, single. Background history of SUD, Substance related psychosis and mood disorder. Patient presented to the ER in company of his brother and his father. He had recently gotten into a fight with his girlfriend. She evicted him from their home. Patient reported homicidal and suicidal thoughts at presentation. His UDS was pos1tive for cocaine and THC.  Nursing staff reports that he has not been observed to be internally stimulated. He has been engaging with unit groups and activities. No behavioral issues.  At interview, patient reports long history of substance use. Says opiates used to be his drug of choice. Says he has stayed away from that for many years. Patient says he did a lot of methamphetamine about two weeks ago. Says his girlfriend saw him interacting with another male addict. Patient states that his girlfriend had a different perception of that interaction. They got into a fight and she ended their relationship. Patient says the day before he presented here, she then kicked him out of the house. patient says he was very upset then. Says he felt like harming her and then harm himself then. Says he has had time to calm down. He is no longer entertaining any suicidal or homicidal thoughts. Says he wants to get better and stay away from substances. His goal is to get into [facility] and do outpatient counseling. Patient recently found out that his exgirlfriend is pregnant. He is not distressed by this. Patient reports history traumatic experience around the drug culture. Says he has nightmares of traumatic events while dealing with drugs. He has consented to trial of Prazosin after we reviewed the risks and benefits. He also wants to switch his antidepressant to Sertraline.   Principal Problem: Substance induced mood disorder ([place]) Diagnosis:   Patient Active Problem List   Diagnosis  Date Noted  . Recurrent major depression-severe ([place]) [F33.2] 08/07/2016  . Substance induced mood disorder ([place]) [F19.94]   . Atypical depression [F32.89]   . Polysubstance (including opioids) dependence with physiol dependence ([place]) [F19.20] 08/23/2014  . Sigmoid diverticulitis s/p lap assisted sigmoid colectomy 07/01/14 [K57.32] 07/01/2014  . Diverticulitis [K57.92] 12/06/2013  . Diverticulitis of large intestine without perforation or abscess without bleeding [K57.32] 12/06/2013  . Obesity, unspecified [E66.9] 12/06/2013  . Acute diverticulitis [K57.92] 12/06/2013  . NAUSEA AND VOMITING [R11.2] 02/09/2009  . OTHER SYMPTOMS INVOLVING DIGESTIVE SYSTEM OTHER [R19.8] 02/09/2009   Total Time spent with patient: 30 minutes  Past Psychiatric History: As in H&P  Past Medical History:  Past Medical History:  Diagnosis Date  . Anxiety   . Attention deficit disorder   . Depression   . Diverticulitis   . Hiccups    pt states hiccups during sleep     Past Surgical History:  Procedure Laterality Date  . colonscopy      removed polyps   . LAPAROSCOPIC PARTIAL COLECTOMY N/A 07/01/2014   Procedure: LAPAROSCOPIC ASSISTED SIGMOID COLECTOMY WITH MOBILIZATION OF SPLENIC FLEXURE;  Surgeon: Jackolyn Confer, MD;  Location: [facility];  Service: General;  Laterality: N/A;   Family History: History reviewed. No pertinent family history. Family Psychiatric  History: As in H&P Social History:  History  Alcohol Use  . Yes    Comment: occasionally     History  Drug Use  . Types: "Crack" cocaine, Heroin, Methamphetamines, Marijuana, Cocaine    Comment: clean since 11/30/15    Social History  Social History  . Marital status: Single    Spouse name: N/A  . Number of children: N/A  . Years of education: N/A   Social History Main Topics  . Smoking status: Never Smoker  . Smokeless tobacco: Current User    Types: Chew  . Alcohol use Yes     Comment: occasionally  . Drug use: Yes    Types:  "Crack" cocaine, Heroin, Methamphetamines, Marijuana, Cocaine     Comment: clean since 11/30/15  . Sexual activity: Yes    Birth control/ protection: Condom   Other Topics Concern  . None   Social History Narrative  . None   Additional Social History:      Name of Substance 1: Methamphetamine 1 - Age of First Use: 15 1 - Amount (size/oz): 1 oz 1 - Frequency: daily 1 - Duration: 2 weeks (Pt reports using from 19 to 11/29/16...relapse three wks ago) 1 - Last Use / Amount: 07/31/16 Name of Substance 2: Cannabis 2 - Age of First Use: 15 2 - Amount (size/oz): 3 blunts daily 2 - Frequency: daily 2 - Duration: Since age of 39 2 - Last Use / Amount: 08/06/16 Name of Substance 3: Heroin 3 - Age of First Use: 15 and started regularly at 52 3 - Amount (size/oz): 1-2 grams 3 - Frequency: daily 3 - Duration: since 26 years old 3 - Last Use / Amount: 11/30/15 Name of Substance 4: Tobacco 4 - Age of First Use: 14 4 - Amount (size/oz): 1 pack daily 4 - Frequency: daily 4 - Duration: since 15 4 - Last Use / Amount: 08/07/16            Sleep: Broken due to nightmares  Appetite:  Good  Current Medications: Current Facility-Administered Medications  Medication Dose Route Frequency Provider Last Rate Last Dose  . acetaminophen (TYLENOL) tablet 650 mg  650 mg Oral Q6H PRN Rozetta Nunnery, NP      . alum & mag hydroxide-simeth (MAALOX/MYLANTA) 200-200-20 MG/5ML suspension 20 mL  20 mL Oral Q6H PRN Rozetta Nunnery, NP      . citalopram (CELEXA) tablet 20 mg  20 mg Oral Daily Rozetta Nunnery, NP   20 mg at 08/08/16 5397  . hydrOXYzine (ATARAX/VISTARIL) tablet 25 mg  25 mg Oral TID PRN Rozetta Nunnery, NP   25 mg at 08/09/16 1019  . magnesium hydroxide (MILK OF MAGNESIA) suspension 30 mL  30 mL Oral Daily PRN Rozetta Nunnery, NP      . nicotine (NICODERM CQ - dosed in mg/24 hours) patch 21 mg  21 mg Transdermal Daily Rozetta Nunnery, NP   21 mg at 08/09/16 1018  . QUEtiapine (SEROQUEL) tablet 100 mg   100 mg Oral QHS,MR X 1 Laverle Hobby, PA-C   100 mg at 08/08/16 2251  . traZODone (DESYREL) tablet 50 mg  50 mg Oral QHS PRN Rozetta Nunnery, NP   50 mg at 08/08/16 2251    Lab Results:  Results for orders placed or performed during the hospital encounter of 08/07/16 (from the past 48 hour(s))  Urine rapid drug screen (hosp performed)not at [facility]     Status: Abnormal   Collection Time: 08/08/16  5:00 AM  Result Value Ref Range   Opiates NONE DETECTED NONE DETECTED   Cocaine POSITIVE (A) NONE DETECTED   Benzodiazepines NONE DETECTED NONE DETECTED   Amphetamines NONE DETECTED NONE DETECTED   Tetrahydrocannabinol POSITIVE (A) NONE DETECTED   Barbiturates  NONE DETECTED NONE DETECTED    Comment:        DRUG SCREEN FOR MEDICAL PURPOSES ONLY.  IF CONFIRMATION IS NEEDED FOR ANY PURPOSE, NOTIFY LAB WITHIN 5 DAYS.        LOWEST DETECTABLE LIMITS FOR URINE DRUG SCREEN Drug Class       Cutoff (ng/mL) Amphetamine      1000 Barbiturate      200 Benzodiazepine   829 Tricyclics       562 Opiates          300 Cocaine          300 THC              50 Performed at [facility], [place] [address] [place], [place] [address]   CBC     Status: None   Collection Time: 08/08/16  6:05 AM  Result Value Ref Range   WBC 8.0 4.0 - 10.5 K/uL   RBC 5.25 4.22 - 5.81 MIL/uL   Hemoglobin 15.2 13.0 - 17.0 g/dL   HCT 44.7 39.0 - 52.0 %   MCV 85.1 78.0 - 100.0 fL   MCH 29.0 26.0 - 34.0 pg   MCHC 34.0 30.0 - 36.0 g/dL   RDW 13.7 11.5 - 15.5 %   Platelets 239 150 - 400 K/uL    Comment: Performed at [facility], [place] [address], [place], [place] [address]  Comprehensive metabolic panel     Status: None   Collection Time: 08/08/16  6:05 AM  Result Value Ref Range   Sodium 140 135 - 145 mmol/L   Potassium 4.2 3.5 - 5.1 mmol/L   Chloride 103 101 - 111 mmol/L   CO2 30 22 - 32 mmol/L   Glucose, Bld 92 65 - 99 mg/dL   BUN 10 6 - 20 mg/dL   Creatinine, Ser 1.03 0.61 - 1.24  mg/dL   Calcium 9.4 8.9 - 10.3 mg/dL   Total Protein 7.1 6.5 - 8.1 g/dL   Albumin 4.1 3.5 - 5.0 g/dL   AST 18 15 - 41 U/L   ALT 24 17 - 63 U/L   Alkaline Phosphatase 58 38 - 126 U/L   Total Bilirubin 0.4 0.3 - 1.2 mg/dL   GFR calc non Af Amer >60 >60 mL/min   GFR calc Af Amer >60 >60 mL/min    Comment: (NOTE) The eGFR has been calculated using the CKD EPI equation. This calculation has not been validated in all clinical situations. eGFR's persistently <60 mL/min signify possible Chronic Kidney Disease.    Anion gap 7 5 - 15    Comment: Performed at [facility], [place] [address] [place], [place] [address]  Hemoglobin A1c     Status: None   Collection Time: 08/08/16  6:05 AM  Result Value Ref Range   Hgb A1c MFr Bld 5.0 4.8 - 5.6 %    Comment: (NOTE)         Pre-diabetes: 5.7 - 6.4         Diabetes: >6.4         Glycemic control for adults with diabetes: <7.0    Mean Plasma Glucose 97 mg/dL    Comment: (NOTE) Performed At: [facility] [place], [place] [id number] Lindon Romp MD NU:[id number] Performed at [facility], [place] [address] [place], [place] [address]   Lipid panel     Status: Abnormal   Collection Time: 08/08/16  6:05 AM  Result Value Ref Range   Cholesterol 178 0 - 200 mg/dL   Triglycerides 118 <150 mg/dL   HDL 52 >40 mg/dL   Total CHOL/HDL Ratio 3.4 RATIO   VLDL 24 0 - 40 mg/dL   LDL Cholesterol 102 (H) 0 - 99 mg/dL    Comment:        Total Cholesterol/HDL:CHD Risk Coronary Heart Disease Risk Table                     Men   Women  1/2 Average Risk   3.4   3.3  Average Risk       5.0   4.4  2 X Average Risk   9.6   7.1  3 X Average Risk  23.4   11.0        Use the calculated Patient Ratio above and the CHD Risk Table to determine the patient's CHD Risk.        ATP III CLASSIFICATION (LDL):  <100     mg/dL   Optimal  100-129  mg/dL   Near or Above                    Optimal  130-159   mg/dL   Borderline  160-189  mg/dL   High  >190     mg/dL   Very High Performed at [place] [address] [place], [place] [address]   TSH     Status: None   Collection Time: 08/08/16  6:05 AM  Result Value Ref Range   TSH 0.935 0.350 - 4.500 uIU/mL    Comment: Performed by a 3rd Generation assay with a functional sensitivity of <=0.01 uIU/mL. Performed at [facility], [place] [address] [place], [place] [address]     Blood Alcohol level:  Lab Results  Component Value Date   ETH <5 25/49/8264    Metabolic Disorder Labs: Lab Results  Component Value Date   HGBA1C 5.0 08/08/2016   MPG 97 08/08/2016   MPG 97 12/01/2015   No results found for: PROLACTIN Lab Results  Component Value Date   CHOL 178 08/08/2016   TRIG 118 08/08/2016   HDL 52 08/08/2016   CHOLHDL 3.4 08/08/2016   VLDL 24 08/08/2016   LDLCALC 102 (H) 08/08/2016   LDLCALC 60 12/01/2015    Physical Findings: AIMS: Facial and Oral Movements Muscles of Facial Expression: None, normal Lips and Perioral Area: None, normal Jaw: None, normal Tongue: None, normal,Extremity Movements Upper (arms, wrists, hands, fingers): None, normal Lower (legs, knees, ankles, toes): None, normal, Trunk Movements Neck, shoulders, hips: None, normal, Overall Severity Severity of abnormal movements (highest score from questions above): None, normal Incapacitation due to abnormal movements: None, normal Patient's awareness of abnormal movements (rate only patient's report): No Awareness, Dental Status Current problems with teeth and/or dentures?: No Does patient usually wear dentures?: No  CIWA:    COWS:  COWS Total Score: 1  Musculoskeletal: Strength & Muscle Tone: within normal limits Gait & Station: normal Patient leans: N/A  Psychiatric Specialty Exam: Physical Exam  Constitutional: He is oriented to person, place, and time. He appears well-developed and well-nourished.  HENT:  Head:  Normocephalic and atraumatic.  Eyes: Conjunctivae and EOM are normal. Pupils are equal, round, and reactive to light.  Neck: Normal range of motion. Neck supple.  Cardiovascular: Normal rate, regular rhythm and normal heart sounds.   Respiratory: Effort normal and breath sounds normal.  GI:  Soft. Bowel sounds are normal.  Musculoskeletal: Normal range of motion.  Neurological: He is alert and oriented to person, place, and time. He has normal reflexes.  Skin: Skin is warm and dry.  Psychiatric:  As above.     ROS  Blood pressure 123/76, pulse 76, temperature 98 F (36.7 C), temperature source Oral, resp. rate 18, height _0  (1.854 m), weight 109.3 kg (241 lb), SpO2 97 %.Body mass index is 31.8 kg/m.  General Appearance: Overweight, good relatedness. Calm and cooperative. Appropriate behavior. Not internally distracted.   Eye Contact:  Good  Speech:  Clear and Coherent and Normal Rate  Volume:  Normal  Mood:  Feeling better  Affect:  Appropriate and Restricted  Thought Process:  Goal Directed  Orientation:  Full (Time, Place, and Person)  Thought Content:  No delusional theme. No preoccupation with violent thoughts. No negative ruminations. No obsession.  No hallucination in any modality. He is future oriented.  Suicidal Thoughts:  No  Homicidal Thoughts:  No  Memory:  Immediate;   Good Recent;   Good Remote;   Good  Judgement:  Fair  Insight:  Partial  Psychomotor Activity:  Normal  Concentration:  Concentration: Good and Attention Span: Good  Recall:  Good  Fund of Knowledge:  Good  Language:  Good  Akathisia:  No  Handed:    AIMS (if indicated):     Assets:  Communication Skills Physical Health  ADL's:  Intact  Cognition:  WNL  Sleep:  Number of Hours: 5.5     Treatment Plan Summary: Patient is no longer raging. He is coming off psychoactive substances smoothly. No current suicidal or homicidal thoughts. We are adjusting his medications as below.     Psychiatric: SUD (Opiates, cocaine, methamphetamine, THC) Substance Induced Mood Disorder PTSD  Medical:  Psychosocial:  Recent end of his relationship Girlfriend is expecting a child  PLAN: 1. Switch Citalopram to Sertraline from tomorrow 2. Add Prazosin 2 mg HS 3. Continue to monitor mood, behavior and interaction with peers 4. Motivational enhancement  5. SW would coordinate aftercare   [place], MD 08/09/2016, 3:22 PM

## 2016-08-10 MED ORDER — SERTRALINE HCL 50 MG PO TABS
50.0000 mg | ORAL_TABLET | Freq: Every day | ORAL | Status: DC
  Administered 2016-08-11 – 2016-08-12 (×2): 50 mg via ORAL
  Filled 2016-08-10: qty 7
  Filled 2016-08-10 (×2): qty 1

## 2016-08-10 NOTE — Progress Notes (Signed)
Recreation Therapy Notes  Date: 08/10/16 Time: 0930 Location: 300 [address] Dayroom  Group Topic: Stress Management  Goal Area(s) Addresses:  Patient will verbalize importance of using healthy stress management.  Patient will identify positive emotions associated with healthy stress management.   Intervention: Stress Management  Activity :  Body Scan Meditation.  LRT introduced the stress management technique of meditation.  LRT played a meditation that allowed patients to focus on the feelings and sensations they were feeling.  Patients were to follow along as the meditation played to engage in the activity.  Education:  Stress Management, Discharge Planning.   Education Outcome: Acknowledges edcuation/In group clarification offered/Needs additional education  Clinical Observations/Feedback: Pt did not attend group.   Caroll RancherMarjette Arjuna Doeden, LRT/CTRS         Caroll RancherLindsay, Danene Montijo A 08/10/2016 12:22 PM

## 2016-08-10 NOTE — Plan of Care (Signed)
Problem: Safety: Goal: Periods of time without injury will increase Outcome: Progressing Patient denies active SI and contracts for safety on the unit. Patient safety maintained with Level III q 15 minute safety checks.   

## 2016-08-10 NOTE — Progress Notes (Signed)
Nursing Progress Note 7p-7a  D) Patient presents anxious but is cooperative. Patient is seen interactive in the milieu. Patient states "the Seroquel is really helping my outbursts. When someone says something I don't get angry, I laugh". Patient denies SI/HI/AVH or pain. Patient complains of anxiety and requests medication. Patient contracts for safety on the unit.  A) Emotional support given. 1:1 interaction and active listening provided. Patient medicated with PM orders as prescribed. Medications reviewed with patient. Patient verbalized understanding of medications without further questions.  Snacks and fluids provided. Opportunities for questions or concerns presented to patient. Patient encouraged to continue to work on treatment goals. Labs, vital signs and patient behavior monitored throughout shift. Patient safety maintained with q15 min safety checks.  R) Patient receptive to interaction with nurse. Patient remains safe on the unit at this time. Patient denies any adverse medication reactions at this time. Patient is without questions or concerns for Clinical research associatewriter. Will continue to monitor.

## 2016-08-10 NOTE — Progress Notes (Signed)
Nursing Progress Note: 7p-7a D: Pt currently presents with a pleasant/anxious affect and behavior. Pt states "today was a good day." Interacting appropriately with milieu. Pt reports good sleep with current medication regimen.   A: Pt provided with medications per providers orders. Pt's labs and vitals were monitored throughout the night. Pt supported emotionally and encouraged to express concerns and questions. Pt educated on medications.  R: Pt's safety ensured with 15 minute and environmental checks. Pt currently denies SI/HI/Self Harm and AVH. Pt verbally contracts to seek staff if SI/HI or A/VH occurs and to consult with staff before acting on any harmful thoughts. Will continue to monitor.

## 2016-08-10 NOTE — Progress Notes (Signed)
[facility] MD Progress Note  08/10/2016 3:09 PM [name]  MRN:  [id number] Subjective:   26 yo Caucasian male, single. Background history of SUD, Substance related psychosis and mood disorder. Patient presented to the ER in company of his brother and his father. He had recently gotten into a fight with his girlfriend. She evicted him from their home. Patient reported homicidal and suicidal thoughts at presentation. His UDS was pos1tive for cocaine and THC.  Nursing staff reports that he has not been observed to be internally stimulated. He has been engaging with unit groups and activities. No behavioral issues.  At interview, patient reports long history of substance use. Says opiates used to be his drug of choice. Says he has stayed away from that for many years. Patient says he did a lot of methamphetamine about two weeks ago. Says his girlfriend saw him interacting with another male addict. Patient states that his girlfriend had a different perception of that interaction. They got into a fight and she ended their relationship. Patient says the day before he presented here, she then kicked him out of the house. patient says he was very upset then. Says he felt like harming her and then harm himself then. Says he has had time to calm down. He is no longer entertaining any suicidal or homicidal thoughts. Says he wants to get better and stay away from substances. His goal is to get into [facility] and do outpatient counseling. Patient recently found out that his exgirlfriend is pregnant. He is not distressed by this. Patient reports history traumatic experience around the drug culture. Says he has nightmares of traumatic events while dealing with drugs. He has consented to trial of Prazosin after we reviewed the risks and benefits. He also wants to switch his antidepressant to Sertraline.   Principal Problem: Substance induced mood disorder (HCC) Diagnosis:   Patient Active Problem List   Diagnosis  Date Noted  . Recurrent major depression-severe (HCC) [F33.2] 08/07/2016  . Substance induced mood disorder (HCC) [F19.94]   . Atypical depression [F32.89]   . Polysubstance (including opioids) dependence with physiol dependence (HCC) [F19.20] 08/23/2014  . Sigmoid diverticulitis s/p lap assisted sigmoid colectomy 07/01/14 [K57.32] 07/01/2014  . Diverticulitis [K57.92] 12/06/2013  . Diverticulitis of large intestine without perforation or abscess without bleeding [K57.32] 12/06/2013  . Obesity, unspecified [E66.9] 12/06/2013  . Acute diverticulitis [K57.92] 12/06/2013  . NAUSEA AND VOMITING [R11.2] 02/09/2009  . OTHER SYMPTOMS INVOLVING DIGESTIVE SYSTEM OTHER [R19.8] 02/09/2009   Total Time spent with patient: 30 minutes  Past Psychiatric History: As in H&P  Past Medical History:  Past Medical History:  Diagnosis Date  . Anxiety   . Attention deficit disorder   . Depression   . Diverticulitis   . Hiccups    pt states hiccups during sleep     Past Surgical History:  Procedure Laterality Date  . colonscopy      removed polyps   . LAPAROSCOPIC PARTIAL COLECTOMY N/A 07/01/2014   Procedure: LAPAROSCOPIC ASSISTED SIGMOID COLECTOMY WITH MOBILIZATION OF SPLENIC FLEXURE;  Surgeon: Avel Peace, MD;  Location: [facility];  Service: General;  Laterality: N/A;   Family History: History reviewed. No pertinent family history. Family Psychiatric  History: As in H&P Social History:  History  Alcohol Use  . Yes    Comment: occasionally     History  Drug Use  . Types: "Crack" cocaine, Heroin, Methamphetamines, Marijuana, Cocaine    Comment: clean since 11/30/15    Social History  Social History  . Marital status: Single    Spouse name: N/A  . Number of children: N/A  . Years of education: N/A   Social History Main Topics  . Smoking status: Never Smoker  . Smokeless tobacco: Current User    Types: Chew  . Alcohol use Yes     Comment: occasionally  . Drug use: Yes    Types:  "Crack" cocaine, Heroin, Methamphetamines, Marijuana, Cocaine     Comment: clean since 11/30/15  . Sexual activity: Yes    Birth control/ protection: Condom   Other Topics Concern  . None   Social History Narrative  . None   Additional Social History:      Name of Substance 1: Methamphetamine 1 - Age of First Use: 15 1 - Amount (size/oz): 1 oz 1 - Frequency: daily 1 - Duration: 2 weeks (Pt reports using from 19 to 11/29/16...relapse three wks ago) 1 - Last Use / Amount: 07/31/16 Name of Substance 2: Cannabis 2 - Age of First Use: 15 2 - Amount (size/oz): 3 blunts daily 2 - Frequency: daily 2 - Duration: Since age of 58 2 - Last Use / Amount: 08/06/16 Name of Substance 3: Heroin 3 - Age of First Use: 15 and started regularly at 10 3 - Amount (size/oz): 1-2 grams 3 - Frequency: daily 3 - Duration: since 26 years old 3 - Last Use / Amount: 11/30/15 Name of Substance 4: Tobacco 4 - Age of First Use: 14 4 - Amount (size/oz): 1 pack daily 4 - Frequency: daily 4 - Duration: since 15 4 - Last Use / Amount: 08/07/16            Sleep: Broken due to nightmares  Appetite:  Good  Current Medications: Current Facility-Administered Medications  Medication Dose Route Frequency Provider Last Rate Last Dose  . acetaminophen (TYLENOL) tablet 650 mg  650 mg Oral Q6H PRN Jackelyn Poling, NP      . alum & mag hydroxide-simeth (MAALOX/MYLANTA) 200-200-20 MG/5ML suspension 20 mL  20 mL Oral Q6H PRN Jackelyn Poling, NP      . hydrOXYzine (ATARAX/VISTARIL) tablet 25 mg  25 mg Oral TID PRN Jackelyn Poling, NP   25 mg at 08/10/16 1144  . magnesium hydroxide (MILK OF MAGNESIA) suspension 30 mL  30 mL Oral Daily PRN Jackelyn Poling, NP      . nicotine (NICODERM CQ - dosed in mg/24 hours) patch 21 mg  21 mg Transdermal Daily Jackelyn Poling, NP   21 mg at 08/10/16 1144  . prazosin (MINIPRESS) capsule 2 mg  2 mg Oral QHS Georgiann Cocker, MD   2 mg at 08/09/16 2227  . QUEtiapine (SEROQUEL) tablet 200 mg   200 mg Oral QHS Georgiann Cocker, MD   200 mg at 08/09/16 2145  . [START ON 08/11/2016] sertraline (ZOLOFT) tablet 50 mg  50 mg Oral Daily Adonis Brook, NP      . traZODone (DESYREL) tablet 50 mg  50 mg Oral QHS PRN Jackelyn Poling, NP   50 mg at 08/09/16 2227    Lab Results:  No results found for this or any previous visit (from the past 48 hour(s)).  Blood Alcohol level:  Lab Results  Component Value Date   ETH <5 08/23/2014    Metabolic Disorder Labs: Lab Results  Component Value Date   HGBA1C 5.0 08/08/2016   MPG 97 08/08/2016   MPG 97 12/01/2015   No results found for:  PROLACTIN Lab Results  Component Value Date   CHOL 178 08/08/2016   TRIG 118 08/08/2016   HDL 52 08/08/2016   CHOLHDL 3.4 08/08/2016   VLDL 24 08/08/2016   LDLCALC 102 (H) 08/08/2016   LDLCALC 60 12/01/2015    Physical Findings: AIMS: Facial and Oral Movements Muscles of Facial Expression: None, normal Lips and Perioral Area: None, normal Jaw: None, normal Tongue: None, normal,Extremity Movements Upper (arms, wrists, hands, fingers): None, normal Lower (legs, knees, ankles, toes): None, normal, Trunk Movements Neck, shoulders, hips: None, normal, Overall Severity Severity of abnormal movements (highest score from questions above): None, normal Incapacitation due to abnormal movements: None, normal Patient's awareness of abnormal movements (rate only patient's report): No Awareness, Dental Status Current problems with teeth and/or dentures?: No Does patient usually wear dentures?: No  CIWA:    COWS:  COWS Total Score: 0  Musculoskeletal: Strength & Muscle Tone: within normal limits Gait & Station: normal Patient leans: N/A  Psychiatric Specialty Exam: Physical Exam  Constitutional: He is oriented to person, place, and time. He appears well-developed and well-nourished.  HENT:  Head: Normocephalic and atraumatic.  Eyes: Conjunctivae and EOM are normal. Pupils are equal, round, and  reactive to light.  Neck: Normal range of motion. Neck supple.  Cardiovascular: Normal rate, regular rhythm and normal heart sounds.   Respiratory: Effort normal and breath sounds normal.  GI: Soft. Bowel sounds are normal.  Musculoskeletal: Normal range of motion.  Neurological: He is alert and oriented to person, place, and time. He has normal reflexes.  Skin: Skin is warm and dry.  Psychiatric:  As above.     ROS  Blood pressure 97/60, pulse (!) 107, temperature 97.7 F (36.5 C), temperature source Oral, resp. rate 18, height 6\' 1"  (1.854 m), weight 109.3 kg (241 lb), SpO2 97 %.Body mass index is 31.8 kg/m.  General Appearance: Overweight, good relatedness. Calm and cooperative. Appropriate behavior. Not internally distracted.   Eye Contact:  Good  Speech:  Clear and Coherent and Normal Rate  Volume:  Normal  Mood:  Euthymic  Affect:  Full range and appropriate   Thought Process:  Organized and goal directed.   Orientation:  Full (Time, Place, and Person)  Thought Content:  No delusional theme. No preoccupation with violent thoughts. No negative ruminations. No obsession.  No hallucination in any modality. He is future oriented.  Suicidal Thoughts:  No  Homicidal Thoughts:  No  Memory:  Immediate;   Good Recent;   Good Remote;   Good  Judgement:  Fair  Insight:  Partial  Psychomotor Activity:  Normal  Concentration:  Concentration: Good and Attention Span: Good  Recall:  Good  Fund of Knowledge:  Good  Language:  Good  Akathisia:  No  Handed:    AIMS (if indicated):     Assets:  Communication Skills Physical Health  ADL's:  Intact  Cognition:  WNL  Sleep:  Number of Hours: 6.25     Treatment Plan Summary: Patient is tolerating his medications well. His mood is stabilizing. He is no longer a danger to himself or others. We are finalizing aftercare.   Psychiatric: SUD (Opiates, cocaine, methamphetamine, THC) Substance Induced Mood  Disorder PTSD  Medical:  Psychosocial:  Recent end of his relationship Girlfriend is expecting a child  PLAN: 1. Continue current regimen 2. Continue to monitor mood, behavior and interaction with peers 3. For discharge as soon as we have appropriate placement.    Georgiann Cocker, MD 08/10/2016,  3:09 PMPatient ID: Benetta SparEthan Williamson, male   DOB: 05/03/1991, 26 y.o.   MRN: [id number]

## 2016-08-10 NOTE — Progress Notes (Signed)
D: Pt presents with anxious mood on approach. Pt denies SI. Pt denies depression. Pt rates anxiety 8/10. Pt requested vistaril for increased anxiety level. Pt reported that he sleeps late at home and prefers to take his meds around noon time. Pt discharge plan is to go to an [facility].  A: Medications reviewed with pt. Zoloft modified by Clinical research associatewriter per verbal request from  [facility]. Medications administered as ordered per MD. Verbal support provided. Pt encouraged to attend groups. 15 minute checks performed for safety.  R: Pt compliant with tx.

## 2016-08-10 NOTE — Progress Notes (Signed)
Pt attended NA meeting this evening.  

## 2016-08-10 NOTE — [facility] Group Notes (Signed)
[facility] LCSW Group Therapy  08/10/2016 12:48 PM  Type of Therapy:  Group Therapy  Participation Level:  Active  Participation Quality:  Attentive  Affect:  Appropriate  Cognitive:  Alert and Oriented  Insight:  Improving  Engagement in Therapy:  Engaged  Modes of Intervention:  Confrontation, Discussion, Education, Problem-solving, Socialization and Support  Summary of Progress/Problems: Today's Topic: Overcoming Obstacles. Patients identified one short term goal and potential obstacles in reaching this goal. Patients processed barriers involved in overcoming these obstacles. Patients identified steps necessary for overcoming these obstacles and explored motivation (internal and external) for facing these difficulties head on.   Teirra Carapia N Smart LCSW 08/10/2016, 12:48 PM

## 2016-08-11 MED ORDER — HYDROXYZINE HCL 25 MG PO TABS
25.0000 mg | ORAL_TABLET | Freq: Four times a day (QID) | ORAL | Status: DC | PRN
  Administered 2016-08-11 (×2): 25 mg via ORAL
  Filled 2016-08-11: qty 10
  Filled 2016-08-11: qty 1
  Filled 2016-08-11: qty 10
  Filled 2016-08-11: qty 1

## 2016-08-11 MED ORDER — QUETIAPINE FUMARATE 300 MG PO TABS
300.0000 mg | ORAL_TABLET | Freq: Every day | ORAL | Status: DC
  Administered 2016-08-11: 300 mg via ORAL
  Filled 2016-08-11 (×3): qty 1
  Filled 2016-08-11: qty 7

## 2016-08-11 NOTE — Progress Notes (Signed)
D: Pt presents with flat affect. Pt reported ongoing anxiety. Pt verbalized that his mood is improving and feels less angry. Pt denies suicidal thoughts and verbally contracts for safety. No withdrawal symptoms verbalized by pt. A: Orders reviewed. Medications administered as ordered per MD. Verbal support provided. Pt encouraged to attend groups. 15  Minute checks performed for safety.  R: Pt compliant with tx.

## 2016-08-11 NOTE — Progress Notes (Signed)
[facility] MD Progress Note  08/11/2016 3:44 PM [name]  MRN:  [id number] Subjective:   26 yo Caucasian male, single. Background history of SUD, Substance related psychosis and mood disorder. Patient presented to the ER in company of his brother and his father. He had recently gotten into a fight with his girlfriend. She evicted him from their home. Patient reported homicidal and suicidal thoughts at presentation. His UDS was pos1tive for cocaine and THC.  Nursing staff reports that he has been engaging well with peers. He has not been observed to be withdrawn or internally stimulated. He has not voiced any suicidal thoughts. He has not voiced any thoughts of violence. He has been tolerating his medications well.  Seen today. States that did not get into sleep early last night. No nightmares when he finally got into sleep. Says he feels good generally. Irritability is less. States that he still take hydroxyzine during the day. No suicidal thoughts. No thoughts of violence. He has been working on getting into a program. Says he would be calling around later today.   Principal Problem: Substance induced mood disorder (HCC) Diagnosis:   Patient Active Problem List   Diagnosis Date Noted  . Recurrent major depression-severe (HCC) [F33.2] 08/07/2016  . Substance induced mood disorder (HCC) [F19.94]   . Atypical depression [F32.89]   . Polysubstance (including opioids) dependence with physiol dependence (HCC) [F19.20] 08/23/2014  . Sigmoid diverticulitis s/p lap assisted sigmoid colectomy 07/01/14 [K57.32] 07/01/2014  . Diverticulitis [K57.92] 12/06/2013  . Diverticulitis of large intestine without perforation or abscess without bleeding [K57.32] 12/06/2013  . Obesity, unspecified [E66.9] 12/06/2013  . Acute diverticulitis [K57.92] 12/06/2013  . NAUSEA AND VOMITING [R11.2] 02/09/2009  . OTHER SYMPTOMS INVOLVING DIGESTIVE SYSTEM OTHER [R19.8] 02/09/2009   Total Time spent with patient: 30  minutes  Past Psychiatric History: As in H&P  Past Medical History:  Past Medical History:  Diagnosis Date  . Anxiety   . Attention deficit disorder   . Depression   . Diverticulitis   . Hiccups    pt states hiccups during sleep     Past Surgical History:  Procedure Laterality Date  . colonscopy      removed polyps   . LAPAROSCOPIC PARTIAL COLECTOMY N/A 07/01/2014   Procedure: LAPAROSCOPIC ASSISTED SIGMOID COLECTOMY WITH MOBILIZATION OF SPLENIC FLEXURE;  Surgeon: Avel Peace, MD;  Location: [facility];  Service: General;  Laterality: N/A;   Family History: History reviewed. No pertinent family history. Family Psychiatric  History: As in H&P Social History:  History  Alcohol Use  . Yes    Comment: occasionally     History  Drug Use  . Types: "Crack" cocaine, Heroin, Methamphetamines, Marijuana, Cocaine    Comment: clean since 11/30/15    Social History   Social History  . Marital status: Single    Spouse name: N/A  . Number of children: N/A  . Years of education: N/A   Social History Main Topics  . Smoking status: Never Smoker  . Smokeless tobacco: Current User    Types: Chew  . Alcohol use Yes     Comment: occasionally  . Drug use: Yes    Types: "Crack" cocaine, Heroin, Methamphetamines, Marijuana, Cocaine     Comment: clean since 11/30/15  . Sexual activity: Yes    Birth control/ protection: Condom   Other Topics Concern  . None   Social History Narrative  . None   Additional Social History:      Name of Substance 1:  Methamphetamine 1 - Age of First Use: 15 1 - Amount (size/oz): 1 oz 1 - Frequency: daily 1 - Duration: 2 weeks (Pt reports using from 19 to 11/29/16...relapse three wks ago) 1 - Last Use / Amount: 07/31/16 Name of Substance 2: Cannabis 2 - Age of First Use: 15 2 - Amount (size/oz): 3 blunts daily 2 - Frequency: daily 2 - Duration: Since age of 59 2 - Last Use / Amount: 08/06/16 Name of Substance 3: Heroin 3 - Age of First Use: 15  and started regularly at 41 3 - Amount (size/oz): 1-2 grams 3 - Frequency: daily 3 - Duration: since 26 years old 3 - Last Use / Amount: 11/30/15 Name of Substance 4: Tobacco 4 - Age of First Use: 14 4 - Amount (size/oz): 1 pack daily 4 - Frequency: daily 4 - Duration: since 15 4 - Last Use / Amount: 08/07/16            Sleep: difficulty getting into sleep.   Appetite:  Good  Current Medications: Current Facility-Administered Medications  Medication Dose Route Frequency Provider Last Rate Last Dose  . acetaminophen (TYLENOL) tablet 650 mg  650 mg Oral Q6H PRN Jackelyn Poling, NP      . alum & mag hydroxide-simeth (MAALOX/MYLANTA) 200-200-20 MG/5ML suspension 20 mL  20 mL Oral Q6H PRN Jackelyn Poling, NP      . hydrOXYzine (ATARAX/VISTARIL) tablet 25 mg  25 mg Oral Q6H PRN Sanjuana Kava, NP   25 mg at 08/11/16 1201  . magnesium hydroxide (MILK OF MAGNESIA) suspension 30 mL  30 mL Oral Daily PRN Jackelyn Poling, NP      . nicotine (NICODERM CQ - dosed in mg/24 hours) patch 21 mg  21 mg Transdermal Daily Jackelyn Poling, NP   21 mg at 08/11/16 1201  . prazosin (MINIPRESS) capsule 2 mg  2 mg Oral QHS Georgiann Cocker, MD   2 mg at 08/10/16 2207  . QUEtiapine (SEROQUEL) tablet 200 mg  200 mg Oral QHS Georgiann Cocker, MD   200 mg at 08/10/16 2111  . sertraline (ZOLOFT) tablet 50 mg  50 mg Oral Daily Adonis Brook, NP   50 mg at 08/11/16 1201  . traZODone (DESYREL) tablet 50 mg  50 mg Oral QHS PRN Jackelyn Poling, NP   50 mg at 08/10/16 2207    Lab Results:  No results found for this or any previous visit (from the past 48 hour(s)).  Blood Alcohol level:  Lab Results  Component Value Date   ETH <5 08/23/2014    Metabolic Disorder Labs: Lab Results  Component Value Date   HGBA1C 5.0 08/08/2016   MPG 97 08/08/2016   MPG 97 12/01/2015   No results found for: PROLACTIN Lab Results  Component Value Date   CHOL 178 08/08/2016   TRIG 118 08/08/2016   HDL 52 08/08/2016   CHOLHDL  3.4 08/08/2016   VLDL 24 08/08/2016   LDLCALC 102 (H) 08/08/2016   LDLCALC 60 12/01/2015    Physical Findings: AIMS: Facial and Oral Movements Muscles of Facial Expression: None, normal Lips and Perioral Area: None, normal Jaw: None, normal Tongue: None, normal,Extremity Movements Upper (arms, wrists, hands, fingers): None, normal Lower (legs, knees, ankles, toes): None, normal, Trunk Movements Neck, shoulders, hips: None, normal, Overall Severity Severity of abnormal movements (highest score from questions above): None, normal Incapacitation due to abnormal movements: None, normal Patient's awareness of abnormal movements (rate only patient's report): No  Awareness, Dental Status Current problems with teeth and/or dentures?: No Does patient usually wear dentures?: No  CIWA:    COWS:  COWS Total Score: 1  Musculoskeletal: Strength & Muscle Tone: within normal limits Gait & Station: normal Patient leans: N/A  Psychiatric Specialty Exam: Physical Exam  Constitutional: He is oriented to person, place, and time. He appears well-developed and well-nourished.  HENT:  Head: Normocephalic and atraumatic.  Eyes: Conjunctivae and EOM are normal. Pupils are equal, round, and reactive to light.  Neck: Normal range of motion. Neck supple.  Cardiovascular: Normal rate, regular rhythm and normal heart sounds.   Respiratory: Effort normal and breath sounds normal.  GI: Soft. Bowel sounds are normal.  Musculoskeletal: Normal range of motion.  Neurological: He is alert and oriented to person, place, and time. He has normal reflexes.  Skin: Skin is warm and dry.  Psychiatric:  As above.     ROS  Blood pressure 105/68, pulse 79, temperature 97.4 F (36.3 C), temperature source Oral, resp. rate 18, height 6\' 1"  (1.854 m), weight 109.3 kg (241 lb), SpO2 97 %.Body mass index is 31.8 kg/m.  General Appearance: Overweight, good relatedness. Calm and cooperative. Appropriate behavior. Not  internally distracted.   Eye Contact:  Good  Speech:  Clear and Coherent and Normal Rate  Volume:  Normal  Mood:  Euthymic  Affect:  Full range and appropriate   Thought Process:  Organized and goal directed.   Orientation:  Full (Time, Place, and Person)  Thought Content:  No delusional theme. No preoccupation with violent thoughts. No negative ruminations. No obsession.  No hallucination in any modality. He is future oriented.  Suicidal Thoughts:  No  Homicidal Thoughts:  No  Memory:  Immediate;   Good Recent;   Good Remote;   Good  Judgement:  Fair  Insight:  Partial  Psychomotor Activity:  Normal  Concentration:  Concentration: Good and Attention Span: Good  Recall:  Good  Fund of Knowledge:  Good  Language:  Good  Akathisia:  No  Handed:    AIMS (if indicated):     Assets:  Communication Skills Physical Health  ADL's:  Intact  Cognition:  WNL  Sleep:  Number of Hours: 6.25     Treatment Plan Summary: Patient is tolerating his medications well. His mood is stabilizing. He is no longer a danger to himself or others. We are finalizing aftercare.   Psychiatric: SUD (Opiates, cocaine, methamphetamine, THC) Substance Induced Mood Disorder PTSD  Medical:  Psychosocial:  Recent end of his relationship Girlfriend is expecting a child  PLAN: 1. Increase Seroquel to 300 mg HS 2. Continue to monitor mood, behavior and interaction with peers 3. For discharge as soon as we have appropriate placement.    Georgiann CockerVincent A Izediuno, MD 08/11/2016, 3:44 PMPatient ID: John SparEthan Williamson, male   DOB: 01/27/1991, 26 y.o.   MRN: [id number] Patient ID: John Sparthan Williamson, male   DOB: 12/16/1990, 26 y.o.   MRN: [id number]

## 2016-08-11 NOTE — Progress Notes (Signed)
  Adult Psychoeducational Group Note  Date:  08/11/2016 Time:  9:00 am  Group Topic/Focus:  Orientation:   The focus of this group is to educate the patient on the purpose and policies of crisis stabilization and provide a format to answer questions about their admission.  The group details unit policies and expectations of patients while admitted.  Participation Level:  Did Not Attend  Participation Quality:    Affect:    Cognitive:    Insight:   Engagement in Group:    Modes of Intervention:    Additional Comments:   Jarelis Ehlert L 08/11/2016, 9:24 AM  

## 2016-08-11 NOTE — Progress Notes (Signed)
Nursing Progress Note: 7p-7a D: Pt currently presents with a pleasant affect and behavior. Pt states "I'm ready to go. I just really want to get into [facility]." Interacting appropriately with milieu. Pt reports good sleep with current medication regimen.   A: Pt provided with medications per providers orders. Pt's labs and vitals were monitored throughout the night. Pt supported emotionally and encouraged to express concerns and questions. Pt educated on medications.  R: Pt's safety ensured with 15 minute and environmental checks. Pt currently denies SI/HI/Self Harm and AVH. Pt verbally contracts to seek staff if SI/HI or A/VH occurs and to consult with staff before acting on any harmful thoughts. Will continue to monitor.

## 2016-08-11 NOTE — [facility] Group Notes (Signed)
[facility] LCSW Group Therapy  08/11/2016 1:51 PM  Type of Therapy:  Group Therapy  Participation Level:  Did Not Attend-pt invited. Chose to remain in bed.   Summary of Progress/Problems:  Finding Balance in Life. Today's group focused on defining balance in one's own words, identifying things that can knock one off balance, and exploring healthy ways to maintain balance in life. Group members were asked to provide an example of a time when they felt off balance, describe how they handled that situation,and process healthier ways to regain balance in the future. Group members were asked to share the most important tool for maintaining balance that [facility] and how they plan to apply this method after discharge.   John Phillippi N Smart LCSW 08/11/2016, 1:51 PM

## 2016-08-12 MED ORDER — QUETIAPINE FUMARATE 300 MG PO TABS: 300.0000 mg | ORAL_TABLET | Freq: Every day | ORAL | 0 refills | Status: DC

## 2016-08-12 MED ORDER — TRAZODONE HCL 50 MG PO TABS: 50.0000 mg | ORAL_TABLET | Freq: Every evening | ORAL | 0 refills | Status: DC | PRN

## 2016-08-12 MED ORDER — PRAZOSIN HCL 2 MG PO CAPS: 2.0000 mg | ORAL_CAPSULE | Freq: Every day | ORAL | 0 refills | Status: DC

## 2016-08-12 MED ORDER — SERTRALINE HCL 50 MG PO TABS: 50.0000 mg | ORAL_TABLET | Freq: Every day | ORAL | 0 refills | Status: DC

## 2016-08-12 MED ORDER — TRAZODONE HCL 100 MG PO TABS: 100.0000 mg | ORAL_TABLET | Freq: Every day | ORAL | 0 refills | Status: DC

## 2016-08-12 MED ORDER — NICOTINE 21 MG/24HR TD PT24: 21.0000 mg | MEDICATED_PATCH | Freq: Every day | TRANSDERMAL | 0 refills | Status: DC

## 2016-08-12 MED ORDER — HYDROXYZINE HCL 25 MG PO TABS: 25.0000 mg | ORAL_TABLET | Freq: Four times a day (QID) | ORAL | 0 refills | Status: DC | PRN

## 2016-08-12 MED ORDER — TRAZODONE HCL 100 MG PO TABS
100.0000 mg | ORAL_TABLET | Freq: Every day | ORAL | Status: DC
  Filled 2016-08-12: qty 7
  Filled 2016-08-12: qty 1

## 2016-08-12 NOTE — Progress Notes (Signed)
  [facility] Adult Case Management Discharge Plan :  Will you be returning to the same living situation after discharge:  No.[facility].  At discharge, do you have transportation home?: Yes,  Winferd Humphrey will pick him up this afternoon. Do you have the ability to pay for your medications: Yes,  mental health  Release of information consent forms completed and submitted to medical records by CSW.  Patient to Follow up at: Follow-up Information    [facility] up.   Specialty:  Behavioral Health Why:  Walk in between 8am-9am Monday through Friday for hospital follow-up/medication management/assessment for counseling services. Please bring photo ID if you have it. Thank you.  Contact information: [address] [place] [address] [phone]           Next level of care provider has access to [facility] Link:no  Safety Planning and Suicide Prevention discussed: Yes,  SPE completed with pt; pt declined to consent to family contact. SPI pamphlet and Mobile Crisis information provided to pt.  Have you used any form of tobacco in the last 30 days? (Cigarettes, Smokeless Tobacco, Cigars, and/or Pipes): Yes  Has patient been referred to the Quitline?: Patient refused referral  Patient has been referred for addiction treatment: Yes  Rangel Echeverri N Smart LCSW 08/12/2016, 11:09 AM

## 2016-08-12 NOTE — Tx Team (Signed)
Interdisciplinary Treatment and Diagnostic Plan Update  08/12/2016 Time of Session: 0930  [name] MRN: [id number]  Principal Diagnosis: PTSD  Secondary Diagnoses: Principal Problem:   Substance induced mood disorder ([place]) Active Problems:   Recurrent major depression-severe ([place])   Current Medications:  Current Facility-Administered Medications  Medication Dose Route Frequency Provider Last Rate Last Dose  . acetaminophen (TYLENOL) tablet 650 mg  650 mg Oral Q6H PRN Rozetta Nunnery, NP   650 mg at 08/11/16 1638  . alum & mag hydroxide-simeth (MAALOX/MYLANTA) 200-200-20 MG/5ML suspension 20 mL  20 mL Oral Q6H PRN Rozetta Nunnery, NP      . hydrOXYzine (ATARAX/VISTARIL) tablet 25 mg  25 mg Oral Q6H PRN Encarnacion Slates, NP   25 mg at 08/11/16 2137  . magnesium hydroxide (MILK OF MAGNESIA) suspension 30 mL  30 mL Oral Daily PRN Rozetta Nunnery, NP      . nicotine (NICODERM CQ - dosed in mg/24 hours) patch 21 mg  21 mg Transdermal Daily Rozetta Nunnery, NP   21 mg at 08/12/16 3875  . prazosin (MINIPRESS) capsule 2 mg  2 mg Oral QHS Artist Beach, MD   2 mg at 08/11/16 2240  . QUEtiapine (SEROQUEL) tablet 300 mg  300 mg Oral QHS Artist Beach, MD   300 mg at 08/11/16 2137  . sertraline (ZOLOFT) tablet 50 mg  50 mg Oral Daily Kerrie Buffalo, NP   50 mg at 08/11/16 1201  . traZODone (DESYREL) tablet 50 mg  50 mg Oral QHS PRN Rozetta Nunnery, NP   50 mg at 08/11/16 2240   PTA Medications: Prescriptions Prior to Admission  Medication Sig Dispense Refill Last Dose  . citalopram (CELEXA) 20 MG tablet Take 1 tablet (20 mg total) by mouth daily. 30 tablet 0 Past Month at Unknown time  . traZODone (DESYREL) 50 MG tablet Take 1 tablet (50 mg total) by mouth at bedtime as needed for sleep. (Patient taking differently: Take 100 mg by mouth at bedtime as needed for sleep. ) 30 tablet 0 Past Month at Unknown time  . [DISCONTINUED] ciprofloxacin (CIPRO) 500 MG tablet Take 1 tablet (500 mg total) by  mouth 2 (two) times daily. 20 tablet 0   . [DISCONTINUED] hydrocortisone 2.5 % lotion Apply topically 2 (two) times daily. 118 mL 0   . [DISCONTINUED] ondansetron (ZOFRAN ODT) 4 MG disintegrating tablet Take 1 tablet (4 mg total) by mouth every 8 (eight) hours as needed for nausea or vomiting. 20 tablet 0     Patient Stressors: Financial difficulties Loss of home Medication change or noncompliance Substance abuse  Patient Strengths: Ability for insight Capable of independent living General fund of knowledge Physical Health Supportive family/friends  Treatment Modalities: Medication Management, Group therapy, Case management,  1 to 1 session with clinician, Psychoeducation, Recreational therapy.   Physician Treatment Plan for Primary Diagnosis: PTSD  Medication Management: Evaluate patient's response, side effects, and tolerance of medication regimen.  Therapeutic Interventions: 1 to 1 sessions, Unit Group sessions and Medication administration.  Evaluation of Outcomes: Met  Physician Treatment Plan for Secondary Diagnosis: Principal Problem:   Substance induced mood disorder ([place]) Active Problems:   Recurrent major depression-severe ([place])  Long Term Goal(s):     Short Term Goals:       Medication Management: Evaluate patient's response, side effects, and tolerance of medication regimen.  Therapeutic Interventions: 1 to 1 sessions, Unit Group sessions and Medication administration.  Evaluation of Outcomes: Met  RN Treatment Plan for Primary Diagnosis: PTSD Long Term Goal(s): Knowledge of disease and therapeutic regimen to maintain health will improve  Short Term Goals: Ability to remain free from injury will improve, Ability to verbalize feelings will improve and Ability to disclose and discuss suicidal ideas  Medication Management: RN will administer medications as ordered by provider, will assess and evaluate patient's response and provide education to patient for  prescribed medication. RN will report any adverse and/or side effects to prescribing provider.  Therapeutic Interventions: 1 on 1 counseling sessions, Psychoeducation, Medication administration, Evaluate responses to treatment, Monitor vital signs and CBGs as ordered, Perform/monitor CIWA, COWS, AIMS and Fall Risk screenings as ordered, Perform wound care treatments as ordered.  Evaluation of Outcomes: Met  LCSW Treatment Plan for Primary Diagnosis: PTSD Long Term Goal(s): Safe transition to appropriate next level of care at discharge, Engage patient in therapeutic group addressing interpersonal concerns.  Short Term Goals: Engage patient in aftercare planning with referrals and resources, Facilitate patient progression through stages of change regarding substance use diagnoses and concerns and Identify triggers associated with mental health/substance abuse issues  Therapeutic Interventions: Assess for all discharge needs, 1 to 1 time with Social worker, Explore available resources and support systems, Assess for adequacy in community support network, Educate family and significant other(s) on suicide prevention, Complete Psychosocial Assessment, Interpersonal group therapy.  Evaluation of Outcomes: Met  Progress in Treatment: Attending groups: Yes Participating in groups: Yes Taking medication as prescribed: Yes. Toleration medication: Yes. Family/Significant other contact made: SPE completed with pt; pt declined to consent to family contact.  Patient understands diagnosis: Yes. Discussing patient identified problems/goals with staff: Yes. Medical problems stabilized or resolved: Yes. Denies suicidal/homicidal ideation: Yes, per self report.  Issues/concerns per patient self-inventory: No. Other: n/a  New problem(s) identified: No, Describe:  n/a  New Short Term/Long Term Goal(s): detox; medication stabilization; development of comprehensive mental wellness/sobriety plan.   Discharge  Plan or Barriers: Pt had trouble reaching [facility]. [facility] into [facility] and plans to follow-up at [facility]. [facility] [place] and AA/NA information for [employer] provided.   Reason for Continuation of Hospitalization: none  Estimated Length of Stay: discharge today  Attendees: Patient: 08/12/2016 11:10 AM  Physician: Dr. Sanjuana Letters MD 08/12/2016 11:10 AM  Nursing: Rayburn Felt RN 08/12/2016 11:10 AM  [place] 08/12/2016 11:10 AM  Social Worker: Maxie Better, LCSW 08/12/2016 11:10 AM  Recreational Therapist: Rhunette Croft 08/12/2016 11:10 AM  Other: Lindell Spar NP 08/12/2016 11:10 AM  Other:  08/12/2016 11:10 AM  Other: 08/12/2016 11:10 AM    Scribe for Treatment Team: Kimber Relic Smart, LCSW 08/12/2016 11:10 AM

## 2016-08-12 NOTE — Discharge Summary (Signed)
Physician Discharge Summary Note  Patient:  [name] is an 26 y.o., male MRN:  [id number] DOB:  26-Mar-1991 Patient phone:  [phone] (home)  Patient address:   [address]  [place] [address],  Total Time spent with patient: Greater than 30 minutes  Date of Admission:  08/07/2016  Date of Discharge: 08/12/2016    Reason for Admission: Suicidal ideations.   Principal Problem: Substance induced mood disorder [facility])  Discharge Diagnoses: Patient Active Problem List   Diagnosis Date Noted  . Polysubstance (including opioids) dependence with physiol dependence (HCC) [F19.20] 08/23/2014    Priority: High  . Recurrent major depression-severe (HCC) [F33.2] 08/07/2016  . Substance induced mood disorder (HCC) [F19.94]   . Atypical depression [F32.89]   . Sigmoid diverticulitis s/p lap assisted sigmoid colectomy 07/01/14 [K57.32] 07/01/2014  . Diverticulitis [K57.92] 12/06/2013  . Diverticulitis of large intestine without perforation or abscess without bleeding [K57.32] 12/06/2013  . Obesity, unspecified [E66.9] 12/06/2013  . Acute diverticulitis [K57.92] 12/06/2013  . NAUSEA AND VOMITING [R11.2] 02/09/2009  . OTHER SYMPTOMS INVOLVING DIGESTIVE SYSTEM OTHER [R19.8] 02/09/2009   Past Psychiatric History: Polysubstance use disorder, dependence including opioid drugs, MDD recurrent.  Past Medical History:  Past Medical History:  Diagnosis Date  . Anxiety   . Attention deficit disorder   . Depression   . Diverticulitis   . Hiccups    pt states hiccups during sleep     Past Surgical History:  Procedure Laterality Date  . colonscopy      removed polyps   . LAPAROSCOPIC PARTIAL COLECTOMY N/A 07/01/2014   Procedure: LAPAROSCOPIC ASSISTED SIGMOID COLECTOMY WITH MOBILIZATION OF SPLENIC FLEXURE;  Surgeon: Avel Peace, MD;  Location: [facility];  Service: General;  Laterality: N/A;   Family History: History reviewed. No pertinent family history.  Family Psychiatric   History: See H&P  Social History:  History  Alcohol Use  . Yes    Comment: occasionally     History  Drug Use  . Types: "Crack" cocaine, Heroin, Methamphetamines, Marijuana, Cocaine    Comment: clean since 11/30/15    Social History   Social History  . Marital status: Single    Spouse name: N/A  . Number of children: N/A  . Years of education: N/A   Social History Main Topics  . Smoking status: Never Smoker  . Smokeless tobacco: Current User    Types: Chew  . Alcohol use Yes     Comment: occasionally  . Drug use: Yes    Types: "Crack" cocaine, Heroin, Methamphetamines, Marijuana, Cocaine     Comment: clean since 11/30/15  . Sexual activity: Yes    Birth control/ protection: Condom   Other Topics Concern  . None   Social History Narrative  . None   Hospital Course:  This is one of several admission assessments for this 26 year old Caucasian male with hx of polysubstance dependence, ADHD & Major depressive disorder. Admitted to the [facility] adult unit as a walk-in with complaints of suicidal ideations after an urtication with girlfriend. Prior reasons for his admission to this hospital has been related to drug addiction & intoxications. During this admission, [name] reports, "I just became suicidal yesterday. A lot is going on at my home. I lost my place of resident. I just found out that I have a baby on the way. I got into an argument with my girlfriend. She kicked me out of the home. That was why the suicidal thoughts kicked in. This is the only time  that I'm in this hospital that is not drug related. I have been depressed prior to this event anyway. I stay depressed, but I don't know the reasons. I was recently diagnosed with having Schizophrenia & bipolar disorder at [facility]. I'm not on any medication for it, not that I believed that I have Bipolar disorder or Schizophrenia. I had gone to [facility] because my mind was not right. I see shadows & hear random sounds. Other times, I will  see cabbage patch kids. The way my mind is these days is not drug induced because I used Meth last about three weeks ago. I have not used heroin since my last hospitalization hear. I was on Celexa, I stopped it because it is not working. I need to get on medication for my mental health".  [name] was admitted to the hospital with his UDS test reports positive for Cocaine &THC. However, his reason for admission was worsening symptoms of depression triggering suicidal ideations. He cited relationship issues & having lost his place of resident as the trigger. He was in need of mood stabilization.   After evaluation of his presenting symptoms, [name] was started on medication regimen for his presenting symptoms. His medication regimen included; Hydroxyzine 25 mg prn for anxiety, Nicotine patch 21 mg for smoking cessation, Minipress 2 mg for nightmares, Seroquel 300 mg for mood control, Sertraline 50 mg for depression & Trazodone 100 mg daily for sleep. He was also enrolled & participated in the group counseling sessions being offered and held on this unit, he learned coping skills that should help him cope better and maintain mood stability after discharge. He presented no other significant pre-existing health issues that required treatment and or monitoring. [name] tolerated his treatment regimen without any significant adverse effects and or reactions reported.  During his hospital stay, [name]'s symptoms were evaluated on daily basis by a clinical provider to ascertain his symptoms are responding to his treatment regimen. This is evidenced by his reports of decreasing symptoms, improved mood, sleep, appetite and presentation of good affect. He is currently being discharged to continue psychiatric treatment and medication management on an outpatient basis as noted below. He is provided with all the pertinent information required to make this appointment without problems.   On this day of hospital discharge, [name]  was in much improved condition than upon admission. He contracted for his safety and felt more in control of his mood. His symptoms were reported as significantly decreased or resolved completely. He denied SI/HI & voiced no AVH. He is instructed & motivated to continue taking medications with a goal of continued improvement in mental health. [place] was provided with some sample & prescriptions for his [facility] discharge medications. He was picked up by [facility]d [facility]. He left [facility] in no apparent distress with all belongings.  Physical Findings: AIMS: Facial and Oral Movements Muscles of Facial Expression: None, normal Lips and Perioral Area: None, normal Jaw: None, normal Tongue: None, normal,Extremity Movements Upper (arms, wrists, hands, fingers): None, normal Lower (legs, knees, ankles, toes): None, normal, Trunk Movements Neck, shoulders, hips: None, normal, Overall Severity Severity of abnormal movements (highest score from questions above): None, normal Incapacitation due to abnormal movements: None, normal Patient's awareness of abnormal movements (rate only patient's report): No Awareness, Dental Status Current problems with teeth and/or dentures?: No Does patient usually wear dentures?: No  CIWA:    COWS:  COWS Total Score: 1  Musculoskeletal: Strength & Muscle Tone: within normal  limits Gait & Station: normal Patient leans: N/A  Psychiatric Specialty Exam: See SRA by MD Physical Exam  Nursing note and vitals reviewed. Constitutional: He is oriented to person, place, and time. He appears well-developed.  HENT:  Head: Normocephalic.  Eyes: Pupils are equal, round, and reactive to light.  Cardiovascular: Normal rate.   Respiratory: Effort normal.  GI: Soft.  Genitourinary:  Genitourinary Comments: Deferred  Musculoskeletal: Normal range of motion.  Neurological: He is alert and oriented to person, place, and time.  Skin: Skin is warm.  Psychiatric:  He has a normal mood and affect. His behavior is normal.    Review of Systems  Constitutional: Negative.   HENT: Negative.   Eyes: Negative.   Respiratory: Negative.   Cardiovascular: Negative.   Gastrointestinal: Negative.   Genitourinary: Negative.   Musculoskeletal: Negative.   Skin: Negative.   Neurological: Negative.   Endo/Heme/Allergies: Negative.   Psychiatric/Behavioral: Positive for depression ([place]) and substance abuse (Hx. Polysubstance dependence). Negative for hallucinations, memory loss and suicidal ideas. The patient has insomnia (Stable). The patient is not nervous/anxious ([place] ).     Blood pressure 96/66, pulse 98, temperature 97.7 F (36.5 C), temperature source Oral, resp. rate 18, height  (1.854 m), weight 109.3 kg (241 lb), SpO2 97 %.Body mass index is 31.8 kg/m.  See Md's SRA  Have you used any form of tobacco in the last 30 days? (Cigarettes, Smokeless Tobacco, Cigars, and/or Pipes): Yes  Has this patient used any form of tobacco in the last 30 days? (Cigarettes, Smokeless Tobacco, Cigars, and/or Pipes): Yes, provided with nicotine patch 21 mg prescription upon discharge.  Blood Alcohol level:  Lab Results  Component Value Date   ETH <5 08/23/2014   Metabolic Disorder Labs:  Lab Results  Component Value Date   HGBA1C 5.0 08/08/2016   MPG 97 08/08/2016   MPG 97 12/01/2015   No results found for: PROLACTIN Lab Results  Component Value Date   CHOL 178 08/08/2016   TRIG 118 08/08/2016   HDL 52 08/08/2016   CHOLHDL 3.4 08/08/2016   VLDL 24 08/08/2016   LDLCALC 102 (H) 08/08/2016   LDLCALC 60 12/01/2015    See Psychiatric Specialty Exam and Suicide Risk Assessment completed by Attending Physician prior to discharge.  Discharge destination:  Home  Is patient on multiple antipsychotic therapies at discharge:  No   Has Patient had three or more failed trials of antipsychotic monotherapy by history:  No  Recommended Plan for Multiple  Antipsychotic Therapies: NA  Allergies as of 08/12/2016      Reactions   Cephalexin Anaphylaxis      Medication List    STOP taking these medications   citalopram 20 MG tablet Commonly known as:  CELEXA     TAKE these medications     Indication  hydrOXYzine 25 MG tablet Commonly known as:  ATARAX/VISTARIL Take 1 tablet (25 mg total) by mouth every 6 (six) hours as needed for anxiety.  Indication:  Anxiety Neurosis   nicotine 21 mg/24hr patch Commonly known as:  NICODERM CQ - dosed in mg/24 hours Place 1 patch (21 mg total) onto the skin daily. For smoking cessation Start taking on:  08/13/2016  Indication:  Nicotine Addiction   prazosin 2 MG capsule Commonly known as:  MINIPRESS Take 1 capsule (2 mg total) by mouth at bedtime. For nightmares  Indication:  Nightmares   QUEtiapine 300 MG tablet Commonly known as:  SEROQUEL Take 1 tablet (300 mg total) by  mouth at bedtime. For mood control  Indication:  Mood control   sertraline 50 MG tablet Commonly known as:  ZOLOFT Take 1 tablet (50 mg total) by mouth daily. For depression  Indication:  Major Depressive Disorder   traZODone 100 MG tablet Commonly known as:  DESYREL Take 1 tablet (100 mg total) by mouth at bedtime. For sleep What changed:  medication strength  how much to take  when to take this  reasons to take this  additional instructions  Indication:  Trouble Sleeping      Follow-up Information    [facility] Follow up.   Specialty:  Behavioral Health Why:  Walk in between 8am-9am Monday through Friday for hospital follow-up/medication management/assessment for counseling services. Please bring photo ID if you have it. Thank you.  [address] [place] [address] [phone]         Follow-up recommendations: Activity:  As tolerated Diet: As recommended by your primary care doctor. Keep all scheduled follow-up appointments as recommended.   Comments: Patient is  instructed prior to discharge to: Take all medications as prescribed by his/her mental healthcare provider. Report any adverse effects and or reactions from the medicines to his/her outpatient provider promptly. Patient has been instructed & cautioned: To not engage in alcohol and or illegal drug use while on prescription medicines. In the event of worsening symptoms, patient is instructed to call the crisis hotline, 911 and or go to the nearest ED for appropriate evaluation and treatment of symptoms. To follow-up with his/her primary care provider for your other medical issues, concerns and or health care needs.    Signed: Sanjuana Kava, NP, PMHNP, FNP-BC 08/12/2016, 1:06 PM

## 2016-08-12 NOTE — Progress Notes (Signed)
Recreation Therapy Notes  Date: 08/12/16 Time: 0930 Location: 300 Hall Dayroom  Group Topic: Stress Management  Goal Area(s) Addresses:  Patient will verbalize importance of using healthy stress management.  Patient will identify positive emotions associated with healthy stress management.   Behavioral Response: Engaged  Intervention: Stress Management  Activity :  Self Forgiveness.  LRT introduced the stress management technique of meditation.  LRT played a meditation focused on self forgiveness allowing patients to participate in the technique.  Patients were to follow along as the meditation played to engage in the technique.  Education:  Stress Management, Discharge Planning.   Education Outcome: Acknowledges edcuation/In group clarification offered/Needs additional education  Clinical Observations/Feedback: Pt attended group.   Heber Hoog, LRT/CTRS         Kirsta Probert A 08/12/2016 11:48 AM 

## 2016-08-12 NOTE — Progress Notes (Cosign Needed)
Adult Psychoeducational Group Note  Date:  08/12/2016 Time:  10:58 AM  Group Topic/Focus:  Relapse Prevention Planning:   The focus of this group is to define relapse and discuss the need for planning to combat relapse.  Participation Level:  Active  Participation Quality:  Appropriate  Affect:  Appropriate  Cognitive:  Alert  Insight: Good  Engagement in Group:  Engaged  Modes of Intervention:  Discussion  Additional Comments:  Pt did participate in group today. Pt states that he came here voluntarily and did not come to [facility] for substance abuse this time.  Pt states that he came to [facility] for mental stability and is going to a place in [place] called [facility] and sounds very hopeful about this place and that he will be attending a lot of meetings there.  Chrysa Rampy R Gavriella Hearst 08/12/2016, 10:58 AM

## 2016-08-12 NOTE — [facility] Group Notes (Signed)
[facility] LCSW Aftercare Discharge Planning Group Note   08/12/2016 11:13 AM  Participation Quality:  Appropriate   Mood/Affect:  Appropriate  Thoughts of Suicide:  No Will you contract for safety?   NA  Current AVH:  No  Plan for Discharge/Comments:  [facility] and will be picked up this afternoon. Pt will follow-up at [facility].   Transportation Means: Winferd Humphrey ([facility]  Supports: some family supports   [employer]

## 2016-08-12 NOTE — Progress Notes (Signed)
Patient ID: [name], male   DOB: 1990-10-04, 26 y.o.   MRN: [id number]  DAR: Pt. Denies SI/HI and A/V Hallucinations. He reports sleep is poor due to minipress, appetite is good, energy level is normal, and concentration is good. He rates depression 2/10, hopelessness 1/10, and anxiety 6/10. Patient does not report any pain or discomfort at this time. Support and encouragement provided to the patient. Scheduled medications administered to patient per physician's orders. Patient is minimal but cooperative. He is seen in the milieu intermittently and is interacting with his peers. He reports he feels ready for the next step after discharge. Q15 minute checks are maintained for safety.

## 2016-08-12 NOTE — [facility] Suicide Risk Assessment (Signed)
[facility] Discharge Suicide Risk Assessment   Principal Problem: Substance induced mood disorder [facility]) Discharge Diagnoses:  Patient Active Problem List   Diagnosis Date Noted  . Recurrent major depression-severe (HCC) [F33.2] 08/07/2016  . Substance induced mood disorder (HCC) [F19.94]   . Atypical depression [F32.89]   . Polysubstance (including opioids) dependence with physiol dependence (HCC) [F19.20] 08/23/2014  . Sigmoid diverticulitis s/p lap assisted sigmoid colectomy 07/01/14 [K57.32] 07/01/2014  . Diverticulitis [K57.92] 12/06/2013  . Diverticulitis of large intestine without perforation or abscess without bleeding [K57.32] 12/06/2013  . Obesity, unspecified [E66.9] 12/06/2013  . Acute diverticulitis [K57.92] 12/06/2013  . NAUSEA AND VOMITING [R11.2] 02/09/2009  . OTHER SYMPTOMS INVOLVING DIGESTIVE SYSTEM OTHER [R19.8] 02/09/2009    Total Time spent with patient: 30 minutes  Musculoskeletal: Strength & Muscle Tone: within normal limits Gait & Station: normal Patient leans: N/A  Psychiatric Specialty Exam: Review of Systems  Constitutional: Negative.   HENT: Negative.   Eyes: Negative.   Respiratory: Negative.   Cardiovascular: Negative.   Gastrointestinal: Negative.   Genitourinary: Negative.   Musculoskeletal: Negative.   Skin: Negative.   Neurological: Negative.   Endo/Heme/Allergies: Negative.   Psychiatric/Behavioral: Negative for depression, hallucinations, memory loss, substance abuse and suicidal ideas. The patient is not nervous/anxious and does not have insomnia.     Blood pressure 96/66, pulse 98, temperature 97.7 F (36.5 C), temperature source Oral, resp. rate 18, height  (1.854 m), weight 109.3 kg (241 lb), SpO2 97 %.Body mass index is 31.8 kg/m.  General Appearance: Neatly dressed, pleasant, engaging well and cooperative. Appropriate behavior. Not in any distress. Good relatedness. Not internally stimulated  Eye Contact::  Good  Speech:   Spontaneous, normal prosody. Normal tone and rate.   Volume:  Normal  Mood:  Euthymic  Affect:  Appropriate and Full Range  Thought Process:  Goal Directed and Linear  Orientation:  Full (Time, Place, and Person)  Thought Content:  No delusional theme. No preoccupation with violent thoughts. No negative ruminations. No obsession.  No hallucination in any modality.   Suicidal Thoughts:  No  Homicidal Thoughts:  No  Memory:  Immediate;   Good Recent;   Good Remote;   Good  Judgement:  Good  Insight:  Good  Psychomotor Activity:  Normal  Concentration:  Good  Recall:  Good  Fund of Knowledge:Good  Language: Good  Akathisia:  No  Handed:    AIMS (if indicated):     Assets:  Communication Skills Desire for Improvement Physical Health Resilience Social Support Vocational/Educational  Sleep:  Number of Hours: 6.25  Cognition: WNL  ADL's:  Intact   Clinical  Assessment::   26 yo Caucasian male, single. Background history of SUD, Substance related psychosis and mood disorder. Patient presented to the ER in company of his brother and his father. He had recently gotten into a fight with his girlfriend. She evicted him from their home. Patient reported homicidal and suicidal thoughts at presentation. His UDS was pos1tive for cocaine and THC.  Seen today. Reports good spirits. Pleased that he has been accepted at an inpatient program. Says he has noticed that he is now able to think things through. He is able to tolerate frustrations without craving to get high. No evidence of depression. No evidence of mania. No evidence of psychosis. No anxiety. PTSD symptoms has resolved. No thoughts of violence. No suicidal or homicidal thoughts.   Nursing staff reports that patient has been appropriate on the unit. Patient has been interacting well  with peers. No behavioral issues. Patient has not voiced any suicidal thoughts. Patient has not been observed to be internally stimulated. Patient has been  adherent with treatment recommendations. Patient has been tolerating their medication well.   Patient was discussed at team. Team members feels that patient is back to his baseline level of function. Team agrees with plan to discharge patient today.   Demographic Factors:  Male  Loss Factors: NA  Historical Factors: Impulsivity  Risk Reduction Factors:   Living with another person, especially a relative, Positive social support, Positive therapeutic relationship and Positive coping skills or problem solving skills  Continued Clinical Symptoms:  As above  Cognitive Features That Contribute To Risk:  None    Suicide Risk:  Minimal: No identifiable suicidal ideation.   Patient is not having any thoughts of suicide at this time. Modifiable risk factors targeted during this admission includes depression and substance use. Demographical and historical risk factors cannot be modified. Patient is now engaging well. Patient is reliable and is future oriented. We have buffered patient's support structures. At this point, patient is at low risk of suicide. Patient is aware of the effects of psychoactive substances on decision making process. Patient has been provided with emergency contacts. Patient acknowledges to use resources provided if unforseen circumstances changes their current risk stratification.    Follow-up Information    [facility] up.   Specialty:  Behavioral Health Why:  Walk in between 8am-9am Monday through Friday for hospital follow-up/medication management/assessment for counseling services. Please bring photo ID if you have it. Thank you.  Contact information: [address] [place] [address] [phone]           Plan Of Care/Follow-up recommendations:  1. Continue current psychotropic medications 2. Mental health and addiction follow up as arranged.    Georgiann Cocker, MD 08/12/2016, 11:40 AM

## 2016-08-12 NOTE — Progress Notes (Signed)
Patient ID: [name], male   DOB: Mar 19, 1991, 26 y.o.   MRN: [id number]  Discharge Note: Belongings returned to patient at time of discharge. Discharge instructions and medications were reviewed with patient. Patient verbalized understanding of both medications and discharge instructions. Patient discharged to lobby. No distress noted upon discharge. Q15 minute safety checks maintained until discharge.

## 2016-09-19 ENCOUNTER — Ambulatory Visit (HOSPITAL_COMMUNITY)
Admission: RE | Admit: 2016-09-19 | Discharge: 2016-09-19 | Disposition: A | Discharge status: Home / Self Care | Payer: Federal, State, Local not specified - Other | Attending: Psychiatry | Admitting: Psychiatry

## 2016-09-20 ENCOUNTER — Emergency Department (HOSPITAL_COMMUNITY)
Admission: EM | Admit: 2016-09-20 | Discharge: 2016-09-20 | Disposition: A | Discharge status: Home / Self Care | Payer: Self-pay

## 2016-09-20 ENCOUNTER — Encounter (HOSPITAL_COMMUNITY): Payer: Self-pay

## 2016-09-20 DIAGNOSIS — Z79899 Other long term (current) drug therapy: Secondary | ICD-10-CM | POA: Insufficient documentation

## 2016-09-20 DIAGNOSIS — F1994 Other psychoactive substance use, unspecified with psychoactive substance-induced mood disorder: Secondary | ICD-10-CM | POA: Diagnosis present

## 2016-09-20 DIAGNOSIS — F1111 Opioid abuse, in remission: Secondary | ICD-10-CM | POA: Diagnosis present

## 2016-09-20 DIAGNOSIS — R45851 Suicidal ideations: Secondary | ICD-10-CM

## 2016-09-20 DIAGNOSIS — F1910 Other psychoactive substance abuse, uncomplicated: Secondary | ICD-10-CM | POA: Diagnosis present

## 2016-09-20 DIAGNOSIS — F909 Attention-deficit hyperactivity disorder, unspecified type: Secondary | ICD-10-CM | POA: Insufficient documentation

## 2016-09-20 DIAGNOSIS — F332 Major depressive disorder, recurrent severe without psychotic features: Secondary | ICD-10-CM

## 2016-09-20 DIAGNOSIS — F1123 Opioid dependence with withdrawal: Secondary | ICD-10-CM | POA: Insufficient documentation

## 2016-09-20 LAB — COMPREHENSIVE METABOLIC PANEL
ALBUMIN: 4 g/dL (ref 3.5–5.0)
ALK PHOS: 60 U/L (ref 38–126)
ALT: 19 U/L (ref 17–63)
ANION GAP: 7 (ref 5–15)
AST: 17 U/L (ref 15–41)
BILIRUBIN TOTAL: 0.3 mg/dL (ref 0.3–1.2)
BUN: 11 mg/dL (ref 6–20)
CO2: 24 mmol/L (ref 22–32)
Calcium: 9.3 mg/dL (ref 8.9–10.3)
Chloride: 108 mmol/L (ref 101–111)
Creatinine, Ser: 0.98 mg/dL (ref 0.61–1.24)
GFR calc Af Amer: >60 mL/min (ref >60)
GFR calc non Af Amer: >60 mL/min (ref >60)
GLUCOSE: 139 mg/dL — AB (ref 65–99)
POTASSIUM: 3.5 mmol/L (ref 3.5–5.1)
Sodium: 139 mmol/L (ref 135–145)
Total Protein: 7 g/dL (ref 6.5–8.1)

## 2016-09-20 LAB — CBC
HEMATOCRIT: 40.1 % (ref 39.0–52.0)
HEMOGLOBIN: 13.6 g/dL (ref 13.0–17.0)
MCH: 29.1 pg (ref 26.0–34.0)
MCHC: 33.9 g/dL (ref 30.0–36.0)
MCV: 85.9 fL (ref 78.0–100.0)
Platelets: 281 10*3/uL (ref 150–400)
RBC: 4.67 MIL/uL (ref 4.22–5.81)
RDW: 13.2 % (ref 11.5–15.5)
WBC: 7.7 10*3/uL (ref 4.0–10.5)

## 2016-09-20 LAB — ETHANOL: Alcohol, Ethyl (B): <5 mg/dL (ref <5)

## 2016-09-20 LAB — ACETAMINOPHEN LEVEL

## 2016-09-20 LAB — SALICYLATE LEVEL: Salicylate Lvl: <7 mg/dL (ref 2.8–30.0)

## 2016-09-20 MED ORDER — SERTRALINE HCL 50 MG PO TABS: 50.0000 mg | ORAL_TABLET | Freq: Every day | ORAL | 0 refills | Status: DC

## 2016-09-20 MED ORDER — TRAZODONE HCL 100 MG PO TABS: 100.0000 mg | ORAL_TABLET | Freq: Every day | ORAL | 0 refills | Status: DC

## 2016-09-20 MED ORDER — ONDANSETRON HCL 4 MG PO TABS: 4.0000 mg | ORAL_TABLET | Freq: Three times a day (TID) | ORAL | Status: DC | PRN

## 2016-09-20 MED ORDER — NICOTINE 21 MG/24HR TD PT24
21.0000 mg | MEDICATED_PATCH | Freq: Every day | TRANSDERMAL | Status: DC
  Administered 2016-09-20: 21 mg via TRANSDERMAL
  Filled 2016-09-20: qty 1

## 2016-09-20 MED ORDER — LORAZEPAM 1 MG PO TABS: 1.0000 mg | ORAL_TABLET | Freq: Three times a day (TID) | ORAL | Status: DC | PRN

## 2016-09-20 MED ORDER — ACETAMINOPHEN 325 MG PO TABS: 650.0000 mg | ORAL_TABLET | ORAL | Status: DC | PRN

## 2016-09-20 MED ORDER — QUETIAPINE FUMARATE 300 MG PO TABS: 300.0000 mg | ORAL_TABLET | Freq: Every day | ORAL | 0 refills | Status: DC

## 2016-09-20 MED ORDER — ZOLPIDEM TARTRATE 5 MG PO TABS: 5.0000 mg | ORAL_TABLET | Freq: Every evening | ORAL | Status: DC | PRN

## 2016-09-20 MED ORDER — QUETIAPINE FUMARATE 300 MG PO TABS: 300.0000 mg | ORAL_TABLET | Freq: Every day | ORAL | Status: DC

## 2016-09-20 MED ORDER — IBUPROFEN 200 MG PO TABS: 600.0000 mg | ORAL_TABLET | Freq: Three times a day (TID) | ORAL | Status: DC | PRN

## 2016-09-20 MED ORDER — SERTRALINE HCL 50 MG PO TABS
50.0000 mg | ORAL_TABLET | Freq: Every day | ORAL | Status: DC
  Administered 2016-09-20: 50 mg via ORAL
  Filled 2016-09-20: qty 1

## 2016-09-20 MED ORDER — ALUM & MAG HYDROXIDE-SIMETH 200-200-20 MG/5ML PO SUSP
30.0000 mL | ORAL | Status: DC | PRN
Start: 2016-09-20 — End: 2016-09-20

## 2016-09-20 MED ORDER — TRAZODONE HCL 100 MG PO TABS: 100.0000 mg | ORAL_TABLET | Freq: Every day | ORAL | Status: DC

## 2016-09-20 NOTE — Consult Note (Signed)
[facility] Consult   Reason for Consult:  Transient suicidal ideation Referring Physician:  EDP Patient Identification: [name] MRN:  [id number] Principal Diagnosis: Substance induced mood disorder ([place]) Diagnosis:   Patient Active Problem List   Diagnosis Date Noted  . Polysubstance abuse [F19.10]     Priority: High  . Substance induced mood disorder ([place]) [F19.94]     Priority: High  . Polysubstance (including opioids) dependence with physiol dependence ([place]) [F19.20] 08/23/2014    Priority: High  . Recurrent major depression-severe ([place]) [F33.2] 08/07/2016  . Atypical depression [F32.89]   . Sigmoid diverticulitis s/p lap assisted sigmoid colectomy 07/01/14 [K57.32] 07/01/2014  . Diverticulitis [K57.92] 12/06/2013  . Diverticulitis of large intestine without perforation or abscess without bleeding [K57.32] 12/06/2013  . Obesity, unspecified [E66.9] 12/06/2013  . Acute diverticulitis [K57.92] 12/06/2013  . NAUSEA AND VOMITING [R11.2] 02/09/2009  . OTHER SYMPTOMS INVOLVING DIGESTIVE SYSTEM OTHER [R19.8] 02/09/2009    Total Time spent with patient: 30 minutes  Subjective:   [name] is a 26 y.o. male patient admitted with reports of transient suicidal ideation and concomitant relapse on substances (UDS + THC, coc). Pt seen and chart reviewed. Pt is alert/oriented x4, calm, cooperative, and appropriate to situation. Pt denies suicidal/homicidal ideation and psychosis and does not appear to be responding to internal stimuli. Pt originally stated that he felt suicidal about his ex girlfriend "terminating the pregnancy of our baby" but later was able to contract for safety, asking to be sent home so that he can continue to coordinate a bed with [facility]. Pt is motivated to obtain help.   HPI:  I have reviewed and concur with HPI elements below, modified as follows: "[name] is an 26 y.o. male, Caucasian who presents to [facility] as a walk-in with c/o  recent relapse and life situation/trauma with depression as well as recent suicide attempt. Patient states that he resides at [facility] and relapsed, pt allowed to return but encouraged to seek treatment with SI and S.A. Patient states that in past x 3 weeks he has had depression and some life situations then he relapsed making it worse. Patient states that he does sleep less with as little as x 2 hours per night.   Patient acknowledges current SI with plan o.d. Or blow brains out, Pt. Had gun earlier to head and stopped decided come get help and is contemplating why he exists/ reason for living. Patient denies current HI and AVH. Patient has hx. Of S.A. With poly substances most recent was cannabis, last use 09/19/16 3 blunts, Opioids, Fetanil patches and Heroine unspecified amounts last use 09/18/16 and Cocaine started x 2 -3 weeks ago with last use on 09/18/16 for 1 gram or more. Patient was last seen inpatient at [facility] on 7/17 for SI and S.A. Patient is seen outpatient via [facility]. Patient is  in disheveled appearance and is alert and oriented x4. Patient speech was within normal limits and motor behavior appeared normal. Patient thought process is coherent. Patient does not appear to be responding to internal stimuli. Patient was cooperative throughout the assessment and states that he is agreeable to inpatient psychiatric treatment."  Interval History 09/20/16: Pt seen as above for psychiatric evaluation. No reported incidents in ED and pt has been cooperative with staff members.   Past Psychiatric History: substance abuse, MDD  Risk to Self: Is patient at risk for suicide?: No  Risk to Others:   Prior Inpatient Therapy:  Prior Outpatient Therapy:    Past Medical History:  Past Medical History:  Diagnosis Date  . Anxiety   . Attention deficit disorder   . Depression   . Diverticulitis   . Hiccups    pt states hiccups during sleep     Past Surgical History:  Procedure  Laterality Date  . colonscopy      removed polyps   . LAPAROSCOPIC PARTIAL COLECTOMY N/A 07/01/2014   Procedure: LAPAROSCOPIC ASSISTED SIGMOID COLECTOMY WITH MOBILIZATION OF SPLENIC FLEXURE;  Surgeon: Jackolyn Confer, MD;  Location: [facility];  Service: General;  Laterality: N/A;   Family History: History reviewed. No pertinent family history. Family Psychiatric  History: denies Social History:  History  Alcohol Use  . Yes    Comment: occasionally     History  Drug Use  . Types: "Crack" cocaine, Heroin, Methamphetamines, Marijuana, Cocaine    Comment: clean since 11/30/15    Social History   Social History  . Marital status: Single    Spouse name: N/A  . Number of children: N/A  . Years of education: N/A   Social History Main Topics  . Smoking status: Never Smoker  . Smokeless tobacco: Current User    Types: Chew  . Alcohol use Yes     Comment: occasionally  . Drug use: Yes    Types: "Crack" cocaine, Heroin, Methamphetamines, Marijuana, Cocaine     Comment: clean since 11/30/15  . Sexual activity: Yes    Birth control/ protection: Condom   Other Topics Concern  . None   Social History Narrative  . None   Additional Social History:    Allergies:   Allergies  Allergen Reactions  . Cephalexin Anaphylaxis    Labs:  Results for orders placed or performed during the hospital encounter of 09/20/16 (from the past 48 hour(s))  Comprehensive metabolic panel     Status: Abnormal   Collection Time: 09/20/16  3:10 AM  Result Value Ref Range   Sodium 139 135 - 145 mmol/L   Potassium 3.5 3.5 - 5.1 mmol/L   Chloride 108 101 - 111 mmol/L   CO2 24 22 - 32 mmol/L   Glucose, Bld 139 (H) 65 - 99 mg/dL   BUN 11 6 - 20 mg/dL   Creatinine, Ser 0.98 0.61 - 1.24 mg/dL   Calcium 9.3 8.9 - 10.3 mg/dL   Total Protein 7.0 6.5 - 8.1 g/dL   Albumin 4.0 3.5 - 5.0 g/dL   AST 17 15 - 41 U/L   ALT 19 17 - 63 U/L   Alkaline Phosphatase 60 38 - 126 U/L   Total Bilirubin 0.3 0.3 - 1.2  mg/dL   GFR calc non Af Amer >60 >60 mL/min   GFR calc Af Amer >60 >60 mL/min    Comment: (NOTE) The eGFR has been calculated using the CKD EPI equation. This calculation has not been validated in all clinical situations. eGFR's persistently <60 mL/min signify possible Chronic Kidney Disease.    Anion gap 7 5 - 15  Ethanol     Status: None   Collection Time: 09/20/16  3:10 AM  Result Value Ref Range   Alcohol, Ethyl (B) <5 <5 mg/dL    Comment:        LOWEST DETECTABLE LIMIT FOR SERUM ALCOHOL IS 5 mg/dL FOR MEDICAL PURPOSES ONLY   Salicylate level     Status: None   Collection Time: 09/20/16  3:10 AM  Result Value Ref Range   Salicylate Lvl <5.9 2.8 -  30.0 mg/dL  Acetaminophen level     Status: Abnormal   Collection Time: 09/20/16  3:10 AM  Result Value Ref Range   Acetaminophen (Tylenol), Serum <10 (L) 10 - 30 ug/mL    Comment:        THERAPEUTIC CONCENTRATIONS VARY SIGNIFICANTLY. A RANGE OF 10-30 ug/mL MAY BE AN EFFECTIVE CONCENTRATION FOR MANY PATIENTS. HOWEVER, SOME ARE BEST TREATED AT CONCENTRATIONS OUTSIDE THIS RANGE. ACETAMINOPHEN CONCENTRATIONS >150 ug/mL AT 4 HOURS AFTER INGESTION AND >50 ug/mL AT 12 HOURS AFTER INGESTION ARE OFTEN ASSOCIATED WITH TOXIC REACTIONS.   cbc     Status: None   Collection Time: 09/20/16  3:10 AM  Result Value Ref Range   WBC 7.7 4.0 - 10.5 K/uL   RBC 4.67 4.22 - 5.81 MIL/uL   Hemoglobin 13.6 13.0 - 17.0 g/dL   HCT 40.1 39.0 - 52.0 %   MCV 85.9 78.0 - 100.0 fL   MCH 29.1 26.0 - 34.0 pg   MCHC 33.9 30.0 - 36.0 g/dL   RDW 13.2 11.5 - 15.5 %   Platelets 281 150 - 400 K/uL    Current Facility-Administered Medications  Medication Dose Route Frequency Provider Last Rate Last Dose  . acetaminophen (TYLENOL) tablet 650 mg  650 mg Oral Q4H PRN Domenic Moras, PA-C      . alum & mag hydroxide-simeth (MAALOX/MYLANTA) 200-200-20 MG/5ML suspension 30 mL  30 mL Oral PRN Domenic Moras, PA-C      . ibuprofen (ADVIL,MOTRIN) tablet 600 mg  600  mg Oral Q8H PRN Domenic Moras, PA-C      . LORazepam (ATIVAN) tablet 1 mg  1 mg Oral Q8H PRN Domenic Moras, PA-C      . nicotine (NICODERM CQ - dosed in mg/24 hours) patch 21 mg  21 mg Transdermal Daily Domenic Moras, PA-C   21 mg at 09/20/16 1025  . ondansetron (ZOFRAN) tablet 4 mg  4 mg Oral Q8H PRN Domenic Moras, PA-C      . QUEtiapine (SEROQUEL) tablet 300 mg  300 mg Oral QHS Domenic Moras, PA-C      . sertraline (ZOLOFT) tablet 50 mg  50 mg Oral Daily Domenic Moras, PA-C   50 mg at 09/20/16 1025  . traZODone (DESYREL) tablet 100 mg  100 mg Oral QHS Domenic Moras, PA-C      . zolpidem (AMBIEN) tablet 5 mg  5 mg Oral QHS PRN Domenic Moras, PA-C       Current Outpatient Prescriptions  Medication Sig Dispense Refill  . QUEtiapine (SEROQUEL) 300 MG tablet Take 1 tablet (300 mg total) by mouth at bedtime. For mood control 30 tablet 0  . sertraline (ZOLOFT) 50 MG tablet Take 1 tablet (50 mg total) by mouth daily. For depression 30 tablet 0  . traZODone (DESYREL) 100 MG tablet Take 1 tablet (100 mg total) by mouth at bedtime. For sleep 30 tablet 0  . hydrOXYzine (ATARAX/VISTARIL) 25 MG tablet Take 1 tablet (25 mg total) by mouth every 6 (six) hours as needed for anxiety. (Patient not taking: Reported on 09/20/2016) 60 tablet 0  . nicotine (NICODERM CQ - DOSED IN MG/24 HOURS) 21 mg/24hr patch Place 1 patch (21 mg total) onto the skin daily. For smoking cessation (Patient not taking: Reported on 09/20/2016) 28 patch 0  . prazosin (MINIPRESS) 2 MG capsule Take 1 capsule (2 mg total) by mouth at bedtime. For nightmares (Patient not taking: Reported on 09/20/2016) 30 capsule 0  . QUEtiapine (SEROQUEL) 300 MG tablet Take 1 tablet (  300 mg total) by mouth at bedtime. 30 tablet 0  . [START ON 09/21/2016] sertraline (ZOLOFT) 50 MG tablet Take 1 tablet (50 mg total) by mouth daily. 30 tablet 0  . traZODone (DESYREL) 100 MG tablet Take 1 tablet (100 mg total) by mouth at bedtime. 30 tablet 0    Musculoskeletal: Strength & Muscle  Tone: within normal limits Gait & Station: normal Patient leans: N/A  Psychiatric Specialty Exam: Physical Exam  Review of Systems  Psychiatric/Behavioral: Positive for depression and substance abuse. Negative for hallucinations and suicidal ideas. The patient is nervous/anxious and has insomnia.   All other systems reviewed and are negative.   Blood pressure 140/89, pulse 63, temperature 98.2 F (36.8 C), temperature source Oral, resp. rate 16, SpO2 100 %.There is no height or weight on file to calculate BMI.  General Appearance: Casual and Fairly Groomed  Eye Contact:  Good  Speech:  Clear and Coherent and Normal Rate  Volume:  Normal  Mood:  Anxious and Depressed  Affect:  Appropriate, Congruent and Depressed  Thought Process:  Coherent, Goal Directed, Linear and Descriptions of Associations: Loose  Orientation:  Full (Time, Place, and Person)  Thought Content:    Suicidal Thoughts:  No  Homicidal Thoughts:  No  Memory:  Immediate;   Fair Recent;   Fair Remote;   Fair  Judgement:  Fair  Insight:  Fair  Psychomotor Activity:  Normal  Concentration:  Concentration: Fair and Attention Span: Fair  Recall:  [employer] of Knowledge:  Fair  Language:  Fair  Akathisia:  No  Handed:    AIMS (if indicated):     Assets:  Communication Skills Desire for Improvement Resilience Social Support  ADL's:  Intact  Cognition:  WNL  Sleep:      Treatment Plan Summary: Substance induced mood disorder ([place]) with MDD, recurrent, severe, without psychosis, treated as below   Medications: -Continue Seroquel 358m po qhs for mood stabilization -Continue Zoloft 520mpo daily for depression -Continue Trazodone 10072mo qhs prn insomnia  Disposition: No evidence of imminent risk to self or others at present.   Patient does not meet criteria for psychiatric inpatient admission. Supportive therapy provided about ongoing stressors. Refer to IOP. Discussed crisis plan, support from social  network, calling 911, coming to the Emergency Department, and calling Suicide Hotline.  WitBenjamine MolaNPNorth [place]8/2018 12:42 PM  Patient seen face-to-face for psychiatric evaluation, chart reviewed and case discussed with the physician extender and developed treatment plan. Reviewed the information documented and agree with the treatment plan. MojCorena PilgrimD

## 2016-09-20 NOTE — ED Notes (Signed)
Pt discharged home. Discharged instructions read to pt who verbalized understanding. All belongings returned to pt who signed for same. Denies SI/HI, is not delusional and not responding to internal stimuli. Escorted pt to the ED exit.  Pt given bus pass. 

## 2016-09-20 NOTE — ED Provider Notes (Signed)
[facility]-EMERGENCY DEPT Provider Note   CSN: [id number] Arrival date & time: 09/20/16  0040     History   Chief Complaint Chief Complaint  Patient presents with  . Suicidal    HPI [name] is a 26 y.o. male.  HPI   26 year old male with history of polysubstance abuse including opioid, severe depression, obesity presenting with suicidal ideation. Patient states he abuses inhale when and was clean since 2017 however for the past 3 weeks he relapsed after his girlfriend terminated her pregnancy. Patient states he has been using heroin, and cocaine, last use 3 days ago. He reported feeling depressed, having suicidal thoughts including trying to overdose by injecting himself with a large dose of fentanyl but decided against that. He is here requesting for help. He denies any homicidal ideation. He reports seeing shadows but denies any auditory hallucination. He is eating less and sleeping less. Aside from generalized body aches, he has no other complaint. Denies alcohol use. He is a smoker. He does have history of hepatitis C likely due to IV drug use. States that he hasn't been any of his psychiatric medication for the past 3 weeks and attributed to his depression.  Past Medical History:  Diagnosis Date  . Anxiety   . Attention deficit disorder   . Depression   . Diverticulitis   . Hiccups    pt states hiccups during sleep     Patient Active Problem List   Diagnosis Date Noted  . Recurrent major depression-severe (HCC) 08/07/2016  . Substance induced mood disorder (HCC)   . Atypical depression   . Polysubstance (including opioids) dependence with physiol dependence (HCC) 08/23/2014  . Sigmoid diverticulitis s/p lap assisted sigmoid colectomy 07/01/14 07/01/2014  . Diverticulitis 12/06/2013  . Diverticulitis of large intestine without perforation or abscess without bleeding 12/06/2013  . Obesity, unspecified 12/06/2013  . Acute diverticulitis 12/06/2013  . NAUSEA AND VOMITING  02/09/2009  . OTHER SYMPTOMS INVOLVING DIGESTIVE SYSTEM OTHER 02/09/2009    Past Surgical History:  Procedure Laterality Date  . colonscopy      removed polyps   . LAPAROSCOPIC PARTIAL COLECTOMY N/A 07/01/2014   Procedure: LAPAROSCOPIC ASSISTED SIGMOID COLECTOMY WITH MOBILIZATION OF SPLENIC FLEXURE;  Surgeon: Avel Peace, MD;  Location: [facility];  Service: General;  Laterality: N/A;       Home Medications    Prior to Admission medications   Medication Sig Start Date End Date Taking? Authorizing Provider  QUEtiapine (SEROQUEL) 300 MG tablet Take 1 tablet (300 mg total) by mouth at bedtime. For mood control 08/12/16  Yes Nwoko, Nicole Kindred I, NP  sertraline (ZOLOFT) 50 MG tablet Take 1 tablet (50 mg total) by mouth daily. For depression 08/12/16  Yes Armandina Stammer I, NP  traZODone (DESYREL) 100 MG tablet Take 1 tablet (100 mg total) by mouth at bedtime. For sleep 08/12/16  Yes Armandina Stammer I, NP  hydrOXYzine (ATARAX/VISTARIL) 25 MG tablet Take 1 tablet (25 mg total) by mouth every 6 (six) hours as needed for anxiety. Patient not taking: Reported on 09/20/2016 08/12/16   Armandina Stammer I, NP  nicotine (NICODERM CQ - DOSED IN MG/24 HOURS) 21 mg/24hr patch Place 1 patch (21 mg total) onto the skin daily. For smoking cessation Patient not taking: Reported on 09/20/2016 08/13/16   Armandina Stammer I, NP  prazosin (MINIPRESS) 2 MG capsule Take 1 capsule (2 mg total) by mouth at bedtime. For nightmares Patient not taking: Reported on 09/20/2016 08/12/16   Sanjuana Kava, NP  Family History History reviewed. No pertinent family history.  Social History Social History  Substance Use Topics  . Smoking status: Never Smoker  . Smokeless tobacco: Current User    Types: Chew  . Alcohol use Yes     Comment: occasionally     Allergies   Cephalexin   Review of Systems Review of Systems  All other systems reviewed and are negative.    Physical Exam Updated Vital Signs BP 126/84 (BP Location: Right Arm)    Pulse 60   Temp 98.2 F (36.8 C) (Oral)   Resp 20   SpO2 99%   Physical Exam  Constitutional: He appears well-developed and well-nourished. No distress.  Patient resting comfortably in no acute discomfort.  HENT:  Head: Atraumatic.  Eyes: Conjunctivae are normal.  Neck: Neck supple.  Cardiovascular: Normal rate and regular rhythm.   Pulmonary/Chest: Effort normal and breath sounds normal.  Abdominal: Soft. Bowel sounds are normal. He exhibits no distension. There is no tenderness.  Neurological: He is alert. GCS eye subscore is 4. GCS verbal subscore is 5. GCS motor subscore is 6.  Skin: No rash noted.  Psychiatric: He has a normal mood and affect. His speech is normal and behavior is normal. Thought content is not paranoid. He expresses suicidal ideation. He expresses no homicidal ideation.  Patient is calm and cooperative. Speech is goal oriented.  Nursing note and vitals reviewed.    ED Treatments / Results  Labs (all labs ordered are listed, but only abnormal results are displayed) Labs Reviewed  COMPREHENSIVE METABOLIC PANEL - Abnormal; Notable for the following:       Result Value   Glucose, Bld 139 (*)    All other components within normal limits  ACETAMINOPHEN LEVEL - Abnormal; Notable for the following:    Acetaminophen (Tylenol), Serum <10 (*)    All other components within normal limits  ETHANOL  SALICYLATE LEVEL  CBC  RAPID URINE DRUG SCREEN, HOSP PERFORMED    EKG  EKG Interpretation None       Radiology No results found.  Procedures Procedures (including critical care time)  Medications Ordered in ED Medications  LORazepam (ATIVAN) tablet 1 mg (not administered)  acetaminophen (TYLENOL) tablet 650 mg (not administered)  ibuprofen (ADVIL,MOTRIN) tablet 600 mg (not administered)  zolpidem (AMBIEN) tablet 5 mg (not administered)  nicotine (NICODERM CQ - dosed in mg/24 hours) patch 21 mg (not administered)  ondansetron (ZOFRAN) tablet 4 mg (not  administered)  alum & mag hydroxide-simeth (MAALOX/MYLANTA) 200-200-20 MG/5ML suspension 30 mL (not administered)  QUEtiapine (SEROQUEL) tablet 300 mg (not administered)  sertraline (ZOLOFT) tablet 50 mg (not administered)  traZODone (DESYREL) tablet 100 mg (not administered)     Initial Impression / Assessment and Plan / ED Course  I have reviewed the triage vital signs and the nursing notes.  Pertinent labs & imaging results that were available during my care of the patient were reviewed by me and considered in my medical decision making (see chart for details).     BP 126/84 (BP Location: Right Arm)   Pulse 60   Temp 98.2 F (36.8 C) (Oral)   Resp 20   SpO2 99%    Final Clinical Impressions(s) / ED Diagnoses   Final diagnoses:  Suicidal thoughts  Polysubstance abuse    New Prescriptions New Prescriptions   No medications on file   4:35 AM Patient here requesting help with opiate detox. He also reports feeling suicidal with plan. Patient is medically cleared, will  consult TTS for further psychiatric management.   Fayrene Helper, PA-C 09/20/16 1610    Devoria Albe, MD 09/20/16 [phone]

## 2016-09-20 NOTE — Discharge Instructions (Signed)
To help you maintain a sober lifestyle, a substance abuse treatment program may be beneficial to you.  You have indicated that you have made arrangements to follow up with [facility].  You are advised to follow through with this plan at your earliest opportunity:       [facility]      [address]      [place], [place] [address]      [phone]

## 2016-09-20 NOTE — H&P (Signed)
Behavioral Health Medical Screening Exam  [name] is an 26 y.o. male, accompanied with his father, seeking psychiatric evaluation due to worsening depression x three weeks duration, after being notified that his GF had an elective abortion of his child. He states he has been using heroin and attempted to O/d with fentanyl in addition to getting a pistol to shoot himself. He is denying any acute ailments or known chronic co-morbid conditions.  Total Time spent with patient: 20 minutes  Psychiatric Specialty Exam: Physical Exam  Constitutional: He is oriented to person, place, and time. He appears well-developed and well-nourished. No distress.  HENT:  Head: Normocephalic.  Eyes: Pupils are equal, round, and reactive to light.  Respiratory: Effort normal and breath sounds normal. No respiratory distress.  Neurological: He is alert and oriented to person, place, and time. No cranial nerve deficit.  Skin: Skin is warm and dry. He is not diaphoretic.  Psychiatric: His speech is normal. His mood appears anxious. His affect is angry. He is agitated. Cognition and memory are impaired. He expresses impulsivity. He exhibits a depressed mood. He expresses suicidal ideation. He expresses suicidal plans.    Review of Systems  Psychiatric/Behavioral: Positive for depression, substance abuse and suicidal ideas. The patient has insomnia.   All other systems reviewed and are negative.   There were no vitals taken for this visit.There is no height or weight on file to calculate BMI.  General Appearance: Disheveled  Eye Contact:  Fair  Speech:  Clear and Coherent  Volume:  Normal  Mood:  Depressed  Affect:  Congruent  Thought Process:  Goal Directed  Orientation:  Full (Time, Place, and Person)  Thought Content:  Negative  Suicidal Thoughts:  Yes.  with intent/plan  Homicidal Thoughts:  No  Memory:  Immediate;   Fair  Judgement:  Poor  Insight:  Lacking  Psychomotor Activity:  Negative   Concentration: Concentration: Fair  Recall:  [employer]  Fund of Knowledge:Fair  Language: Good  Akathisia:  Negative  Handed:  Right  AIMS (if indicated):     Assets:  Desire for Improvement  Sleep:       Musculoskeletal: Strength & Muscle Tone: within normal limits Gait & Station: normal Patient leans: N/A  There were no vitals taken for this visit.  Recommendations:  Based on my evaluation the patient appears to have an emergency medical condition for which I recommend the patient be transferred to the emergency department for further evaluation.  John HoughSpencer E Tallyn Holroyd, PA-C 09/20/2016, 3:55 AM

## 2016-09-20 NOTE — [facility] Suicide Risk Assessment (Signed)
  [facility]   Principal Problem: Substance induced mood disorder [facility](HCC) Discharge Diagnoses:  Patient Active Problem List   Diagnosis Date Noted  . Polysubstance abuse [F19.10]     Priority: High  . Substance induced mood disorder (HCC) [F19.94]     Priority: High  . Polysubstance (including opioids) dependence with physiol dependence (HCC) [F19.20] 08/23/2014    Priority: High  . Recurrent major depression-severe (HCC) [F33.2] 08/07/2016  . Atypical depression [F32.89]   . Sigmoid diverticulitis s/p lap assisted sigmoid colectomy 07/01/14 [K57.32] 07/01/2014  . Diverticulitis [K57.92] 12/06/2013  . Diverticulitis of large intestine without perforation or abscess without bleeding [K57.32] 12/06/2013  . Obesity, unspecified [E66.9] 12/06/2013  . Acute diverticulitis [K57.92] 12/06/2013  . NAUSEA AND VOMITING [R11.2] 02/09/2009  . OTHER SYMPTOMS INVOLVING DIGESTIVE SYSTEM OTHER [R19.8] 02/09/2009    Total Time spent with patient: 15 minutes  Musculoskeletal: Strength & Muscle Tone: within normal limits Gait & Station: normal Patient leans: N/A  Psychiatric Specialty Exam:   Blood pressure (!) 111/54, pulse 97, temperature 98.2 F (36.8 C), temperature source Oral, resp. rate 18, SpO2 100 %.There is no height or weight on file to calculate BMI.   General Appearance: Casual and Fairly Groomed  Eye Contact:  Good  Speech:  Clear and Coherent and Normal Rate  Volume:  Normal  Mood:  Anxious and Depressed  Affect:  Appropriate, Congruent and Depressed  Thought Process:  Coherent, Goal Directed, Linear and Descriptions of Associations: Loose  Orientation:  Full (Time, Place, and Person)  Thought Content:    Suicidal Thoughts:  No  Homicidal Thoughts:  No  Memory:  Immediate;   Fair Recent;   Fair Remote;   Fair  Judgement:  Fair  Insight:  Fair  Psychomotor Activity:  Normal  Concentration:  Concentration: Fair and Attention Span: Fair  Recall:   [employer]Fair  Fund of Knowledge:  Fair  Language:  Fair  Akathisia:  No  Handed:    AIMS (if indicated):     Assets:  Communication Skills Desire for Improvement Resilience Social Support  ADL's:  Intact  Cognition:  WNL  Sleep:       Mental Status Per Nursing Assessment::   On Admission:     Demographic Factors:  Caucasian and Low socioeconomic status  Loss Factors: Financial problems/change in socioeconomic status  Historical Factors: Impulsivity  Risk Reduction Factors:   Positive coping skills or problem solving skills  Continued Clinical Symptoms:  Alcohol/Substance Abuse/Dependencies More than one psychiatric diagnosis  Cognitive Features That Contribute To Risk:  Closed-mindedness    Suicide Risk:  Minimal: No identifiable suicidal ideation.  Patients presenting with no risk factors but with morbid ruminations; may be classified as minimal risk based on the severity of the depressive symptoms    Plan Of Care/Follow-up recommendations:  Activity:  As tolerated Diet:  Heart healthy with low sodium.  Beau FannyWithrow, Damara Klunder C, FNP 09/20/2016, 1:43 PM

## 2016-09-20 NOTE — ED Notes (Signed)
Patient reports SI with a plan. He states he has been using heroin and attempted to O/d with fentanyl in addition to getting a pistol to shoot himself. Patient denies HI and AVH at this time. Plan of care discussed. Encouragement and support provided and safety maintain. Q 15 min safety checks in place and video monitoring.

## 2016-09-20 NOTE — ED Notes (Signed)
Patient educated about search process and term "contraband " and routine search performed. No contraband found. 

## 2016-09-20 NOTE — [facility] Assessment (Signed)
[facility] Assessment Progress Note  Per Thedore MinsMojeed Akintayo, MD, this pt does not require psychiatric hospitalization at this time.  Pt has reportedly had contact with [facility] this morning; [facility] with recommendation to follow up with this plan.  This has been included in pt's discharge instructions.  Pt's nurse, Diane, has been notified.  John Canninghomas Tanyon Alipio, MA Triage Specialist [phone]

## 2016-09-20 NOTE — ED Notes (Signed)
Bed: WTR5 Expected date:  Expected time:  Means of arrival:  Comments: 

## 2016-09-20 NOTE — BH Assessment (Signed)
Tele Assessment Note   [name] is an 26 y.o. male, Caucasian who presents to [facility] as a walk-in with c/o recent relapse and life situation/trauma with depression as well as recent suicide attempt. Patient states that he resides at [facility] and relapsed, pt allowed to return but encouraged to seek treatment with SI and S.A. Patient states that in past x 3 weeks he has had depression and some life situations then he relapsed making it worse. Patient states that he does sleep less with as little as x 2 hours per night.   Patient acknowledges current SI with plan o.d. Or blow brains out, Pt. Had gun earlier to head and stopped decided come get help and is contemplating why he exists/ reason for living. Patient denies current HI and AVH. Patient has hx. Of S.A. With poly substances most recent was cannabis, last use 09/19/16 3 blunts, Opioids, Fetanil patches and Heroine unspecified amounts last use 09/18/16 and Cocaine started x 2 -3 weeks ago with last use on 09/18/16 for 1 gram or more. Patient was last seen inpatient at [facility] on 7/17 for SI and S.A. Patient is seen outpatient via [facility]. Patient is  in disheveled appearance and is alert and oriented x4. Patient speech was within normal limits and motor behavior appeared normal. Patient thought process is coherent. Patient does not appear to be responding to internal stimuli. Patient was cooperative throughout the assessment and states that he is agreeable to inpatient psychiatric treatment.   Diagnosis: Major Depressive Disorder, Polysubstance Abuse  Past Medical History:  Past Medical History:  Diagnosis Date  . Anxiety   . Attention deficit disorder   . Depression   . Diverticulitis   . Hiccups    pt states hiccups during sleep     Past Surgical History:  Procedure Laterality Date  . colonscopy      removed polyps   . LAPAROSCOPIC PARTIAL COLECTOMY N/A 07/01/2014   Procedure: LAPAROSCOPIC ASSISTED SIGMOID  COLECTOMY WITH MOBILIZATION OF SPLENIC FLEXURE;  Surgeon: Avel Peace, MD;  Location: [facility];  Service: General;  Laterality: N/A;    Family History: No family history on file.  Social History:  reports that he has never smoked. His smokeless tobacco use includes Chew. He reports that he drinks alcohol. He reports that he uses drugs, including "Crack" cocaine, Heroin, Methamphetamines, Marijuana, and Cocaine.  Additional Social History:  Alcohol / Drug Use Pain Medications: SEE MAR Prescriptions: SEE MAR Over the Counter: SEE MAR History of alcohol / drug use?: Yes Longest period of sobriety (when/how long): Since 7/17 17 Meth, Since 7/17 Opioids with recnt relapse Negative Consequences of Use: Financial, Legal, Personal relationships, Work / School Withdrawal Symptoms: Patient aware of relationship between substance abuse and physical/medical complications Substance #1 Name of Substance 1: methamphetamine 1 - Age of First Use: 15 1 - Amount (size/oz): unspecified 1 - Frequency: daily 1 - Duration: years 1 - Last Use / Amount: 11/29/15 Substance #2 Name of Substance 2: Cannabis 2 - Age of First Use: 15 2 - Amount (size/oz): 3 blunts per day 2 - Frequency: daily 2 - Duration: years 2 - Last Use / Amount: 09/18/16 Substance #3 Name of Substance 3: Opioids 3 - Age of First Use: 15 3 - Amount (size/oz): various 3 - Frequency: daily 3 - Duration: years  3 - Last Use / Amount: recent relapse last use 09/19/16  Substance #4 Name of Substance 4: Cocaine 4 - Age of First  Use: 25 4 - Amount (size/oz): 1 gram at a time 4 - Frequency: daily 4 - Duration: x 1 week ago 4 - Last Use / Amount: 09/18/16  CIWA:   COWS:    PATIENT STRENGTHS: (choose at least two) Ability for insight Active sense of humor Capable of independent living Communication skills  Allergies:  Allergies  Allergen Reactions  . Cephalexin Anaphylaxis    Home Medications:  (Not in a hospital  admission)  OB/GYN Status:  No LMP for male patient.  General Assessment Data [facility] Assessment: In system Is this a Tele or Face-to-Face Assessment?: Face-to-Face Is this an Initial Assessment or a Re-assessment for this encounter?: Initial Assessment Marital status: Single Maiden name: n/a Is patient pregnant?: No Pregnancy Status: No Living Arrangements: Other (Comment) ([facility]) Can pt return to current living arrangement?: Yes (pt. must seek treatment prior to return) Admission Status: Voluntary Is patient capable of signing voluntary admission?: Yes Referral Source: Self/Family/Friend Insurance type: none  Medical Screening Exam [facility] Walk[facility] ONLY) Medical Exam completed: Yes  Crisis Care Plan Living Arrangements: Other (Comment) ([facility]) Name of Psychiatrist: none Name of Therapist: [facility], [facility]  Education Status Is patient currently in school?: No Current Grade: n/a Highest grade of school patient has completed: some college Name of school: n/a SolicitorContact person: Syliva Overmanee [facility]  Risk to self with the past 6 months Suicidal Ideation: Yes-Currently Present Has patient been a risk to self within the past 6 months prior to admission? : Yes Suicidal Intent: Yes-Currently Present Has patient had any suicidal intent within the past 6 months prior to admission? : Yes Is patient at risk for suicide?: Yes Suicidal Plan?: Yes-Currently Present Has patient had any suicidal plan within the past 6 months prior to admission? : Yes Specify Current Suicidal Plan: Gun to head, OD Access to Means: Yes Specify Access to Suicidal Means: access to gu/ drugs/pills What has been your use of drugs/alcohol within the last 12 months?: recent relapse polysubstance use Previous Attempts/Gestures: No How many times?: 0 Other Self Harm Risks: drug use Triggers for Past Attempts:  Unpredictable Intentional Self Injurious Behavior: None Family Suicide History: No Recent stressful life event(s): Turmoil (Comment) Persecutory voices/beliefs?: No Depression: Yes Depression Symptoms: Insomnia, Tearfulness, Isolating, Fatigue, Guilt, Loss of interest in usual pleasures, Feeling worthless/self pity Substance abuse history and/or treatment for substance abuse?: Yes Suicide prevention information given to non-admitted patients: Yes  Risk to Others within the past 6 months Homicidal Ideation: No Does patient have any lifetime risk of violence toward others beyond the six months prior to admission? : No Thoughts of Harm to Others: No Comment - Thoughts of Harm to Others: no Current Homicidal Intent: No Current Homicidal Plan: No Access to Homicidal Means: No Identified Victim: none History of harm to others?: No Assessment of Violence: None Noted Violent Behavior Description: n/a Does patient have access to weapons?: No Criminal Charges Pending?: No Does patient have a court date: No Is patient on probation?: No  Psychosis Hallucinations: None noted Delusions: None noted  Mental Status Report Appearance/Hygiene: Disheveled Eye Contact: Fair Motor Activity: Freedom of movement Speech: Logical/coherent Level of Consciousness: Alert Mood: Anxious Affect: Blunted Anxiety Level: Moderate Thought Processes: Coherent, Relevant Judgement: Impaired Orientation: Person, Place, Time, Situation, Appropriate for developmental age Obsessive Compulsive Thoughts/Behaviors: Moderate  Cognitive Functioning Concentration: Decreased Memory: Recent Intact, Remote Intact IQ: Average Insight: Fair Impulse Control: Poor Appetite: Fair Weight  Loss: 0 Weight Gain: 0 Sleep: Decreased Total Hours of Sleep: 2 Vegetative Symptoms: None  ADLScreening [facility] Assessment Services) Patient's cognitive ability adequate to safely complete daily activities?: Yes Patient able to  express need for assistance with ADLs?: Yes Independently performs ADLs?: Yes (appropriate for developmental age)  Prior Inpatient Therapy Prior Inpatient Therapy: Yes Prior Therapy Dates: 11/20/2015 Prior Therapy Facilty/Provider(s): [facility] Reason for Treatment: S.A., SI  Prior Outpatient Therapy Prior Outpatient Therapy: Yes Prior Therapy Dates: current Prior Therapy Facilty/Provider(s): [facility] Reason for Treatment: Depression, S.A. Does patient have an ACCT team?: No Does patient have Intensive In-House Services?  : No Does patient have [facility] services? : Yes Does patient have P4CC services?: No  ADL Screening (condition at time of admission) Patient's cognitive ability adequate to safely complete daily activities?: Yes Is the patient deaf or have difficulty hearing?: No Does the patient have difficulty seeing, even when wearing glasses/contacts?: No Does the patient have difficulty concentrating, remembering, or making decisions?: Yes Patient able to express need for assistance with ADLs?: Yes Does the patient have difficulty dressing or bathing?: No Independently performs ADLs?: Yes (appropriate for developmental age) Does the patient have difficulty walking or climbing stairs?: No Weakness of Legs: None Weakness of Arms/Hands: None       Abuse/Neglect Assessment (Assessment to be complete while patient is alone) Physical Abuse: Denies Verbal Abuse: Denies Sexual Abuse: Denies Exploitation of patient/patient's resources: Denies Self-Neglect: Denies Values / Beliefs Cultural Requests During Hospitalization: None Spiritual Requests During Hospitalization: None   Advance Directives (For Healthcare) Does Patient Have a Medical Advance Directive?: No    Additional Information 1:1 In Past 12 Months?: No CIRT Risk: No Elopement Risk: No Does patient have medical clearance?: No (pt transfer to [facility] Med clearance)     Disposition: Per Donell Sievert, PA meets  inpatient criteria Disposition Initial Assessment Completed for this Encounter: Yes Disposition of Patient: Inpatient treatment program Type of inpatient treatment program: Adult  Hipolito Bayley 09/20/2016 12:31 AM

## 2016-09-20 NOTE — ED Triage Notes (Signed)
Pt states that he's suicidal with a plan of injecting fentanyl and loading a shot gun, neither of which he could go through with Pt is voluntary  Pt also says that he has relapsed on heroin, last used on Sunday

## 2017-01-14 ENCOUNTER — Emergency Department (HOSPITAL_COMMUNITY): Payer: Self-pay

## 2017-01-14 ENCOUNTER — Ambulatory Visit (HOSPITAL_COMMUNITY)
Admission: RE | Admit: 2017-01-14 | Discharge: 2017-01-14 | Disposition: A | Discharge status: Home / Self Care | Payer: No Typology Code available for payment source | Attending: Psychiatry | Admitting: Psychiatry

## 2017-01-14 ENCOUNTER — Emergency Department (HOSPITAL_COMMUNITY)
Admission: EM | Admit: 2017-01-14 | Discharge: 2017-01-14 | Disposition: A | Discharge status: Home / Self Care | Payer: Self-pay | Attending: Emergency Medicine | Admitting: Emergency Medicine

## 2017-01-14 ENCOUNTER — Encounter (HOSPITAL_COMMUNITY): Payer: Self-pay | Admitting: *Deleted

## 2017-01-14 DIAGNOSIS — T401X1A Poisoning by heroin, accidental (unintentional), initial encounter: Secondary | ICD-10-CM | POA: Insufficient documentation

## 2017-01-14 DIAGNOSIS — F909 Attention-deficit hyperactivity disorder, unspecified type: Secondary | ICD-10-CM | POA: Insufficient documentation

## 2017-01-14 DIAGNOSIS — Z79899 Other long term (current) drug therapy: Secondary | ICD-10-CM | POA: Insufficient documentation

## 2017-01-14 DIAGNOSIS — R45851 Suicidal ideations: Secondary | ICD-10-CM

## 2017-01-14 DIAGNOSIS — Z23 Encounter for immunization: Secondary | ICD-10-CM | POA: Insufficient documentation

## 2017-01-14 DIAGNOSIS — F1910 Other psychoactive substance abuse, uncomplicated: Secondary | ICD-10-CM

## 2017-01-14 LAB — RAPID URINE DRUG SCREEN, HOSP PERFORMED
Amphetamines: POSITIVE — AB
BARBITURATES: NOT DETECTED
Benzodiazepines: NOT DETECTED
Cocaine: NOT DETECTED
OPIATES: POSITIVE — AB
TETRAHYDROCANNABINOL: POSITIVE — AB

## 2017-01-14 LAB — ACETAMINOPHEN LEVEL: Acetaminophen (Tylenol), Serum: <10 ug/mL — ABNORMAL LOW (ref 10–30)

## 2017-01-14 LAB — COMPREHENSIVE METABOLIC PANEL
ALBUMIN: 4.1 g/dL (ref 3.5–5.0)
ALK PHOS: 71 U/L (ref 38–126)
ALT: 37 U/L (ref 17–63)
AST: 25 U/L (ref 15–41)
Anion gap: 10 (ref 5–15)
BUN: 14 mg/dL (ref 6–20)
CHLORIDE: 103 mmol/L (ref 101–111)
CO2: 24 mmol/L (ref 22–32)
CREATININE: 0.92 mg/dL (ref 0.61–1.24)
Calcium: 9.2 mg/dL (ref 8.9–10.3)
GFR calc non Af Amer: >60 mL/min (ref >60)
GLUCOSE: 93 mg/dL (ref 65–99)
Potassium: 3.8 mmol/L (ref 3.5–5.1)
Sodium: 137 mmol/L (ref 135–145)
Total Bilirubin: 0.9 mg/dL (ref 0.3–1.2)
Total Protein: 7.1 g/dL (ref 6.5–8.1)

## 2017-01-14 LAB — CBC
HEMATOCRIT: 41.7 % (ref 39.0–52.0)
HEMOGLOBIN: 14.8 g/dL (ref 13.0–17.0)
MCH: 29.1 pg (ref 26.0–34.0)
MCHC: 35.5 g/dL (ref 30.0–36.0)
MCV: 81.9 fL (ref 78.0–100.0)
Platelets: 207 10*3/uL (ref 150–400)
RBC: 5.09 MIL/uL (ref 4.22–5.81)
RDW: 13.4 % (ref 11.5–15.5)
WBC: 11.9 10*3/uL — ABNORMAL HIGH (ref 4.0–10.5)

## 2017-01-14 LAB — ETHANOL: Alcohol, Ethyl (B): <5 mg/dL (ref <5)

## 2017-01-14 LAB — CBG MONITORING, ED: Glucose-Capillary: 104 mg/dL — ABNORMAL HIGH (ref 65–99)

## 2017-01-14 LAB — SALICYLATE LEVEL

## 2017-01-14 MED ORDER — ONDANSETRON HCL 4 MG PO TABS: 4.0000 mg | ORAL_TABLET | Freq: Three times a day (TID) | ORAL | Status: DC | PRN

## 2017-01-14 MED ORDER — TRAZODONE HCL 100 MG PO TABS: 100.0000 mg | ORAL_TABLET | Freq: Every day | ORAL | Status: DC

## 2017-01-14 MED ORDER — SERTRALINE HCL 50 MG PO TABS
50.0000 mg | ORAL_TABLET | Freq: Every day | ORAL | Status: DC
  Administered 2017-01-14: 50 mg via ORAL
  Filled 2017-01-14: qty 1

## 2017-01-14 MED ORDER — NICOTINE 21 MG/24HR TD PT24
21.0000 mg | MEDICATED_PATCH | Freq: Once | TRANSDERMAL | Status: DC
  Administered 2017-01-14: 21 mg via TRANSDERMAL
  Filled 2017-01-14: qty 1

## 2017-01-14 MED ORDER — ACETAMINOPHEN 325 MG PO TABS: 650.0000 mg | ORAL_TABLET | ORAL | Status: DC | PRN

## 2017-01-14 MED ORDER — ALUM & MAG HYDROXIDE-SIMETH 200-200-20 MG/5ML PO SUSP: 30.0000 mL | Freq: Four times a day (QID) | ORAL | Status: DC | PRN

## 2017-01-14 MED ORDER — TETANUS-DIPHTH-ACELL PERTUSSIS 5-2.5-18.5 LF-MCG/0.5 IM SUSP
0.5000 mL | Freq: Once | INTRAMUSCULAR | Status: AC
  Administered 2017-01-14: 0.5 mL via INTRAMUSCULAR
  Filled 2017-01-14: qty 0.5

## 2017-01-14 MED ORDER — QUETIAPINE FUMARATE 300 MG PO TABS: 300.0000 mg | ORAL_TABLET | Freq: Every day | ORAL | Status: DC

## 2017-01-14 NOTE — BH Assessment (Addendum)
Assessment Note  [name] is an 26 y.o. male that presents this date after an unintentional overdose. Patient presents with appropriate affect and is time/place oriented. Patient denies any SI although per note review that patient presented with suicidal ideation earlier this date.  He denies SI/HI/AVH at present.  Patient presents in the ED with his father seeking admission to [facility].  Patient states that he relapsed last night on $40 worth of heroin after having had a verbal altercation on the phone with his mother.  He states that his mother was putting him down because she thinks that he should be further along in his life than what he is and she also criticized him concerning his addiction.  Patient states that after the conversation he felt hopeless and he admits that he has no coping strategies, so he ended up using and overdosed.  He states that he was hopeless, but denies being suicidal.    [name] states that he has been using heroin, IV, since the age of 72. He states that he was using one gram daily prior to December 2017 when [facility] entered [facility].  He states that he was in this program for 2.5 months and then transferred to [facility]  He states that he stayed clean there for 4-5 months.  During his stay in this program, he states that his girlfriend and he broke up and he relapsed and became suicidal. He presented at [facility] 09/19/16 and was admitted for his suicidal ideation with a plan to "shoot my brains out."  He states that he was treated for his depression and he was discharged home to family.  Patient states that he was prescribed Seroquel 300 mg po qhs, Trazodone 100 mg po qhs and Zoloft 50 mg po qd. He was instructed to follow-up at [facility] and states that he has been taking his medications as prescribed since his release from [facility].  However, he states that he does not like the services at [facility] and his medication compliance is  questionable. [name] states that he likes [facility] better than [facility] describing it as his "safe place."  He feels like he needs to be admitted to [facility] in order to get his medications regulated.  He states that he is on a last chance agreement to return to live with his aunt.  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  He states that he does well when he keeps his mind busy.  Patient presented earlier this date as a walk-in at [facility].  Per that note,  Patient completed walk in form and indicated recent overdose on heroin, at 0900. The patient reports EMS was called to the home and '' they had to give me Narcan. '' he reports he denied wanting to come to the hospital with EMS initially, but then changed his mind as he states '' I really want some help. I am suicidal.'' Patient was asked to present to [facility]. Patient was transported to [facility] to complete evaluation and medical clearance.  The patient reported a hx of Depression, Bipolar and PTSD. Reports he feels his meds aren't working and he would like help with S/A and depression.   Case was staffed with Elta Guadeloupe, FNP who recommended that patient be discharged with outpatient resources and he did not meet criteria for inpatient admission.   Diagnosis: MDD Recurrent Without Psychotic Features, Moderate,  Opioid Use Disorder   Past Medical History:  Past Medical History:  Diagnosis Date  . Anxiety   . Attention deficit disorder   . Depression   . Diverticulitis   . Hiccups    pt states hiccups during sleep     Past Surgical History:  Procedure Laterality Date  . colonscopy      removed polyps   . LAPAROSCOPIC PARTIAL COLECTOMY N/A 07/01/2014   Procedure: LAPAROSCOPIC ASSISTED SIGMOID COLECTOMY WITH MOBILIZATION OF SPLENIC FLEXURE;  Surgeon: Avel Peace, MD;  Location: [facility];  Service: General;  Laterality: N/A;    Family History: No family history on file.  Social History:   reports that he has never smoked. His smokeless tobacco use includes Chew. He reports that he drinks alcohol. He reports that he uses drugs, including "Crack" cocaine, Heroin, Methamphetamines, Marijuana, and Cocaine.  Additional Social History:  Alcohol / Drug Use Pain Medications: see MAR Prescriptions: see MAR Over the Counter: see MAR History of alcohol / drug use?: Yes Longest period of sobriety (when/how long): 4-5 months in 2018 Negative Consequences of Use: Personal relationships, Financial, Legal Withdrawal Symptoms:  (currently denies) Substance #1 Name of Substance 1: Heroin 1 - Age of First Use: 15 1 - Amount (size/oz): 1 gram daily prior to December 2017.    Was abstinent for 4 monhs and relapsed and was abstinent again for the past 4-5 months and relapsed last night 1 - Frequency: history of daily use, relapsed on one occasion recnetly 1 - Duration: 11 years 1 - Last Use / Amount: 01/13/17  useed $40 worth  CIWA: CIWA-Ar BP: 117/72 Pulse Rate: 82 COWS:    Allergies:  Allergies  Allergen Reactions  . Cephalexin Anaphylaxis    Home Medications:  (Not in a hospital admission)  OB/GYN Status:  No LMP for male patient.  General Assessment Data [facility] of [facility] TTS Assessment: In system Is this a Tele or Face-to-Face Assessment?: Face-to-Face Is this an Initial Assessment or a Re-assessment for this encounter?: Initial Assessment Marital status: Single Maiden name: N/A Is patient pregnant?: No Pregnancy Status: No Living Arrangements: Other relatives (aunt) Can pt return to current living arrangement?: Yes Admission Status: Voluntary Is patient capable of signing voluntary admission?: Yes Referral Source: Self/Family/Friend Insurance type: self-pay  Medical Screening Exam [facility] Walk-in ONLY) Medical Exam completed: Yes  Crisis Care Plan Living Arrangements: Other relatives (aunt) Legal Guardian:  (N/A) Name of Psychiatrist: Transport planner Name of  Therapist: none  Education Status Is patient currently in school?: No Current Grade:  (N/A) Highest grade of school patient has completed: 12 Name of school:  (NA) Contact person: NA  Risk to self with the past 6 months Suicidal Ideation: No Has patient been a risk to self within the past 6 months prior to admission? : Yes Suicidal Intent: No Has patient had any suicidal intent within the past 6 months prior to admission? : No Suicidal Plan?: No Has patient had any suicidal plan within the past 6 months prior to admission? : Yes Access to Means: No What has been your use of drugs/alcohol within the last 12 months?: current use Previous Attempts/Gestures: No How many times?: 0 Other Self Harm Risks: NA Triggers for Past Attempts: Unknown Intentional Self Injurious Behavior: None Family Suicide History: No Recent stressful life event(s): Other (Comment) (relationship issues) Persecutory voices/beliefs?: No Depression: Yes Depression Symptoms: Feeling worthless/self pity Substance abuse history and/or treatment for substance abuse?: Yes Suicide prevention information given to non-admitted patients:  Not applicable  Risk to Others within the past 6 months Homicidal Ideation: No Does patient have any lifetime risk of violence toward others beyond the six months prior to admission? : No Thoughts of Harm to Others: No Current Homicidal Intent: No Current Homicidal Plan: No Access to Homicidal Means: No Identified Victim: NA History of harm to others?: No Assessment of Violence: None Noted Violent Behavior Description: NA Does patient have access to weapons?: No Criminal Charges Pending?: No Does patient have a court date: No Is patient on probation?: No  Psychosis Hallucinations: None noted Delusions: None noted  Mental Status Report Appearance/Hygiene: In scrubs Eye Contact: Fair Motor Activity: Freedom of movement Speech: Unremarkable Level of Consciousness:  Alert Mood: Depressed Affect: Appropriate to circumstance Anxiety Level: Minimal Thought Processes: Coherent Judgement: Unimpaired Orientation: Person, Place, Time, Situation Obsessive Compulsive Thoughts/Behaviors: None  Cognitive Functioning Concentration: Normal Memory: Recent Intact, Remote Intact IQ: Average Insight: Fair Impulse Control: Poor Appetite: Good Weight Loss: 0 Weight Gain: 0 Sleep: No Change Total Hours of Sleep: 7 Vegetative Symptoms: None  ADLScreening [facility] Assessment Services) Patient's cognitive ability adequate to safely complete daily activities?: Yes Patient able to express need for assistance with ADLs?: Yes Independently performs ADLs?: Yes (appropriate for developmental age)  Prior Inpatient Therapy Prior Inpatient Therapy: Yes Prior Therapy Dates: 2018 [facility] Reason for Treatment: MH, SA issues  Prior Outpatient Therapy Prior Outpatient Therapy: No Prior Therapy Dates: NA Prior Therapy Facilty/Provider(s): [facility] Reason for Treatment: Med Management Does patient have an ACCT team?: No Does patient have Intensive In-House Services?  : No Does patient have [facility] services? : Yes Does patient have P4CC services?: No  ADL Screening (condition at time of admission) Patient's cognitive ability adequate to safely complete daily activities?: Yes Is the patient deaf or have difficulty hearing?: No Does the patient have difficulty seeing, even when wearing glasses/contacts?: No Does the patient have difficulty concentrating, remembering, or making decisions?: No Patient able to express need for assistance with ADLs?: Yes Does the patient have difficulty dressing or bathing?: No Independently performs ADLs?: Yes (appropriate for developmental age) Does the patient have difficulty walking or climbing stairs?: No Weakness of Legs: None Weakness of Arms/Hands: None  Home Assistive Devices/Equipment Home Assistive  Devices/Equipment: None  Therapy Consults (therapy consults require a physician order) PT Evaluation Needed: No OT Evalulation Needed: No SLP Evaluation Needed: No Abuse/Neglect Assessment (Assessment to be complete while patient is alone) Physical Abuse: Denies Verbal Abuse: Denies Sexual Abuse: Denies Exploitation of patient/patient's resources: Denies Self-Neglect: Denies Values / Beliefs Cultural Requests During Hospitalization: None Spiritual Requests During Hospitalization: None Consults Spiritual Care Consult Needed: No Social Work Consult Needed: No Merchant navy officerAdvance Directives (For Healthcare) Does Patient Have a Medical Advance Directive?: No Would patient like information on creating a medical advance directive?: No - Patient declined    Additional Information 1:1 In Past 12 Months?: No CIRT Risk: No Elopement Risk: No Does patient have medical clearance?: No     Disposition:  Case was staffed with Elta GuadeloupeLaurie Parks, FNP who recommended that patient be discharged with outpatient resources and he did not meet criteria for inpatient admission.    Disposition Initial Assessment Completed for this Encounter: Yes Disposition of Patient: Other dispositions Other disposition(s): Other (Comment) (Patient to be discharged with outpatient resources)  On Site Evaluation by:   Reviewed with Physician:    Arnoldo Lenisanny J Layten Aiken 01/14/2017 2:11 PM

## 2017-01-14 NOTE — ED Notes (Signed)
Bed: [id number] Expected date: 01/14/17 Expected time:  Means of arrival:  Comments: HALL D

## 2017-01-14 NOTE — ED Notes (Signed)
Bed: [facility]4 Expected date:  Expected time:  Means of arrival:  Comments: 

## 2017-01-14 NOTE — ED Notes (Signed)
Patient alert and oriented.  He is requesting d/c.  TTS counselor speaking with EDP.  Comfort measures offered and 15' checks cont for safety.

## 2017-01-14 NOTE — ED Provider Notes (Signed)
[facility]-EMERGENCY DEPT Provider Note   CSN: [id number] Arrival date & time: 01/14/17  1043     History   Chief Complaint Chief Complaint  Patient presents with  . Drug Overdose  . Suicidal    HPI [name] is a 26 y.o. male.  HPI   26 yo M with PMHx of ADD, depression here with worsening SI and drug use. Pt states he has been under more stress recently. He has h/o IVDU and last used >6 months ago. He has been under more stress and last night, the stress "got to him." He states he began using heroin last night. He overdose and reportedly was found unconscious, requiring heroin. He did nto want help at that time and refused EMS. He went to [facility] today with worsening depression, thoughts to kill himself via OD and was sent here for med clearance. He did strike his face after OD'ing. Denies any loose teeth but has mild frotnal tooth pain. Mild nasal pain. No septal hematoma.  Past Medical History:  Diagnosis Date  . Anxiety   . Attention deficit disorder   . Depression   . Diverticulitis   . Hiccups    pt states hiccups during sleep     Patient Active Problem List   Diagnosis Date Noted  . Polysubstance abuse   . Recurrent major depression-severe (HCC) 08/07/2016  . Substance induced mood disorder (HCC)   . Atypical depression   . Polysubstance (including opioids) dependence with physiol dependence (HCC) 08/23/2014  . Sigmoid diverticulitis s/p lap assisted sigmoid colectomy 07/01/14 07/01/2014  . Diverticulitis 12/06/2013  . Diverticulitis of large intestine without perforation or abscess without bleeding 12/06/2013  . Obesity, unspecified 12/06/2013  . Acute diverticulitis 12/06/2013  . NAUSEA AND VOMITING 02/09/2009  . OTHER SYMPTOMS INVOLVING DIGESTIVE SYSTEM OTHER 02/09/2009    Past Surgical History:  Procedure Laterality Date  . colonscopy      removed polyps   . LAPAROSCOPIC PARTIAL COLECTOMY N/A 07/01/2014   Procedure: LAPAROSCOPIC ASSISTED SIGMOID COLECTOMY WITH  MOBILIZATION OF SPLENIC FLEXURE;  Surgeon: Avel Peace, MD;  Location: [facility] ORS;  Service: General;  Laterality: N/A;       Home Medications    Prior to Admission medications   Medication Sig Start Date End Date Taking? Authorizing Provider  hydrOXYzine (ATARAX/VISTARIL) 25 MG tablet Take 1 tablet (25 mg total) by mouth every 6 (six) hours as needed for anxiety. Patient not taking: Reported on 09/20/2016 08/12/16   Armandina Stammer I, NP  nicotine (NICODERM CQ - DOSED IN MG/24 HOURS) 21 mg/24hr patch Place 1 patch (21 mg total) onto the skin daily. For smoking cessation Patient not taking: Reported on 09/20/2016 08/13/16   Armandina Stammer I, NP  prazosin (MINIPRESS) 2 MG capsule Take 1 capsule (2 mg total) by mouth at bedtime. For nightmares Patient not taking: Reported on 09/20/2016 08/12/16   Armandina Stammer I, NP  QUEtiapine (SEROQUEL) 300 MG tablet Take 1 tablet (300 mg total) by mouth at bedtime. For mood control 08/12/16   Armandina Stammer I, NP  QUEtiapine (SEROQUEL) 300 MG tablet Take 1 tablet (300 mg total) by mouth at bedtime. 09/20/16   Withrow, Everardo All, FNP  sertraline (ZOLOFT) 50 MG tablet Take 1 tablet (50 mg total) by mouth daily. For depression 08/12/16   Armandina Stammer I, NP  sertraline (ZOLOFT) 50 MG tablet Take 1 tablet (50 mg total) by mouth daily. 09/21/16   Withrow, Everardo All, FNP  traZODone (DESYREL) 100 MG tablet Take 1 tablet (  100 mg total) by mouth at bedtime. For sleep 08/12/16   Armandina Stammer I, NP  traZODone (DESYREL) 100 MG tablet Take 1 tablet (100 mg total) by mouth at bedtime. 09/20/16   Withrow, Everardo All, FNP    Family History No family history on file.  Social History Social History  Substance Use Topics  . Smoking status: Never Smoker  . Smokeless tobacco: Current User    Types: Chew  . Alcohol use Yes     Comment: occasionally     Allergies   Cephalexin   Review of Systems Review of Systems  Constitutional: Negative for chills, fatigue and fever.  HENT: Negative for  congestion and rhinorrhea.   Eyes: Negative for visual disturbance.  Respiratory: Negative for cough, shortness of breath and wheezing.   Cardiovascular: Negative for chest pain and leg swelling.  Gastrointestinal: Negative for abdominal pain, diarrhea, nausea and vomiting.  Genitourinary: Negative for dysuria and flank pain.  Musculoskeletal: Negative for neck pain and neck stiffness.  Skin: Positive for wound. Negative for rash.  Allergic/Immunologic: Negative for immunocompromised state.  Neurological: Positive for headaches. Negative for syncope and weakness.  Psychiatric/Behavioral: Positive for suicidal ideas.  All other systems reviewed and are negative.    Physical Exam Updated Vital Signs BP 117/72   Pulse 82   Temp 98.3 F (36.8 C)   Resp 17   SpO2 94%   Physical Exam  Constitutional: He is oriented to person, place, and time. He appears well-developed and well-nourished. No distress.  HENT:  Head: Normocephalic and atraumatic.  Superficial abrasion to mid upper forehead, no deep lacerations. Abrasions to upper lip with mod TTP over upper lip. No septal hematoma. No epistaxis. Possible symmetric small chip fx to bilateral central incisors. No laxity or subluxation.  Eyes: Conjunctivae are normal.  Neck: Neck supple.  Cardiovascular: Normal rate, regular rhythm and normal heart sounds.  Exam reveals no friction rub.   No murmur heard. Pulmonary/Chest: Effort normal and breath sounds normal. No respiratory distress. He has no wheezes. He has no rales.  Abdominal: He exhibits no distension.  Musculoskeletal: He exhibits no edema.  Neurological: He is alert and oriented to person, place, and time. He exhibits normal muscle tone.  Skin: Skin is warm. Capillary refill takes less than 2 seconds.  Psychiatric: He exhibits a depressed mood. He expresses suicidal ideation.  Nursing note and vitals reviewed.    ED Treatments / Results  Labs (all labs ordered are listed,  but only abnormal results are displayed) Labs Reviewed  ACETAMINOPHEN LEVEL - Abnormal; Notable for the following:       Result Value   Acetaminophen (Tylenol), Serum <10 (*)    All other components within normal limits  CBC - Abnormal; Notable for the following:    WBC 11.9 (*)    All other components within normal limits  RAPID URINE DRUG SCREEN, HOSP PERFORMED - Abnormal; Notable for the following:    Opiates POSITIVE (*)    Amphetamines POSITIVE (*)    Tetrahydrocannabinol POSITIVE (*)    All other components within normal limits  CBG MONITORING, ED - Abnormal; Notable for the following:    Glucose-Capillary 104 (*)    All other components within normal limits  COMPREHENSIVE METABOLIC PANEL  ETHANOL  SALICYLATE LEVEL    EKG  EKG Interpretation  Date/Time:  Saturday January 14 2017 10:50:27 EDT Ventricular Rate:  85 PR Interval:    QRS Duration: 89 QT Interval:  369 QTC Calculation: 439 R  Axis:   87 Text Interpretation:  Sinus rhythm Borderline short PR interval Nonspecific T wave abnormality No significant change since last tracing Confirmed by Doug SouJacubowitz, Sam [phone]([id number]) on 01/14/2017 11:12:16 AM       Radiology Ct Head Wo Contrast  Result Date: 01/14/2017 CLINICAL DATA:  Forehead abrasions and epistaxis after falling and hitting his face this morning during a heroin overdose. EXAM: CT HEAD WITHOUT CONTRAST CT MAXILLOFACIAL WITHOUT CONTRAST TECHNIQUE: Multidetector CT imaging of the head and maxillofacial structures were performed using the standard protocol without intravenous contrast. Multiplanar CT image reconstructions of the maxillofacial structures were also generated. COMPARISON:  None. FINDINGS: CT HEAD FINDINGS Brain: No evidence of acute infarction, hemorrhage, hydrocephalus, extra-axial collection or mass lesion/mass effect. Vascular: No hyperdense vessel or unexpected calcification. Skull: Normal. Negative for fracture or focal lesion. Other: None. CT  MAXILLOFACIAL FINDINGS Osseous: No fracture or mandibular dislocation. No destructive process. Orbits: Negative. No traumatic or inflammatory finding. Sinuses: Mild bilateral ethmoid and maxillary sinus mucosal thickening. Hypoplastic frontal sinuses. Soft tissues: Unremarkable. IMPRESSION: 1. No skull fracture or intracranial hemorrhage. 2. No maxillofacial fracture. 3. Mild chronic bilateral ethmoid and maxillary sinusitis. Electronically Signed   By: Beckie SaltsSteven  Reid M.D.   On: 01/14/2017 12:45   Ct Maxillofacial Wo Contrast  Result Date: 01/14/2017 CLINICAL DATA:  Forehead abrasions and epistaxis after falling and hitting his face this morning during a heroin overdose. EXAM: CT HEAD WITHOUT CONTRAST CT MAXILLOFACIAL WITHOUT CONTRAST TECHNIQUE: Multidetector CT imaging of the head and maxillofacial structures were performed using the standard protocol without intravenous contrast. Multiplanar CT image reconstructions of the maxillofacial structures were also generated. COMPARISON:  None. FINDINGS: CT HEAD FINDINGS Brain: No evidence of acute infarction, hemorrhage, hydrocephalus, extra-axial collection or mass lesion/mass effect. Vascular: No hyperdense vessel or unexpected calcification. Skull: Normal. Negative for fracture or focal lesion. Other: None. CT MAXILLOFACIAL FINDINGS Osseous: No fracture or mandibular dislocation. No destructive process. Orbits: Negative. No traumatic or inflammatory finding. Sinuses: Mild bilateral ethmoid and maxillary sinus mucosal thickening. Hypoplastic frontal sinuses. Soft tissues: Unremarkable. IMPRESSION: 1. No skull fracture or intracranial hemorrhage. 2. No maxillofacial fracture. 3. Mild chronic bilateral ethmoid and maxillary sinusitis. Electronically Signed   By: Beckie SaltsSteven  Reid M.D.   On: 01/14/2017 12:45    Procedures Procedures (including critical care time)  Medications Ordered in ED Medications  acetaminophen (TYLENOL) tablet 650 mg (not administered)  alum &  mag hydroxide-simeth (MAALOX/MYLANTA) 200-200-20 MG/5ML suspension 30 mL (not administered)  ondansetron (ZOFRAN) tablet 4 mg (not administered)  traZODone (DESYREL) tablet 100 mg (not administered)  sertraline (ZOLOFT) tablet 50 mg (50 mg Oral Given 01/14/17 1314)  QUEtiapine (SEROQUEL) tablet 300 mg (not administered)  nicotine (NICODERM CQ - dosed in mg/24 hours) patch 21 mg (21 mg Transdermal Patch Applied 01/14/17 1315)  Tdap (BOOSTRIX) injection 0.5 mL (0.5 mLs Intramuscular Given 01/14/17 1205)     Initial Impression / Assessment and Plan / ED Course  I have reviewed the triage vital signs and the nursing notes.  Pertinent labs & imaging results that were available during my care of the patient were reviewed by me and considered in my medical decision making (see chart for details).     26 yo M here with IVDU, SI. Regarding his IVDU, he is >8 hours s/p last injection and is HDS, well appearing, breathing without difficulty. Lab work reassuring. Imaging neg. Tetanus updated, will dress wounds and consult TTS.  Final Clinical Impressions(s) / ED Diagnoses   Final diagnoses:  Suicidal  ideation  Accidental overdose of heroin, initial encounter  Polysubstance abuse    New Prescriptions New Prescriptions   No medications on file     Shaune Pollack, MD 01/14/17 1451

## 2017-01-14 NOTE — ED Provider Notes (Signed)
Patient evaluated by TTS and is safe/cleared for d/c from psych perspective. Pt declining any SI, HI at this time. He is goal-oriented, states that he has family at home to help support him. Given medical and psych clearance, d/c'ed home. Resources provided.   Shaune PollackIsaacs, John Luse, MD 01/14/17 2020

## 2017-01-14 NOTE — ED Triage Notes (Signed)
EMS reports pt OD'ed this am, EMS responded and gave Narcan, Pt refused transport, [facility] and they called EMS sending him here.

## 2017-01-14 NOTE — BH Assessment (Signed)
[facility]. Patient completed walk in form and indicated recent overdose on heroin, at 0900. The patient reports EMS was called to the home and '' they had to give me Narcan. '' he reports he denied wanting to come to the hospital with EMS initially, but then changed his mind as he states '' I really want some help. I am suicidal.'' The patient arrived to lobby covered in blood, with active bleeding noted to scalp from small laceration at the top of his head. He also reported lip laceration, unsure of how happened during overdose. The patient also had dried blood covering his face and clothing. The patient reported a hx of Depression, Bipolar and PTSD. Reports he feels his meds aren't working and he would like help with S/A and depression. Vital signs obtained and WNL. Phoned EMS due to recent overdose as patient requiring immediate medical attention. Vitals obtained , copy of walk in form given to EMS paramedics. Father in lobby with patient followed out to EMS. Pt will be seen with TTS in [facility].

## 2017-01-14 NOTE — ED Triage Notes (Signed)
Pt states he intentionally overdosed on heroine this morning. Pt states he was clean for 6-7 months efore last night. Pt states he did have SI before the overdose and would like help. Pt denies HI or AV hallucinations.

## 2017-01-14 NOTE — BH Assessment (Signed)
[facility] Assessment Progress Note    Case was staffed with Elta GuadeloupeLaurie Parks, FNP who recommended that patient be discharged with outpatient resources and he did not meet criteria for inpatient admission.

## 2017-02-12 ENCOUNTER — Encounter (HOSPITAL_COMMUNITY): Payer: Self-pay | Admitting: *Deleted

## 2017-02-12 ENCOUNTER — Inpatient Hospital Stay (HOSPITAL_COMMUNITY)
Admission: RE | Admit: 2017-02-12 | Discharge: 2017-02-16 | DRG: 885 | Disposition: A | Discharge status: Other Acute Inpatient Hospital | Payer: Federal, State, Local not specified - Other | Attending: Psychiatry | Admitting: Psychiatry

## 2017-02-12 DIAGNOSIS — G2581 Restless legs syndrome: Secondary | ICD-10-CM | POA: Diagnosis not present

## 2017-02-12 DIAGNOSIS — F319 Bipolar disorder, unspecified: Secondary | ICD-10-CM | POA: Diagnosis not present

## 2017-02-12 DIAGNOSIS — F17220 Nicotine dependence, chewing tobacco, uncomplicated: Secondary | ICD-10-CM | POA: Diagnosis present

## 2017-02-12 DIAGNOSIS — B1920 Unspecified viral hepatitis C without hepatic coma: Secondary | ICD-10-CM | POA: Diagnosis present

## 2017-02-12 DIAGNOSIS — F1290 Cannabis use, unspecified, uncomplicated: Secondary | ICD-10-CM | POA: Diagnosis not present

## 2017-02-12 DIAGNOSIS — R7401 Elevation of levels of liver transaminase levels: Secondary | ICD-10-CM

## 2017-02-12 DIAGNOSIS — Z560 Unemployment, unspecified: Secondary | ICD-10-CM | POA: Diagnosis not present

## 2017-02-12 DIAGNOSIS — F988 Other specified behavioral and emotional disorders with onset usually occurring in childhood and adolescence: Secondary | ICD-10-CM | POA: Diagnosis present

## 2017-02-12 DIAGNOSIS — K5900 Constipation, unspecified: Secondary | ICD-10-CM | POA: Diagnosis not present

## 2017-02-12 DIAGNOSIS — I951 Orthostatic hypotension: Secondary | ICD-10-CM | POA: Diagnosis not present

## 2017-02-12 DIAGNOSIS — Z915 Personal history of self-harm: Secondary | ICD-10-CM | POA: Diagnosis not present

## 2017-02-12 DIAGNOSIS — Z79899 Other long term (current) drug therapy: Secondary | ICD-10-CM

## 2017-02-12 DIAGNOSIS — Z590 Homelessness: Secondary | ICD-10-CM

## 2017-02-12 DIAGNOSIS — R740 Nonspecific elevation of levels of transaminase and lactic acid dehydrogenase [LDH]: Secondary | ICD-10-CM | POA: Diagnosis not present

## 2017-02-12 DIAGNOSIS — F1490 Cocaine use, unspecified, uncomplicated: Secondary | ICD-10-CM | POA: Diagnosis not present

## 2017-02-12 DIAGNOSIS — F1910 Other psychoactive substance abuse, uncomplicated: Secondary | ICD-10-CM | POA: Diagnosis not present

## 2017-02-12 DIAGNOSIS — F419 Anxiety disorder, unspecified: Secondary | ICD-10-CM | POA: Diagnosis present

## 2017-02-12 LAB — CBC
HEMATOCRIT: 44 % (ref 39.0–52.0)
HEMOGLOBIN: 15.3 g/dL (ref 13.0–17.0)
MCH: 29.2 pg (ref 26.0–34.0)
MCHC: 34.8 g/dL (ref 30.0–36.0)
MCV: 84 fL (ref 78.0–100.0)
Platelets: 259 10*3/uL (ref 150–400)
RBC: 5.24 MIL/uL (ref 4.22–5.81)
RDW: 13.9 % (ref 11.5–15.5)
WBC: 9.8 10*3/uL (ref 4.0–10.5)

## 2017-02-12 LAB — COMPREHENSIVE METABOLIC PANEL
ALBUMIN: 4.3 g/dL (ref 3.5–5.0)
ALK PHOS: 110 U/L (ref 38–126)
ALT: 382 U/L — ABNORMAL HIGH (ref 17–63)
ANION GAP: 9 (ref 5–15)
AST: 163 U/L — AB (ref 15–41)
BILIRUBIN TOTAL: 0.9 mg/dL (ref 0.3–1.2)
BUN: 16 mg/dL (ref 6–20)
CALCIUM: 9.8 mg/dL (ref 8.9–10.3)
CO2: 27 mmol/L (ref 22–32)
Chloride: 99 mmol/L — ABNORMAL LOW (ref 101–111)
Creatinine, Ser: 1 mg/dL (ref 0.61–1.24)
GFR calc Af Amer: >60 mL/min (ref >60)
GFR calc non Af Amer: >60 mL/min (ref >60)
GLUCOSE: 141 mg/dL — AB (ref 65–99)
Potassium: 3.8 mmol/L (ref 3.5–5.1)
SODIUM: 135 mmol/L (ref 135–145)
TOTAL PROTEIN: 8.2 g/dL — AB (ref 6.5–8.1)

## 2017-02-12 MED ORDER — MAGNESIUM HYDROXIDE 400 MG/5ML PO SUSP: 30.0000 mL | Freq: Every day | ORAL | Status: DC | PRN

## 2017-02-12 MED ORDER — HYDROXYZINE HCL 25 MG PO TABS
25.0000 mg | ORAL_TABLET | Freq: Three times a day (TID) | ORAL | Status: DC | PRN
  Administered 2017-02-12 – 2017-02-15 (×5): 25 mg via ORAL
  Filled 2017-02-12 (×5): qty 1

## 2017-02-12 MED ORDER — INFLUENZA VAC SPLIT QUAD 0.5 ML IM SUSY
0.5000 mL | PREFILLED_SYRINGE | INTRAMUSCULAR | Status: DC
  Filled 2017-02-12: qty 0.5

## 2017-02-12 MED ORDER — NICOTINE 21 MG/24HR TD PT24
21.0000 mg | MEDICATED_PATCH | Freq: Every day | TRANSDERMAL | Status: DC
  Administered 2017-02-12 – 2017-02-15 (×4): 21 mg via TRANSDERMAL
  Filled 2017-02-12 (×5): qty 1

## 2017-02-12 MED ORDER — TRAZODONE HCL 50 MG PO TABS
50.0000 mg | ORAL_TABLET | Freq: Every evening | ORAL | Status: DC | PRN
  Administered 2017-02-12 – 2017-02-14 (×3): 50 mg via ORAL
  Filled 2017-02-12 (×2): qty 1

## 2017-02-12 MED ORDER — ACETAMINOPHEN 325 MG PO TABS
650.0000 mg | ORAL_TABLET | Freq: Four times a day (QID) | ORAL | Status: DC | PRN
  Administered 2017-02-12: 650 mg via ORAL
  Filled 2017-02-12: qty 2

## 2017-02-12 MED ORDER — ALUM & MAG HYDROXIDE-SIMETH 200-200-20 MG/5ML PO SUSP: 30.0000 mL | ORAL | Status: DC | PRN

## 2017-02-12 NOTE — BH Assessment (Addendum)
Assessment Note  [name] is an 26 y.o. male  who presented at [facility] on his own requesting assistance with his depression and SA issues.  He states that he was just here a couple weeks ago due to an intentional overdose in an attempt to kill himself and states that he was dischagred to follow-up at [facility] which he states that he did.  He reports that he was restarted on medications, but states that his mood continues to worsen and he states that he is depressed, feels hopeless and helpless.  Patient states that he he is having thoughts of overdosing again. He states that he has been trying to help himself by trying to stay clean and states that he has not used heroin in three days and methamphetamine in the past five days.  Patient states that he is currently homeless and recently lost his job which has made things worse for him.  Patient is alert an oriented, he is somewhat anxious and has a depressed mood.  He states that he is not eating and has weight loss of 30-40 lobs in the past few months.  He states that he is only sleeping 2-3 hours per night.  He states that he has been hearing a voice inside his head telling him to self-sabotage and states that he has been seeing shadows.  Patient states that he has been using heroin daily since the age of 36  and states that he has been using meth-ampheatamines daily for the past two years.  He states that he completed eight months of treatment in a long term program and states that he was able to stay clean for four months after completing the program, but subsequently relapsed.  He states that he would like to get his moods stabilized and be referred to a long term program because he states that if he does not get the help he needs that he is going to die.  Diagnosis: Bipolar Disorder  Past Medical History:  Past Medical History:  Diagnosis Date  . Anxiety   . Attention deficit disorder   . Depression   . Diverticulitis   . Hiccups    pt  states hiccups during sleep     Past Surgical History:  Procedure Laterality Date  . colonscopy      removed polyps   . LAPAROSCOPIC PARTIAL COLECTOMY N/A 07/01/2014   Procedure: LAPAROSCOPIC ASSISTED SIGMOID COLECTOMY WITH MOBILIZATION OF SPLENIC FLEXURE;  Surgeon: Avel Peace, MD;  Location: [facility] ORS;  Service: General;  Laterality: N/A;    Family History: No family history on file.  Social History:  reports that he has never smoked. His smokeless tobacco use includes Chew. He reports that he drinks alcohol. He reports that he uses drugs, including "Crack" cocaine, Heroin, Methamphetamines, Marijuana, and Cocaine.  Additional Social History:  Alcohol / Drug Use Pain Medications: denies Prescriptions: denies Over the Counter: denies History of alcohol / drug use?: Yes Longest period of sobriety (when/how long): pt states that he has minimal clean time Negative Consequences of Use: Financial, Personal relationships, Work / Programmer, multimedia (lost job and homeless) Withdrawal Symptoms:  (none reported) Substance #1 Name of Substance 1: heroin 1 - Age of First Use: 16 1 - Amount (size/oz): gram 1 - Frequency: daily 1 - Duration: 10 years 1 - Last Use / Amount: last use was 3 days ago Substance #2 Name of Substance 2: methamphetamine 2 - Age of First Use: 24 2 - Amount (size/oz): 1/2 gram 2 -  Frequency: daily 2 - Duration: 2 years 2 - Last Use / Amount: 5 days ago  CIWA: CIWA-Ar BP: 117/79 Pulse Rate: (!) 109 COWS:    Allergies:  Allergies  Allergen Reactions  . Cephalexin Anaphylaxis    Home Medications:  Medications Prior to Admission  Medication Sig Dispense Refill  . hydrOXYzine (ATARAX/VISTARIL) 25 MG tablet Take 1 tablet (25 mg total) by mouth every 6 (six) hours as needed for anxiety. (Patient not taking: Reported on 09/20/2016) 60 tablet 0  . nicotine (NICODERM CQ - DOSED IN MG/24 HOURS) 21 mg/24hr patch Place 1 patch (21 mg total) onto the skin daily. For smoking  cessation (Patient not taking: Reported on 09/20/2016) 28 patch 0  . QUEtiapine (SEROQUEL) 300 MG tablet Take 1 tablet (300 mg total) by mouth at bedtime. 30 tablet 0  . sertraline (ZOLOFT) 50 MG tablet Take 1 tablet (50 mg total) by mouth daily. 30 tablet 0  . traZODone (DESYREL) 100 MG tablet Take 1 tablet (100 mg total) by mouth at bedtime. 30 tablet 0    OB/GYN Status:  No LMP for male patient.  General Assessment Data Location of Assessment: [facility] Assessment Services TTS Assessment: In system Is this a Tele or Face-to-Face Assessment?: Face-to-Face Is this an Initial Assessment or a Re-assessment for this encounter?: Initial Assessment Marital status: Single Maiden name:  (N/A) Is patient pregnant?: No Pregnancy Status: No Living Arrangements: Alone (homeless) Can pt return to current living arrangement?: Yes Admission Status: Voluntary Is patient capable of signing voluntary admission?: Yes Referral Source: Self/Family/Friend Insurance type: Software engineer Exam [facility] Walk-in ONLY) Medical Exam completed: Yes  Crisis Care Plan Living Arrangements: Alone (homeless) Legal Guardian: Other: (none) Name of Psychiatrist:  Museum/gallery curator, psychiatrist unknown) Name of Therapist:  (does not have one)  Education Status Is patient currently in school?: No Current Grade:  (not assessed) Highest grade of school patient has completed:  (not assessed) Name of school: unknown Contact person: N/A  Risk to self with the past 6 months Suicidal Ideation: Yes-Currently Present Has patient been a risk to self within the past 6 months prior to admission? : Yes Suicidal Intent: Yes-Currently Present Has patient had any suicidal intent within the past 6 months prior to admission? : Yes Is patient at risk for suicide?: Yes Suicidal Plan?: Yes-Currently Present (overdose) Has patient had any suicidal plan within the past 6 months prior to admission? : Yes Specify Current Suicidal  Plan:  (overdose) Access to Means: Yes Specify Access to Suicidal Means:  (overdose) What has been your use of drugs/alcohol within the last 12 months?: heroin and methamphetamine daily Previous Attempts/Gestures: Yes How many times?: 1 Other Self Harm Risks:  (homeless, unemployed and minimal support) Triggers for Past Attempts: Other (Comment) (drug use and homelessness.) Intentional Self Injurious Behavior: None (overdose) Family Suicide History: No Recent stressful life event(s): Financial Problems, Other (Comment) (homeless) Persecutory voices/beliefs?: No Depression: Yes Depression Symptoms: Despondent, Tearfulness, Isolating, Fatigue, Loss of interest in usual pleasures, Feeling worthless/self pity Substance abuse history and/or treatment for substance abuse?: Yes Suicide prevention information given to non-admitted patients: Not applicable  Risk to Others within the past 6 months Homicidal Ideation: No Does patient have any lifetime risk of violence toward others beyond the six months prior to admission? : No Thoughts of Harm to Others: No Current Homicidal Intent: No Current Homicidal Plan: No Access to Homicidal Means: No Identified Victim:  (none) History of harm to others?: No Assessment of Violence: On  admission Violent Behavior Description:  (none) Does patient have access to weapons?: No Criminal Charges Pending?: No Does patient have a court date: No Is patient on probation?: No  Psychosis Hallucinations: Auditory, Visual Delusions: None noted  Mental Status Report Appearance/Hygiene: Unremarkable Eye Contact: Good Motor Activity: Restlessness Speech: Unremarkable Level of Consciousness: Alert Mood: Depressed, Labile Affect: Flat Anxiety Level: Moderate Thought Processes: Coherent Judgement: Unimpaired Orientation: Person, Place, Time, Situation Obsessive Compulsive Thoughts/Behaviors: None  Cognitive Functioning Concentration: Normal Memory:  Recent Intact, Remote Intact IQ: Average Insight: Fair Impulse Control: Poor Appetite: Poor Weight Loss:  (30-40 lbs in the past few months) Weight Gain:  (none) Sleep: Decreased Total Hours of Sleep: 3 Vegetative Symptoms: None  ADLScreening [facility] Assessment Services) Patient's cognitive ability adequate to safely complete daily activities?: Yes Patient able to express need for assistance with ADLs?: No Independently performs ADLs?: Yes (appropriate for developmental age)  Prior Inpatient Therapy Prior Inpatient Therapy: Yes (parents) Prior Therapy Dates:  (was hospitalized ay [facility] in March) Prior Therapy Facilty/Provider(s):  ([facility] and [facility]) Reason for Treatment:  (depression and SA issues)  Prior Outpatient Therapy Prior Outpatient Therapy: Yes Prior Therapy Dates:  (active client at [facility]) Prior Therapy Facilty/Provider(s):  Museum/gallery curator) Reason for Treatment: depression and SA Issues Does patient have an ACCT team?: No Does patient have Intensive In-House Services?  : No Does patient have [facility] services? : Yes Does patient have P4CC services?: No  ADL Screening (condition at time of admission) Patient's cognitive ability adequate to safely complete daily activities?: Yes Is the patient deaf or have difficulty hearing?: No Does the patient have difficulty seeing, even when wearing glasses/contacts?: No Does the patient have difficulty concentrating, remembering, or making decisions?: No Patient able to express need for assistance with ADLs?: No Does the patient have difficulty dressing or bathing?: No Independently performs ADLs?: Yes (appropriate for developmental age) Does the patient have difficulty walking or climbing stairs?: No Weakness of Arms/Hands: None       Abuse/Neglect Assessment (Assessment to be complete while patient is alone) Physical Abuse: Denies Verbal Abuse: Denies Sexual Abuse: Denies Exploitation of patient/patient's resources:  Denies Self-Neglect: Denies Values / Beliefs Cultural Requests During Hospitalization: None Spiritual Requests During Hospitalization: None Consults Spiritual Care Consult Needed: No Social Work Consult Needed: No Merchant navy officer (For Healthcare) Does Patient Have a Medical Advance Directive?: No    Additional Information 1:1 In Past 12 Months?: No CIRT Risk: No Elopement Risk: No Does patient have medical clearance?: Yes   Staffed case with Leighton Ruff, FNP who felt like patient needed inpatient psych treatment.  Disposition: Psych Inpatient Disposition Initial Assessment Completed for this Encounter: Yes Disposition of Patient: Inpatient treatment program Type of inpatient treatment program: Adult  On Site Evaluation by:   Reviewed with Physician:    Arnoldo Lenis Briunna Leicht 02/12/2017 1:56 PM

## 2017-02-12 NOTE — Progress Notes (Signed)
Patient ID: [name], male   DOB: 1991-04-13, 26 y.o.   MRN: [id number]    26 year old white male admitted after his father brought him to [facility] as a walk-in. Pt reported that he was at rock bottom and that his life was going no where. Pt reported that he was at [facility] 3 years ago for substance abuse and that he continues to have substance abuse problems. Pt reported that 4 days ago he used heroin and meth. Pt was asked about withdrawal, he reported that he was not having any because he last used 4 days ago. Pt reported that prior to [facility] he went to [facility] to get back on his medication Seroquel, Trazodone, and Celexa. Pt reported that medication was not working. Pt reported that he was homeless and that he did not want to live anymore. Pt reported that his depression was a 9, his hopelessness was a 10, and his anxiety was a 10. Pt reported that he positive SI, but could contract for safety. Pt reported that he was negative HI, AH, and VH.

## 2017-02-12 NOTE — H&P (Signed)
Behavioral Health Medical Screening Exam  [name] is an 26 y.o. male who arrived voluntarily to [facility] accompanied by his dad with c/o overwhelming depression and suicidal ideation. Patient expressing having no desire to live. He is voicing suicide ideations with plan to overdose.     Total Time spent with patient: 30 minutes  Psychiatric Specialty Exam: Physical Exam  Constitutional: He is oriented to person, place, and time. He appears well-developed and well-nourished.  HENT:  Head: Normocephalic and atraumatic.  Eyes: Pupils are equal, round, and reactive to light. Conjunctivae are normal.  Neck: Normal range of motion. Neck supple.  Cardiovascular: Normal rate and regular rhythm.   Respiratory: Effort normal and breath sounds normal.  GI: Soft. Bowel sounds are normal.  Genitourinary:  Genitourinary Comments: Deferred  Musculoskeletal: Normal range of motion.  Neurological: He is alert and oriented to person, place, and time.  Skin: Skin is warm and dry.    Review of Systems  Psychiatric/Behavioral: Positive for depression, substance abuse and suicidal ideas. Negative for hallucinations and memory loss. The patient is nervous/anxious. The patient does not have insomnia.     Blood pressure 117/79, pulse (!) 109, temperature 98.4 F (36.9 C), temperature source Oral, resp. rate 18, SpO2 100 %.There is no height or weight on file to calculate BMI.  General Appearance: Disheveled  Eye Contact:  Fair  Speech:  Clear and Coherent and Normal Rate  Volume:  Normal  Mood:  Anxious, Depressed and helpless  Affect:  Appropriate and Congruent  Thought Process:  Coherent and Goal Directed  Orientation:  Full (Time, Place, and Person)  Thought Content:  WDL and Logical  Suicidal Thoughts:  passive ideations  Homicidal Thoughts:  No  Memory:  Immediate;   Good Recent;   Good Remote;   Fair  Judgement:  Good  Insight:  Good  Psychomotor Activity:  Normal  Concentration:  Concentration: Good and Attention Span: Good  Recall:  Good  Fund of Knowledge:Good  Language: Good  Akathisia:  Negative  Handed:  Right  AIMS (if indicated):     Assets:  Communication Skills Desire for Improvement Financial Resources/Insurance Physical Health Resilience Social Support  Sleep:       Musculoskeletal: Strength & Muscle Tone: within normal limits Gait & Station: normal Patient leans: N/A  Blood pressure 117/79, pulse (!) 109, temperature 98.4 F (36.9 C), temperature source Oral, resp. rate 18, SpO2 100 %.  Recommendations:  Based on my evaluation the patient appears to have an emergency condition for which I recommend the patient be admitted inpatient for medication management and stabilization.    Delila Pereyra, NP 02/12/2017, 1:30 PM

## 2017-02-12 NOTE — Tx Team (Signed)
Initial Treatment Plan 02/12/2017 2:24 PM [name] NWG:[id number]    PATIENT STRESSORS: Medication change or noncompliance Substance abuse   PATIENT STRENGTHS: Ability for insight General fund of knowledge   PATIENT IDENTIFIED PROBLEMS: Depression    SI    Substance abuse    "I am at rock bottom"         DISCHARGE CRITERIA:  Improved stabilization in mood, thinking, and/or behavior Need for constant or close observation no longer present  PRELIMINARY DISCHARGE PLAN: Placement in alternative living arrangements  PATIENT/FAMILY INVOLVEMENT: This treatment plan has been presented to and reviewed with the patient, [name], and/or family member.  The patient and family have been given the opportunity to ask questions and make suggestions.  Buford Dresser, RN 02/12/2017, 2:24 PM

## 2017-02-13 DIAGNOSIS — G4700 Insomnia, unspecified: Secondary | ICD-10-CM

## 2017-02-13 DIAGNOSIS — F1290 Cannabis use, unspecified, uncomplicated: Secondary | ICD-10-CM

## 2017-02-13 DIAGNOSIS — F1910 Other psychoactive substance abuse, uncomplicated: Secondary | ICD-10-CM

## 2017-02-13 DIAGNOSIS — I951 Orthostatic hypotension: Secondary | ICD-10-CM

## 2017-02-13 DIAGNOSIS — F39 Unspecified mood [affective] disorder: Secondary | ICD-10-CM

## 2017-02-13 DIAGNOSIS — K5900 Constipation, unspecified: Secondary | ICD-10-CM

## 2017-02-13 DIAGNOSIS — R740 Nonspecific elevation of levels of transaminase and lactic acid dehydrogenase [LDH]: Secondary | ICD-10-CM

## 2017-02-13 DIAGNOSIS — F1490 Cocaine use, unspecified, uncomplicated: Secondary | ICD-10-CM

## 2017-02-13 DIAGNOSIS — F319 Bipolar disorder, unspecified: Principal | ICD-10-CM

## 2017-02-13 DIAGNOSIS — F419 Anxiety disorder, unspecified: Secondary | ICD-10-CM

## 2017-02-13 LAB — URINALYSIS, ROUTINE W REFLEX MICROSCOPIC
Bilirubin Urine: NEGATIVE
GLUCOSE, UA: NEGATIVE mg/dL
HGB URINE DIPSTICK: NEGATIVE
KETONES UR: 5 mg/dL — AB
LEUKOCYTES UA: NEGATIVE
Nitrite: NEGATIVE
PROTEIN: NEGATIVE mg/dL
RBC / HPF: NONE SEEN RBC/hpf (ref 0–5)
SQUAMOUS EPITHELIAL / LPF: NONE SEEN
Specific Gravity, Urine: 1.027 (ref 1.005–1.030)
WBC, UA: NONE SEEN WBC/hpf (ref 0–5)
pH: 6 (ref 5.0–8.0)

## 2017-02-13 LAB — RAPID URINE DRUG SCREEN, HOSP PERFORMED
Amphetamines: POSITIVE — AB
Barbiturates: NOT DETECTED
Benzodiazepines: NOT DETECTED
Cocaine: POSITIVE — AB
Opiates: NOT DETECTED
Tetrahydrocannabinol: POSITIVE — AB

## 2017-02-13 LAB — LIPID PANEL
CHOLESTEROL: 146 mg/dL (ref 0–200)
HDL: 43 mg/dL (ref >40)
LDL CALC: 86 mg/dL (ref 0–99)
TRIGLYCERIDES: 85 mg/dL (ref <150)
Total CHOL/HDL Ratio: 3.4 RATIO
VLDL: 17 mg/dL (ref 0–40)

## 2017-02-13 LAB — HEMOGLOBIN A1C
HEMOGLOBIN A1C: 4.8 % (ref 4.8–5.6)
Mean Plasma Glucose: 91.06 mg/dL

## 2017-02-13 MED ORDER — SERTRALINE HCL 25 MG PO TABS
25.0000 mg | ORAL_TABLET | Freq: Every day | ORAL | Status: DC
  Administered 2017-02-13 – 2017-02-16 (×4): 25 mg via ORAL
  Filled 2017-02-13 (×6): qty 1

## 2017-02-13 MED ORDER — IBUPROFEN 600 MG PO TABS
600.0000 mg | ORAL_TABLET | Freq: Three times a day (TID) | ORAL | Status: DC | PRN
  Administered 2017-02-14 – 2017-02-15 (×2): 600 mg via ORAL
  Filled 2017-02-13 (×2): qty 1

## 2017-02-13 MED ORDER — QUETIAPINE FUMARATE 100 MG PO TABS
100.0000 mg | ORAL_TABLET | Freq: Every day | ORAL | Status: DC
  Administered 2017-02-13 – 2017-02-14 (×2): 100 mg via ORAL
  Filled 2017-02-13 (×3): qty 1

## 2017-02-13 MED ORDER — QUETIAPINE FUMARATE 25 MG PO TABS
25.0000 mg | ORAL_TABLET | Freq: Two times a day (BID) | ORAL | Status: DC
  Administered 2017-02-13 – 2017-02-16 (×6): 25 mg via ORAL
  Filled 2017-02-13 (×9): qty 1

## 2017-02-13 MED ORDER — QUETIAPINE FUMARATE 25 MG PO TABS
25.0000 mg | ORAL_TABLET | Freq: Once | ORAL | Status: DC
  Filled 2017-02-13: qty 1

## 2017-02-13 NOTE — Progress Notes (Signed)
Writer attempted to call patients brother to inform him about the call but did not get an answer. Writer informed Orell and he reported not to call anyone else because it was not that serious. Patient currently lying in bed resting.Safety maintained with 15 min checks.

## 2017-02-13 NOTE — B[facility] Group Notes (Signed)
LCSW Group Therapy Note   02/13/2017 1:15pm   Type of Therapy and Topic:  Group Therapy:  Overcoming Obstacles   Participation Level:  Did Not Attend   Description of Group:    In this group patients will be encouraged to explore what they see as obstacles to their own wellness and recovery. They will be guided to discuss their thoughts, feelings, and behaviors related to these obstacles. The group will process together ways to cope with barriers, with attention given to specific choices patients can make. Each patient will be challenged to identify changes they are motivated to make in order to overcome their obstacles. This group will be process-oriented, with patients participating in exploration of their own experiences as well as giving and receiving support and challenge from other group members.   Therapeutic Goals: 1. Patient will identify personal and current obstacles as they relate to admission. 2. Patient will identify barriers that currently interfere with their wellness or overcoming obstacles.  3. Patient will identify feelings, thought process and behaviors related to these barriers. 4. Patient will identify two changes they are willing to make to overcome these obstacles:      Summary of Patient Progress      Therapeutic Modalities:   Cognitive Behavioral Therapy Solution Focused Therapy Motivational Interviewing Relapse Prevention Therapy  Sallee Lange, LCSW 02/13/2017 4:54 PM

## 2017-02-13 NOTE — Progress Notes (Signed)
Adult Psychoeducational Group Note  Date:  02/13/2017 Time:  2:23 AM  Group Topic/Focus:  Wrap-Up Group:   The focus of this group is to help patients review their daily goal of treatment and discuss progress on daily workbooks.  Participation Level:  Active  Participation Quality:  Appropriate  Affect:  Appropriate  Cognitive:  Appropriate  Insight: Appropriate  Engagement in Group:  Engaged  Modes of Intervention:  Discussion  Additional Comments:  Pt is a new admission, he stated his goal for the day was to reach out and get some help. Pt stated he felt like he accomplished his goal today. Pt rated his day a 5 out of 10. Pt stated he attended all groups held today.  Felipa Furnace 02/13/2017, 2:23 AM

## 2017-02-13 NOTE — Progress Notes (Signed)
Adult Psychoeducational Group Note  Date:  02/13/2017 Time:  9:35 PM  Group Topic/Focus:  Wrap-Up Group:   The focus of this group is to help patients review their daily goal of treatment and discuss progress on daily workbooks.  Participation Level:  Active  Participation Quality:  Appropriate  Affect:  Appropriate  Cognitive:  Appropriate  Insight: Appropriate  Engagement in Group:  Engaged  Modes of Intervention:  Discussion  Additional Comments:  Pt stated his goal for today was to make it through the day without falling. Pt stated he accomplished his goal for today. Pt rated his over all day a 4 out of 10. Pt stated his goal for tomorrow was to attend all groups.  Felipa Furnace 02/13/2017, 9:35 PM

## 2017-02-13 NOTE — H&P (Signed)
TRH H&P    Patient Demographics:    [name], is a 26 y.o. male  MRN: [id number]  DOB - 06/10/1990  Admit Date - 02/12/2017  Referring MD/NP/PA: Dr Jama Flavors  Outpatient Primary MD for the patient is Martha Clan, MD    Chief Complaint  Patient presents with  . Depression      HPI:    [name]  is a 26 y.o. male, History of depression, hepatitis C who is currently admitted at [facility] for detox. Patient's orthostatic vital signs showed significant drop of blood pressure on standing, 69/39. And also has elevated liver enzymes. [facility] hospitalist consulted for further recommendations. Patient is asymptomatic, denies blurred vision, no syncopal episodes no dizziness on standing. He complains of heaviness in fore head on standing. Patient says that he has not been eating and drinking well for past few days. He denies chest pain, no shortness of breath. No nausea vomiting or diarrhea. Patient has been eating more since he came to hospital. Patient's liver enzymes are elevated AST 163, ALT 382.  His previous liver enzymes have been within normal limits. He does have history of hepatitis C.    Review of systems:    In addition to the HPI above,   All other systems reviewed and are negative.   With Past History of the following :    Past Medical History:  Diagnosis Date  . Anxiety   . Attention deficit disorder   . Depression   . Diverticulitis   . Hiccups    pt states hiccups during sleep       Past Surgical History:  Procedure Laterality Date  . colonscopy      removed polyps   . LAPAROSCOPIC PARTIAL COLECTOMY N/A 07/01/2014   Procedure: LAPAROSCOPIC ASSISTED SIGMOID COLECTOMY WITH MOBILIZATION OF SPLENIC FLEXURE;  Surgeon: Avel Peace, MD;  Location: [facility]S;  Service: General;  Laterality: N/A;      Social History:      Social History  Substance Use Topics  . Smoking status:  Never Smoker  . Smokeless tobacco: Current User    Types: Chew  . Alcohol use Yes     Comment: occasionally       Family History :    No significant family history   Home Medications:   Prior to Admission medications   Medication Sig Start Date End Date Taking? Authorizing Provider  citalopram (CELEXA) 20 MG tablet Take 20 mg by mouth daily.   Yes [provider]  QUEtiapine (SEROQUEL) 300 MG tablet Take 1 tablet (300 mg total) by mouth at bedtime. 09/20/16  Yes Withrow, Everardo All, FNP  traZODone (DESYREL) 100 MG tablet Take 1 tablet (100 mg total) by mouth at bedtime. 09/20/16  Yes Withrow, Everardo All, FNP  sertraline (ZOLOFT) 50 MG tablet Take 1 tablet (50 mg total) by mouth daily. Patient not taking: Reported on 02/13/2017 09/21/16   Beau Fanny, FNP     Allergies:     Allergies  Allergen Reactions  . Cephalexin Anaphylaxis  Physical Exam:   Vitals  Blood pressure 122/62, pulse 100, temperature 98.5 F (36.9 C), temperature source Oral, resp. rate 18, height  (1.854 m), weight 99.8 kg (220 lb), SpO2 100 %.  1.  General: Appears in no acute distress  2. Psychiatric:  Intact judgement and  insight, awake alert, oriented x 3.  3. Neurologic: No focal neurological deficits, all cranial nerves intact.Strength 5/5 all 4 extremities, sensation intact all 4 extremities, plantars down going.  4. Eyes :  anicteric sclerae, moist conjunctivae with no lid lag. PERRLA.  5. ENMT:  Oropharynx clear with moist mucous membranes and good dentition  6. Neck:  supple, no cervical lymphadenopathy appriciated, No thyromegaly  7. Respiratory : Normal respiratory effort, good air movement bilaterally,clear to  auscultation bilaterally  8. Cardiovascular : RRR, no gallops, rubs or murmurs, no leg edema  9. Gastrointestinal:  Positive bowel sounds, abdomen soft, non-tender to palpation,no hepatosplenomegaly, no rigidity or guarding       10. Skin:  No cyanosis,  normal texture and turgor, no rash, lesions or ulcers  11.Musculoskeletal:  Good muscle tone,  joints appear normal , no effusions,  normal range of motion    Data Review:    CBC  Recent Labs Lab 02/12/17 1830  WBC 9.8  HGB 15.3  HCT 44.0  PLT 259  MCV 84.0  MCH 29.2  MCHC 34.8  RDW 13.9   ------------------------------------------------------------------------------------------------------------------  Chemistries   Recent Labs Lab 02/12/17 1830  NA 135  K 3.8  CL 99*  CO2 27  GLUCOSE 141*  BUN 16  CREATININE 1.00  CALCIUM 9.8  AST 163*  ALT 382*  ALKPHOS 110  BILITOT 0.9   ------------------------------------------------------------------------------------------------------------------  ------------------------------------------------------------------------------------------------------------------ GFR: Estimated Creatinine Clearance: 138 mL/min (by C-G formula based on SCr of 1 mg/dL). Liver Function Tests:  Recent Labs Lab 02/12/17 1830  AST 163*  ALT 382*  ALKPHOS 110  BILITOT 0.9  PROT 8.2*  ALBUMIN 4.3   No results for input(s): LIPASE, AMYLASE in the last 168 hours. No results for input(s): AMMONIA in the last 168 hours. Coagulation Profile: No results for input(s): INR, PROTIME in the last 168 hours. Cardiac Enzymes: No results for input(s): CKTOTAL, CKMB, CKMBINDEX, TROPONINI in the last 168 hours. BNP (last 3 results) No results for input(s): PROBNP in the last 8760 hours. HbA1C:  Recent Labs  02/12/17 1830  HGBA1C 4.8   CBG: No results for input(s): GLUCAP in the last 168 hours. Lipid Profile: No results for input(s): CHOL, HDL, LDLCALC, TRIG, CHOLHDL, LDLDIRECT in the last 72 hours. Thyroid Function Tests: No results for input(s): TSH, T4TOTAL, FREET4, T3FREE, THYROIDAB in the last 72 hours. Anemia Panel: No results for input(s): VITAMINB12, FOLATE, FERRITIN, TIBC, IRON, RETICCTPCT in the last 72  hours.  --------------------------------------------------------------------------------------------------------------- Urine analysis:    Component Value Date/Time   COLORURINE YELLOW 02/12/2017 2150   APPEARANCEUR TURBID (A) 02/12/2017 2150   LABSPEC 1.027 02/12/2017 2150   PHURINE 6.0 02/12/2017 2150   GLUCOSEU NEGATIVE 02/12/2017 2150   HGBUR NEGATIVE 02/12/2017 2150   BILIRUBINUR NEGATIVE 02/12/2017 2150   KETONESUR 5 (A) 02/12/2017 2150   PROTEINUR NEGATIVE 02/12/2017 2150   UROBILINOGEN 0.2 12/06/2013 1227   NITRITE NEGATIVE 02/12/2017 2150   LEUKOCYTESUR NEGATIVE 02/12/2017 2150      Imaging Results:    No results found.  My personal review of EKG: Rhythm NSR   Assessment & Plan:    Principal Problem:   Bipolar 1 disorder (HCC)  Orthostatic hypotension- TED hose was recommended but will not be able to use TED hose at [facility]. Patient's blood pressure has improved since he has been drinking more Gatorade since this afternoon. Will continue with oral hydration therapy, he is not symptomatic. Continue to check orthostatic vital signs every 8 hours.  Transaminitis- patient has elevated AST and ALT, will check abdominal ultrasound in a.m. This could also be due to hypotension. Recheck liver enzymes in a.m.     Mauro Kaufmann S M.D on 02/13/2017 at 7:10 PM  Between 7am to 7pm - Pager - [phone]. After 7pm go to [email or web address] - password [facility]  [facility] - Office  [phone]

## 2017-02-13 NOTE — Progress Notes (Signed)
Patient ID: [name], male   DOB: 1990/06/10, 26 y.o.   MRN: [id number]  Patient is scheduled, per order, to be at [facility] 0800 for US Abdomen Complete. Patient is to be NPO for at least 6 hours before ultrasound. Patient was made aware and has verbalized understanding.  EKG performed and results shown to MD [place] prior to placing on patient's chart.

## 2017-02-13 NOTE — Progress Notes (Signed)
Recreation Therapy Notes  Date: 02/13/17 Time: 0930 Location: 300 Hall Dayroom  Group Topic: Stress Management  Goal Area(s) Addresses:  Patient will verbalize importance of using healthy stress management.  Patient will identify positive emotions associated with healthy stress management.   Intervention: Stress Management  Activity :  LRT introduced the stress management technique of guided imagery.  LRT read a script that allowed patients to take a mental vacation to the beach.  Patients were to follow along as LRT read script to fully engage in the technique.  Education:  Stress Management, Discharge Planning.   Education Outcome: Acknowledges edcuation/In group clarification offered/Needs additional education  Clinical Observations/Feedback: Pt did not attend group.   Caroll Rancher, LRT/CTRS         Caroll Rancher A 02/13/2017 1:36 PM

## 2017-02-13 NOTE — Progress Notes (Signed)
Writer spoke with patient after visitation and he c/o aching all over and feeling nervous. Writer informed him of medications available and he requested tylenol and received vistaril for his anxiety. He inquired about his Seroquel and Clinical research associate informed him that on tomorrow his doctor would discuss his medications with him. He reported that his mother visited him tonight. Writer will monitor effectiveness of medications given. He plans to attend group tonight. Support given and safety maintained on unit with 15 min checks.

## 2017-02-13 NOTE — Progress Notes (Signed)
Patient ID: [name], male   DOB: 01-18-1991, 26 y.o.   MRN: [id number]  DAR: Pt. Denies SI/HI and A/V Hallucinations. He reports sleep is poor, appetite is poor, depression is increasing, and restlessness is increasing. He reports his anxiety is high and does appear visibly anxious as well. Patient reports feeling as though he's crawling out of his skin and appears restless. Support and encouragement provided to the patient. Scheduled one time dose of Seroquel was not given due to patient's orthostatic vital signs earlier. Patient is seen in the milieu intermittently but often retreats back to his room. Internal MD was informed of patient's status this evening and scheduling of US Abdomen for the morning. Patient is continued to be encouraged to push fluids and eat if possible. He reports that he is drinking plenty of fluids, this shows in his evening orthostatic vital signs. Elevated pulse was reported to MD [place]. Patient had a visitor this evening. Q15 minute checks are maintained for safety.

## 2017-02-13 NOTE — [facility] Counselor (Signed)
Adult Comprehensive Assessment  Patient ID: Zale Marcotte, male   DOB: Dec 02, 1990, 26 y.o.   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 Family Relationships: "I have disappointed my parents, they are supportive of me when I let them but I have let them down over and over" Financial / Lack of resources (include bankruptcy): works Housing / Lack of housing: lived w aunt, does not want to return "its a bad environment for me" Physical health (include injuries & life threatening diseases): worried about elevated liver enzymes, has Hep C Social relationships: "I have friends when I let them in" Substance abuse: heroin, methamphetamine, cocaine Bereavement / Loss: "it all started when my grandfather, my best friend, died when I was 44, I used that day"  Living/Environment/Situation:  Living Arrangements: Other relatives Living conditions (as described by patient or guardian): has lived w aunt for past 4 weeks How long has patient lived in current situation?: prior to living w aunt, [facility]  What is atmosphere in current home: Temporary  Family History:  Marital status: Single Are you sexually active?: Yes What is your sexual orientation?: heterosexual Has your sexual activity been affected by drugs, alcohol, medication, or emotional stress?: no Does patient have children?: Yes  Childhood History:  By whom was/is the patient raised?: Both parents Additional childhood history information: Pt reports having a good childhood with no significant issues.  Description of patient's relationship with caregiver when they were a child: Pt reports getting along well with parents growing up.   Patient's description of current relationship with people who raised him/her: "I dont want to talk w them  because I am a disappointment to them" How were you disciplined when you got in trouble as a child/adolescent?: unionwn Does patient have siblings?: Yes Number of Siblings: 1 Description of patient's current relationship with siblings: pt reports being close to brother who lives in [place] Did patient suffer any verbal/emotional/physical/sexual abuse as a child?: No Did patient suffer from severe childhood neglect?: No Has patient ever been sexually abused/assaulted/raped as an adolescent or adult?: No Was the patient ever a victim of a crime or a disaster?: No Witnessed domestic violence?: No Has patient been effected by domestic violence as an adult?: No  Education:  Highest grade of school patient has completed: high Garment/textile technologist, one year of community college Currently a student?: No Name of school: [employer] Contact person: N/A Learning disability?: No  Employment/Work Situation:   Employment situation: Employed 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 How long has patient been employed?: several months Patient's job has been impacted by current illness: Yes Describe how patient's job has been impacted: has had diffiiculty maintaining employment due to ongoing substance abuse 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 Where was the patient employed at that time?: 2 years Has patient ever been in the [employer]?: No Has patient ever served in combat?: No Did You Receive Any Psychiatric Treatment/Services While in Equities trader?: No Are There Guns or Other Weapons in Your Home?: No  Financial Resources:   Financial resources: Income from employment Does patient have a representative payee or guardian?: No  Alcohol/Substance Abuse:   What has been your use of drugs/alcohol within the last 12 months?: "1 gram/heroin/day for past 4 days", methamphetamine once/week, occasional cocaine; has used  heroin since age 57 "I I stay clean for a little while,  then I mess it up w an impulsive decision If attempted suicide, did drugs/alcohol play a role in this?: No Alcohol/Substance Abuse Treatment Hx: Past Tx, Outpatient, Past detox If yes, describe treatment: has attended [facility], lived in recovery housing, 8 months of treatment at [facility] in [place] Has alcohol/substance abuse ever caused legal problems?: No  Social Support System:   Forensic psychologist System: Fair Museum/gallery exhibitions officer System: "I have friends when I reach out to them" Type of faith/religion: Ephriam Knuckles How does patient's faith help to cope with current illness?: "I have been taking it for granted", attended [employer] [place] [employer] school, faith is important to him  Leisure/Recreation:   Leisure and Hobbies: "working"  Strengths/Needs:   What things does the patient do well?: hard worker, when able to focus on work and not use substances In what areas does patient struggle / problems for patient: "I relapse when I get around someone who has drugs, I make dumb decisions"  Discharge Plan:   Does patient have access to transportation?: Yes Will patient be returning to same living situation after discharge?: No Plan for living situation after discharge: wants long term rehab "a year or more" Currently receiving community mental health services: Yes (From Whom) [facility] [place] for mental health meds) If no, would patient like referral for services when discharged?: Yes (What county?) (wants referral to long term rehab) Does patient have financial barriers related to discharge medications?: Yes Patient description of barriers related to discharge medications: will be referred to providers who can assist  Summary/Recommendations:   Summary and Recommendations (to be completed by the evaluator): PAtient is a 26 year old male, admitted volutarnily and diagnosed w Bipolar 1 Disorder.  Current use of heroin, methamphetamine and cocaine.  Wants long term residential treatment,  has had placements in recovery housing and has lost these due to relapse.  REceives outpatient mental health care at [facility] in [place].  Goals for hospitalization include referral for continued substance abuse treatment and "get my medications straight."    Sallee Lange. 02/13/2017

## 2017-02-13 NOTE — Progress Notes (Signed)
Nursing Progress Note [id number]  D) Patient presents with flat affect and depressed mood but is cooperative with writer this evening. PO fluids pushed, patient drinking Gatorade. Patient has been educated he is NPO after midnight; patient verbalizes understanding. MHT informed. Patient vitals monitored per Post-Fall Bundle and orthostatic blood pressure/heart rate monitored q shift. Patient reports he is anxious and compliant with scheduled Seroquel. Patient denies SI/HI/AVH or pain. Patient contracts for safety on the unit. Patient educated about calling staff for assistance during the night; patient verbalizes understanding.  A) Emotional support given. 1:1 interaction and active listening provided. Patient medicated as prescribed. Medications and plan of care reviewed with patient. Patient verbalized understanding without further questions. Snacks and fluids provided. Opportunities for questions or concerns presented to patient. Patient encouraged to continue to work on treatment goals. Labs, vital signs and patient behavior monitored throughout shift. Patient safety maintained with q15 min safety checks. High fall risk precautions in place and reviewed with patient; patient verbalized understanding.  R) Patient receptive to interaction with nurse. Patient remains safe on the unit at this time. Patient denies any adverse medication reactions at this time. Patient is resting in bed without complaints. Will continue to monitor.

## 2017-02-13 NOTE — H&P (Signed)
Psychiatric Admission Assessment Adult  Patient Identification: [name] MRN:  [id number] Date of Evaluation:  02/13/2017 Chief Complaint:  Bipolar I disorder,mre  Principal Diagnosis: Bipolar 1 disorder ([place]) Diagnosis:   Patient Active Problem List   Diagnosis Date Noted  . Bipolar 1 disorder ([place]) [F31.9] 02/12/2017  . Polysubstance abuse ([place]) [F19.10]   . Recurrent major depression-severe ([place]) [F33.2] 08/07/2016  . Substance induced mood disorder ([place]) [F19.94]   . Atypical depression [F32.89]   . Polysubstance (including opioids) dependence with physiol dependence ([place]) [F19.20] 08/23/2014  . Sigmoid diverticulitis s/p lap assisted sigmoid colectomy 07/01/14 [K57.32] 07/01/2014  . Diverticulitis [K57.92] 12/06/2013  . Diverticulitis of large intestine without perforation or abscess without bleeding [K57.32] 12/06/2013  . Obesity, unspecified [E66.9] 12/06/2013  . Acute diverticulitis [K57.92] 12/06/2013  . NAUSEA AND VOMITING [R11.2] 02/09/2009  . OTHER SYMPTOMS INVOLVING DIGESTIVE SYSTEM OTHER [R19.8] 02/09/2009   History of Present Illness:  26 y.o. male  who presented at [facility] on his own requesting assistance with his depression and SA issues.  He states that he was just here a couple weeks ago due to an intentional overdose in an attempt to kill himself and states that he was dischagred to follow-up at [facility] which he states that he did.  He reports that he was restarted on medications, but states that his mood continues to worsen and he states that he is depressed, feels hopeless and helpless.  Patient states that he he is having thoughts of overdosing again. He states that he has been trying to help himself by trying to stay clean and states that he has not used heroin in three days and methamphetamine in the past five days.  Patient states that he is currently homeless and recently lost his job which has made things worse for him. Patient is alert an oriented, he is somewhat  anxious and has a depressed mood.  He states that he is not eating and has weight loss of 30-40 lobs in the past few months.  He states that he is only sleeping 2-3 hours per night.  He states that he has been hearing a voice inside his head telling him to self-sabotage and states that he has been seeing shadows. Patient states that he has been using heroin daily since the age of 68  and states that he has been using meth-ampheatamines daily for the past two years.  He states that he completed eight months of treatment in a long term program and states that he was able to stay clean for four months after completing the program, but subsequently relapsed.  He states that he would like to get his moods stabilized and be referred to a long term program because he states that if he does not get the help he needs that he is going to die.  Patient's chart and findings discussed and reviewed with treatment team. Patient is seen today and details above information. He appears anxious and fidgety during interview. Patient denies any opiate withdrawal symptoms and last use was 6 days ago. Patient wishes to restart medications from last admission and reports that his psychiatrist stopped his Zoloft and put him back on Celexa and he liked the Zoloft better. Discussed with Dr. Parke Poisson and he will be ordering the Seroquel and Zoloft again. Patient is requesting a long term rehab facility and states he was looking at one taht was 2 years and states he would be gone that long, "This is my last chance."  Associated Signs/Symptoms:  Depression Symptoms:  depressed mood, feelings of worthlessness/guilt, hopelessness, suicidal thoughts with specific plan, anxiety, disturbed sleep, weight loss, (Hypo) Manic Symptoms:  Flight of Ideas, Irritable Mood, Anxiety Symptoms:  Excessive Worry, Panic Symptoms, Social Anxiety, Psychotic Symptoms:  Denies PTSD Symptoms: NA Total Time spent with patient: 45 minutes  Past  Psychiatric History: Opiate abuse, Amphetamine abuse, Cocaine abuse, Bipolar, MDD  Is the patient at risk to self? Yes.    Has the patient been a risk to self in the past 6 months? Yes.    Has the patient been a risk to self within the distant past? No.  Is the patient a risk to others? No.  Has the patient been a risk to others in the past 6 months? No.  Has the patient been a risk to others within the distant past? No.   Prior Inpatient Therapy: Prior Inpatient Therapy: Yes (parents) Prior Therapy Dates:  (was hospitalized ay [facility] in March) Prior Therapy Facilty/Provider(s):  [facility] and [facility]) Reason for Treatment:  (depression and SA issues) Prior Outpatient Therapy: Prior Outpatient Therapy: Yes Prior Therapy Dates:  (active client at [facility]) Prior Therapy Facilty/Provider(s):  Consulting civil engineer) Reason for Treatment: depression and SA Issues Does patient have an ACCT team?: No Does patient have Intensive In-House Services?  : No Does patient have [facility] services? : Yes Does patient have P4CC services?: No  Alcohol Screening: 1. How often do you have a drink containing alcohol?: Never 9. Have you or someone else been injured as a result of your drinking?: No 10. Has a relative or friend or a doctor or another health worker been concerned about your drinking or suggested you cut down?: No Alcohol Use Disorder Identification Test Final Score (AUDIT): 0 Brief Intervention: AUDIT score less than 7 or less-screening does not suggest unhealthy drinking-brief intervention not indicated Substance Abuse History in the last 12 months:  Yes.   Consequences of Substance Abuse: Medical Consequences:  reviewed Family Consequences:  reviewed Previous Psychotropic Medications: Yes - Seroquel, Zoloft, Celexa, Prazosin,  Psychological Evaluations: Yes  Past Medical History:  Past Medical History:  Diagnosis Date  . Anxiety   . Attention deficit disorder   . Depression   . Diverticulitis    . Hiccups    pt states hiccups during sleep     Past Surgical History:  Procedure Laterality Date  . colonscopy      removed polyps   . LAPAROSCOPIC PARTIAL COLECTOMY N/A 07/01/2014   Procedure: LAPAROSCOPIC ASSISTED SIGMOID COLECTOMY WITH MOBILIZATION OF SPLENIC FLEXURE;  Surgeon: Jackolyn Confer, MD;  Location: [facility];  Service: General;  Laterality: N/A;   Family History: History reviewed. No pertinent family history. Family Psychiatric  History: Unknown Tobacco Screening: Have you used any form of tobacco in the last 30 days? (Cigarettes, Smokeless Tobacco, Cigars, and/or Pipes): Yes Tobacco use, Select all that apply: 5 or more cigarettes per day Are you interested in Tobacco Cessation Medications?: No, patient refused Counseled patient on smoking cessation including recognizing danger situations, developing coping skills and basic information about quitting provided: Refused/Declined practical counseling Social History:  History  Alcohol Use  . Yes    Comment: occasionally     History  Drug Use  . Types: "Crack" cocaine, Heroin, Methamphetamines, Marijuana, Cocaine    Comment: clean since 11/30/15    Additional Social History: Marital status: Single    Pain Medications: none Prescriptions: none Over the Counter: none History of alcohol / drug use?: Yes Longest period  of sobriety (when/how long): pt states that he has minimal clean time Negative Consequences of Use: Personal relationships, Financial Withdrawal Symptoms:  (none reported) Name of Substance 1: heroin 1 - Age of First Use: 16 1 - Amount (size/oz): gram 1 - Frequency: daily 1 - Duration: 10 years 1 - Last Use / Amount: last use was 3 days ago Name of Substance 2: methamphetamine 2 - Age of First Use: 24 2 - Amount (size/oz): 1/2 gram 2 - Frequency: daily 2 - Duration: 2 years 2 - Last Use / Amount: 5 days ago                Allergies:   Allergies  Allergen Reactions  . Cephalexin  Anaphylaxis   Lab Results:  Results for orders placed or performed during the hospital encounter of 02/12/17 (from the past 48 hour(s))  CBC     Status: None   Collection Time: 02/12/17  6:30 PM  Result Value Ref Range   WBC 9.8 4.0 - 10.5 K/uL   RBC 5.24 4.22 - 5.81 MIL/uL   Hemoglobin 15.3 13.0 - 17.0 g/dL   HCT 44.0 39.0 - 52.0 %   MCV 84.0 78.0 - 100.0 fL   MCH 29.2 26.0 - 34.0 pg   MCHC 34.8 30.0 - 36.0 g/dL   RDW 13.9 11.5 - 15.5 %   Platelets 259 150 - 400 K/uL    Comment: Performed at [facility], [place] [address] [place], [place] [address]  Comprehensive metabolic panel     Status: Abnormal   Collection Time: 02/12/17  6:30 PM  Result Value Ref Range   Sodium 135 135 - 145 mmol/L   Potassium 3.8 3.5 - 5.1 mmol/L   Chloride 99 (L) 101 - 111 mmol/L   CO2 27 22 - 32 mmol/L   Glucose, Bld 141 (H) 65 - 99 mg/dL   BUN 16 6 - 20 mg/dL   Creatinine, Ser 1.00 0.61 - 1.24 mg/dL   Calcium 9.8 8.9 - 10.3 mg/dL   Total Protein 8.2 (H) 6.5 - 8.1 g/dL   Albumin 4.3 3.5 - 5.0 g/dL   AST 163 (H) 15 - 41 U/L   ALT 382 (H) 17 - 63 U/L   Alkaline Phosphatase 110 38 - 126 U/L   Total Bilirubin 0.9 0.3 - 1.2 mg/dL   GFR calc non Af Amer >60 >60 mL/min   GFR calc Af Amer >60 >60 mL/min    Comment: (NOTE) The eGFR has been calculated using the CKD EPI equation. This calculation has not been validated in all clinical situations. eGFR's persistently <60 mL/min signify possible Chronic Kidney Disease.    Anion gap 9 5 - 15    Comment: Performed at [facility], [place] [address], [place],  [address]  Hemoglobin A1c     Status: None   Collection Time: 02/12/17  6:30 PM  Result Value Ref Range   Hgb A1c MFr Bld 4.8 4.8 - 5.6 %    Comment: (NOTE) Pre diabetes:          5.7%-6.4% Diabetes:              >6.4% Glycemic control for   <7.0% adults with diabetes    Mean Plasma Glucose 91.06 mg/dL    Comment: Performed at [place] [address] [place],  [address]  Urinalysis, Routine w reflex microscopic     Status: Abnormal   Collection Time: 02/12/17  9:50  PM  Result Value Ref Range   Color, Urine YELLOW YELLOW   APPearance TURBID (A) CLEAR   Specific Gravity, Urine 1.027 1.005 - 1.030   pH 6.0 5.0 - 8.0   Glucose, UA NEGATIVE NEGATIVE mg/dL   Hgb urine dipstick NEGATIVE NEGATIVE   Bilirubin Urine NEGATIVE NEGATIVE   Ketones, ur 5 (A) NEGATIVE mg/dL   Protein, ur NEGATIVE NEGATIVE mg/dL   Nitrite NEGATIVE NEGATIVE   Leukocytes, UA NEGATIVE NEGATIVE   RBC / HPF NONE SEEN 0 - 5 RBC/hpf   WBC, UA NONE SEEN 0 - 5 WBC/hpf   Bacteria, UA FEW (A) NONE SEEN   Squamous Epithelial / LPF NONE SEEN NONE SEEN   WBC Clumps PRESENT    Mucus PRESENT    Ca Oxalate Crys, UA PRESENT     Comment: Performed at [facility], [place] [address], [place], [place] [address]  Urine rapid drug screen (hosp performed)not at [facility]     Status: Abnormal   Collection Time: 02/12/17  9:50 PM  Result Value Ref Range   Opiates NONE DETECTED NONE DETECTED   Cocaine POSITIVE (A) NONE DETECTED   Benzodiazepines NONE DETECTED NONE DETECTED   Amphetamines POSITIVE (A) NONE DETECTED   Tetrahydrocannabinol POSITIVE (A) NONE DETECTED   Barbiturates NONE DETECTED NONE DETECTED    Comment:        DRUG SCREEN FOR MEDICAL PURPOSES ONLY.  IF CONFIRMATION IS NEEDED FOR ANY PURPOSE, NOTIFY LAB WITHIN 5 DAYS.        LOWEST DETECTABLE LIMITS FOR URINE DRUG SCREEN Drug Class       Cutoff (ng/mL) Amphetamine      1000 Barbiturate      200 Benzodiazepine   562 Tricyclics       130 Opiates          300 Cocaine          300 THC              50 Performed at [facility], [place] [address] [place],  [address]     Blood Alcohol level:  Lab Results  Component Value Date   [facility] <5 01/14/2017   ETH <5 46/96/2952    Metabolic Disorder Labs:  Lab Results  Component Value Date   HGBA1C 4.8 02/12/2017    MPG 91.06 02/12/2017   MPG 97 08/08/2016   No results found for: PROLACTIN Lab Results  Component Value Date   CHOL 178 08/08/2016   TRIG 118 08/08/2016   HDL 52 08/08/2016   CHOLHDL 3.4 08/08/2016   VLDL 24 08/08/2016   LDLCALC 102 (H) 08/08/2016   LDLCALC 60 12/01/2015    Current Medications: Current Facility-Administered Medications  Medication Dose Route Frequency Provider Last Rate Last Dose  . alum & mag hydroxide-simeth (MAALOX/MYLANTA) 200-200-20 MG/5ML suspension 30 mL  30 mL Oral Q4H PRN Okonkwo, Justina A, NP      . hydrOXYzine (ATARAX/VISTARIL) tablet 25 mg  25 mg Oral TID PRN Lu Duffel, Justina A, NP   25 mg at 02/13/17 0249  . ibuprofen (ADVIL,MOTRIN) tablet 600 mg  600 mg Oral Q8H PRN Cobos, Fernando A, MD      . Influenza vac split quadrivalent PF (FLUARIX) injection 0.5 mL  0.5 mL Intramuscular Tomorrow-1000 Lewis, Tanika N, NP      . magnesium hydroxide (MILK OF MAGNESIA) suspension 30 mL  30 mL Oral Daily PRN Okonkwo, Justina A, NP      . nicotine (NICODERM CQ -  dosed in mg/24 hours) patch 21 mg  21 mg Transdermal Daily Derrill Center, NP   21 mg at 02/13/17 0820  . QUEtiapine (SEROQUEL) tablet 100 mg  100 mg Oral QHS Cobos, Fernando A, MD      . QUEtiapine (SEROQUEL) tablet 25 mg  25 mg Oral BID Cobos, Myer Peer, MD      . QUEtiapine (SEROQUEL) tablet 25 mg  25 mg Oral Once Cobos, Fernando A, MD      . sertraline (ZOLOFT) tablet 25 mg  25 mg Oral Daily Cobos, Myer Peer, MD   25 mg at 02/13/17 1118  . traZODone (DESYREL) tablet 50 mg  50 mg Oral QHS PRN Okonkwo, Justina A, NP   50 mg at 02/12/17 2225   PTA Medications: Prescriptions Prior to Admission  Medication Sig Dispense Refill Last Dose  . citalopram (CELEXA) 20 MG tablet Take 20 mg by mouth daily.   Past Month at Unknown time  . QUEtiapine (SEROQUEL) 300 MG tablet Take 1 tablet (300 mg total) by mouth at bedtime. 30 tablet 0 Past Week at Unknown time  . traZODone (DESYREL) 100 MG tablet Take 1 tablet (100  mg total) by mouth at bedtime. 30 tablet 0 Past Week at Unknown time  . sertraline (ZOLOFT) 50 MG tablet Take 1 tablet (50 mg total) by mouth daily. (Patient not taking: Reported on 02/13/2017) 30 tablet 0 Not Taking at Unknown time    Musculoskeletal: Strength & Muscle Tone: within normal limits Gait & Station: normal Patient leans: N/A  Psychiatric Specialty Exam: Physical Exam  Nursing note and vitals reviewed. Constitutional: He is oriented to person, place, and time. He appears well-developed and well-nourished.  Cardiovascular:  Tachycardic  Respiratory: Effort normal.  Musculoskeletal: Normal range of motion.  Neurological: He is alert and oriented to person, place, and time.  Skin: Skin is warm.    Review of Systems  Constitutional: Positive for malaise/fatigue.  HENT: Negative.   Eyes: Negative.   Respiratory: Negative.   Cardiovascular: Negative.   Gastrointestinal: Negative.   Genitourinary: Negative.   Musculoskeletal: Negative.   Skin: Negative.   Neurological: Negative.   Endo/Heme/Allergies: Negative.     Blood pressure 122/62, pulse (!) 138, temperature 98.5 F (36.9 C), temperature source Oral, resp. rate 18, height '6\' 1"'  (1.854 m), weight 99.8 kg (220 lb), SpO2 100 %.Body mass index is 29.03 kg/m.  General Appearance: Disheveled  Eye Contact:  Fair  Speech:  Clear and Coherent and Normal Rate  Volume:  Normal  Mood:  Anxious and Depressed  Affect:  Depressed and Flat  Thought Process:  Goal Directed and Descriptions of Associations: Intact  Orientation:  Full (Time, Place, and Person)  Thought Content:  WDL  Suicidal Thoughts:  No  Homicidal Thoughts:  No  Memory:  Immediate;   Fair Recent;   Fair Remote;   Fair  Judgement:  Fair  Insight:  Fair  Psychomotor Activity:  Normal  Concentration:  Concentration: Fair and Attention Span: Fair  Recall:  [employer] of Knowledge:  Fair  Language:  Fair  Akathisia:  No  Handed:  Right  AIMS (if  indicated):     Assets:  Desire for Improvement Financial Resources/Insurance Housing Social Support Transportation  ADL's:  Intact  Cognition:  WNL  Sleep:  Number of Hours: 4.25    Treatment Plan Summary: Daily contact with patient to assess and evaluate symptoms and progress in treatment, Medication management and Plan is to:  -Medication management  see MAR and SRA -Encourage group therapy participation -CSW to seek long term rehab facility 2 months or more is patient's interest  Observation Level/Precautions:  15 minute checks  Laboratory:  Reviewed  Psychotherapy:  Group therapy  Medications:  See MAR  Consultations:  As needed  Discharge Concerns:  Relapse  Estimated LOS: 3-5 Days  Other:  Admit to [facility] for Primary Diagnosis: Bipolar 1 disorder ([place]) Long Term Goal(s): Improvement in symptoms so as ready for discharge  Short Term Goals: Ability to verbalize feelings will improve, Ability to disclose and discuss suicidal ideas and Ability to demonstrate self-control will improve  Physician Treatment Plan for Secondary Diagnosis: Principal Problem:   Bipolar 1 disorder ([place])  Long Term Goal(s): Improvement in symptoms so as ready for discharge  Short Term Goals: Ability to maintain clinical measurements within normal limits will improve and Compliance with prescribed medications will improve  I certify that inpatient services furnished can reasonably be expected to improve the patient's condition.    Lewis Shock, FNP 10/1/20181:23 PM

## 2017-02-13 NOTE — Progress Notes (Addendum)
   02/13/17 0330  What Happened  Was fall witnessed? No  Was patient injured? No  Patient found on floor  Found by No one-pt stated they fell  Stated prior activity bathroom-unassisted  Follow Up  MD notified yes Nira Conn NP)  Time MD notified 0300  Family notified Yes-comment (brother will be called at 6 am Mylinda Latina))  Time family notified 0600  Additional tests No  Simple treatment Other (comment) (NP Nira Conn assessed patient's buttock, talbone area)  Progress note created (see row info) Yes  Adult Fall Risk Assessment  Risk Factor Category (scoring not indicated) Fall has occurred during this admission (document High fall risk);High fall risk per protocol (document High fall risk)  Patient's Fall Risk High Fall Risk (>13 points)  Adult Fall Risk Interventions  Required Bundle Interventions *See Row Information* High fall risk - low, moderate, and high requirements implemented

## 2017-02-13 NOTE — Plan of Care (Signed)
Problem: Health Behavior/Discharge Planning: Goal: Compliance with treatment plan for underlying cause of condition will improve Outcome: Progressing Patient taking medications and attending groups per plan of care. Patient verbalizes understanding and is agreeable to current plan of care.  Problem: Safety: Goal: Periods of time without injury will increase Outcome: Progressing Patient is on q15 minute safety checks and high fall risk precautions. Patient contracts for safety on the unit and remains safe at this time. Orthostatic blood pressured monitored; PO fluids pushed.

## 2017-02-13 NOTE — [facility] Suicide Risk Assessment (Signed)
[facility] INPATIENT:  Family/Significant Other Suicide Prevention Education  Suicide Prevention Education:  Patient Refusal for Family/Significant Other Suicide Prevention Education: The patient [name] has refused to provide written consent for family/significant other to be provided Family/Significant Other Suicide Prevention Education during admission and/or prior to discharge.  Physician notified.  Sallee Lange 02/13/2017, 4:21 PM

## 2017-02-13 NOTE — Tx Team (Signed)
Interdisciplinary Treatment and Diagnostic Plan Update  02/13/2017 Time of Session: 9:30 AM Kenny Rea MRN: [id number]  Principal Diagnosis: Bipolar 1 disorder ([place])  Secondary Diagnoses: Principal Problem:   Bipolar 1 disorder ([place])   Current Medications:  Current Facility-Administered Medications  Medication Dose Route Frequency Provider Last Rate Last Dose  . alum & mag hydroxide-simeth (MAALOX/MYLANTA) 200-200-20 MG/5ML suspension 30 mL  30 mL Oral Q4H PRN Okonkwo, Justina A, NP      . hydrOXYzine (ATARAX/VISTARIL) tablet 25 mg  25 mg Oral TID PRN Lu Duffel, Justina A, NP   25 mg at 02/13/17 0249  . ibuprofen (ADVIL,MOTRIN) tablet 600 mg  600 mg Oral Q8H PRN Cobos, Fernando A, MD      . Influenza vac split quadrivalent PF (FLUARIX) injection 0.5 mL  0.5 mL Intramuscular Tomorrow-1000 Lewis, Marcy Panning, NP      . magnesium hydroxide (MILK OF MAGNESIA) suspension 30 mL  30 mL Oral Daily PRN Okonkwo, Justina A, NP      . nicotine (NICODERM CQ - dosed in mg/24 hours) patch 21 mg  21 mg Transdermal Daily Derrill Center, NP   21 mg at 02/13/17 0820  . QUEtiapine (SEROQUEL) tablet 100 mg  100 mg Oral QHS Cobos, Fernando A, MD      . QUEtiapine (SEROQUEL) tablet 25 mg  25 mg Oral BID Cobos, Myer Peer, MD      . QUEtiapine (SEROQUEL) tablet 25 mg  25 mg Oral Once Cobos, Fernando A, MD      . sertraline (ZOLOFT) tablet 25 mg  25 mg Oral Daily Cobos, Myer Peer, MD   25 mg at 02/13/17 1118  . traZODone (DESYREL) tablet 50 mg  50 mg Oral QHS PRN Okonkwo, Justina A, NP   50 mg at 02/12/17 2225   PTA Medications: Prescriptions Prior to Admission  Medication Sig Dispense Refill Last Dose  . citalopram (CELEXA) 20 MG tablet Take 20 mg by mouth daily.   Past Month at Unknown time  . QUEtiapine (SEROQUEL) 300 MG tablet Take 1 tablet (300 mg total) by mouth at bedtime. 30 tablet 0 Past Week at Unknown time  . traZODone (DESYREL) 100 MG tablet Take 1 tablet (100 mg total) by mouth at bedtime. 30  tablet 0 Past Week at Unknown time  . sertraline (ZOLOFT) 50 MG tablet Take 1 tablet (50 mg total) by mouth daily. (Patient not taking: Reported on 02/13/2017) 30 tablet 0 Not Taking at Unknown time    Patient Stressors: Medication change or noncompliance Substance abuse  Patient Strengths: Ability for insight General fund of knowledge  Treatment Modalities: Medication Management, Group therapy, Case management,  1 to 1 session with clinician, Psychoeducation, Recreational therapy.   Physician Treatment Plan for Primary Diagnosis: Bipolar 1 disorder ([place]) Long Term Goal(s):     Short Term Goals:    Medication Management: Evaluate patient's response, side effects, and tolerance of medication regimen.  Therapeutic Interventions: 1 to 1 sessions, Unit Group sessions and Medication administration.  Evaluation of Outcomes: Not Met  Physician Treatment Plan for Secondary Diagnosis: Principal Problem:   Bipolar 1 disorder ([place])  Long Term Goal(s):     Short Term Goals:       Medication Management: Evaluate patient's response, side effects, and tolerance of medication regimen.  Therapeutic Interventions: 1 to 1 sessions, Unit Group sessions and Medication administration.  Evaluation of Outcomes: Not Met   RN Treatment Plan for Primary Diagnosis: Bipolar 1 disorder ([place]) Long Term Goal(s): Knowledge of  disease and therapeutic regimen to maintain health will improve  Short Term Goals: Ability to participate in decision making will improve, Ability to identify and develop effective coping behaviors will improve and Compliance with prescribed medications will improve  Medication Management: RN will administer medications as ordered by provider, will assess and evaluate patient's response and provide education to patient for prescribed medication. RN will report any adverse and/or side effects to prescribing provider.  Therapeutic Interventions: 1 on 1 counseling sessions,  Psychoeducation, Medication administration, Evaluate responses to treatment, Monitor vital signs and CBGs as ordered, Perform/monitor CIWA, COWS, AIMS and Fall Risk screenings as ordered, Perform wound care treatments as ordered.  Evaluation of Outcomes: Not Met   LCSW Treatment Plan for Primary Diagnosis: Bipolar 1 disorder ([place]) Long Term Goal(s): Safe transition to appropriate next level of care at discharge, Engage patient in therapeutic group addressing interpersonal concerns.  Short Term Goals: Engage patient in aftercare planning with referrals and resources, Increase emotional regulation, Identify triggers associated with mental health/substance abuse issues and Increase skills for wellness and recovery  Therapeutic Interventions: Assess for all discharge needs, 1 to 1 time with Social worker, Explore available resources and support systems, Assess for adequacy in community support network, Educate family and significant other(s) on suicide prevention, Complete Psychosocial Assessment, Interpersonal group therapy.  Evaluation of Outcomes: Not Met   Progress in Treatment: Attending groups: No. new to unit Participating in groups: No.new to unit Taking medication as prescribed: Yes.  MD assessing for appropriate medication regimen Toleration medication: Yes. Family/Significant other contact made: No, will contact:  collateral w patient consent Patient understands diagnosis: No. CSW assessing Discussing patient identified problems/goals with staff: Yes. Medical problems stabilized or resolved: Yes. MD and RN monitoring Denies suicidal/homicidal ideation: Yes. and As evidenced by:  contracts for safety on  unit, admitted w suicidal ideation and recent overdose Issues/concerns per patient self-inventory: No. Other: NA  New problem(s) identified: Yes, Describe:  CSW assessing for needs  New Short Term/Long Term Goal(s):  Discharge Plan or Barriers: return home, follow up  outpatient  Reason for Continuation of Hospitalization: Depression Medication stabilization Suicidal ideation  Estimated Length of Stay:  3 - 5 days; 02/16/17  Attendees: Patient:  [name] 02/13/2017 1:25 PM  Physician: Gabriel Earing MD 02/13/2017 1:25 PM  Nursing: Rise Paganini RN 02/13/2017 1:25 PM  RN Care Manager: 02/13/2017 1:25 PM  Social Worker: Eusebio Me LCSW 02/13/2017 1:25 PM  Recreational Therapist:  02/13/2017 1:25 PM  Other:  02/13/2017 1:25 PM  Other:  02/13/2017 1:25 PM  Other: 02/13/2017 1:25 PM    Scribe for Treatment Team: Beverely Pace, LCSW 02/13/2017 1:25 PM

## 2017-02-13 NOTE — Progress Notes (Signed)
Patient ID: [name], male   DOB: 10/17/1990, 26 y.o.   MRN: [id number]  Writer was able to obtain patient's orthostatic vital signs this morning and reported results to MD Cobos as patient's BP decreased significantly and his pulse elevated significantly upon standing. Patient was given snacks and a pitcher of Gatorade and encouraged patient to drink the pitcher of Gatorade. Patient verbalized understanding. He reported, "I think it's the meth and me not eating...or drinking." Patient is steady on his feet at this time. However, he does report falling earlier this morning.

## 2017-02-13 NOTE — [facility] Suicide Risk Assessment (Addendum)
[facility] Admission Suicide Risk Assessment   Nursing information obtained from:  Patient Demographic factors:  Male, Caucasian, Low socioeconomic status Current Mental Status:  Suicidal ideation indicated by patient Loss Factors:  Financial problems / change in socioeconomic status Historical Factors:  NA Risk Reduction Factors:  Sense of responsibility to family  Total Time spent with patient: 45 minutes Principal Problem:  Bipolar Disorder, Mixed,consider Substance Induced Mood Disorder, Opiate Use Disorder, Amphetamine Use Disorder   Diagnosis:   Patient Active Problem List   Diagnosis Date Noted  . Bipolar 1 disorder ([facility]) [F31.9] 02/12/2017  . Polysubstance abuse ([facility]) [F19.10]   . Recurrent major depression-severe ([facility]) [F33.2] 08/07/2016  . Substance induced mood disorder ([facility]) [F19.94]   . Atypical depression [F32.89]   . Polysubstance (including opioids) dependence with physiol dependence (HCC) [F19.20] 08/23/2014  . Sigmoid diverticulitis s/p lap assisted sigmoid colectomy 07/01/14 [K57.32] 07/01/2014  . Diverticulitis [K57.92] 12/06/2013  . Diverticulitis of large intestine without perforation or abscess without bleeding [K57.32] 12/06/2013  . Obesity, unspecified [E66.9] 12/06/2013  . Acute diverticulitis [K57.92] 12/06/2013  . NAUSEA AND VOMITING [R11.2] 02/09/2009  . OTHER SYMPTOMS INVOLVING DIGESTIVE SYSTEM OTHER [R19.8] 02/09/2009    Continued Clinical Symptoms:  Alcohol Use Disorder Identification Test Final Score (AUDIT): 0 The "Alcohol Use Disorders Identification Test", Guidelines for Use in Primary Care, Second Edition.  World Science writer [facility]). Score between 0-7:  no or low risk or alcohol related problems. Score between 8-15:  moderate risk of alcohol related problems. Score between 16-19:  high risk of alcohol related problems. Score 20 or above:  warrants further diagnostic evaluation for alcohol dependence and treatment.   CLINICAL FACTORS:  26 year  old single male, no children, was living with aunt prior to admission. Currently unemployed ( recently lost job) .  Reports worsening depression, thoughts of overdosing, auditory hallucinations " arguing my decisions" Denies command hallucinations. In addition he also reports irritability, racing thoughts. Endorses neuro-vegetative symptoms-  poor sleep, poor appetite , low energy level, anhedonia. States he has lost " 80 lbs over the last year".  History of prior psychiatric admissions, most recently March 2018. At the time he was discharged on Zoloft, Seroquel, Minipress. States medication combination was recently changed as an outpatient to Celexa and Seroquel .  Describes episodes suggestive of hypomania, but states his mood did tend to improve during a past period of sobriety .   Reports substance abuse, has been abusing methamphetamine, heroin ( IV) several times a week, denies alcohol or BZD abuse . Last used heroin and methamphetamine 5-6 days ago.  Medical History is remarkable for Hep C (+) status, history of Diverticulitis - history of hemicolectomy  Of note, denies current symptoms of opiate withdrawal- denies nausea , vomiting or diarrhea, denies any piloerection, does not present with lacrimation, pupillary dilation , or repeated yawning . States last used heroin 6 days ago.   Dx- Opiate Abuse, Amphetamine Abuse, Substance Induced Mood Disorder versus Bipolar Disorder, Mixed   Plan- inpatient admission- patient reports history of good response to Seroquel/Zoloft  in the past , states he tolerated these medications well . Start Seroquel 25 mgrs BID and 100 mgrs QHS Start Zoloft 25 mgrs QDAY  Push PO fluids to minimize dizziness /orthostasis.  * LFTs are elevated - AST and ALT have spiked compared to prior results on 9/1 ( denies any alcohol consumption, denies acetaminophen use, does not endorse any recent new medication trial other than  Celexa  )  Have consulted  Hospitalist  Service   Musculoskeletal: Strength & Muscle Tone: within normal limits Gait & Station: normal Patient leans: N/A  Psychiatric Specialty Exam: Physical Exam  ROS denies chest pain, no shortness of breath, no vomiting, no diarrhea, no acholia, no choluria, no cramps, no myalgias, feels light headed at times   Blood pressure 125/72, pulse 83, temperature 98.5 F (36.9 C), temperature source Oral, resp. rate 18, height  (1.854 m), weight 99.8 kg (220 lb), SpO2 100 %.Body mass index is 29.03 kg/m.  General Appearance: Fairly Groomed  Eye Contact:  Fair  Speech:  Normal Rate  Volume:  Normal  Mood:  Depressed and Dysphoric  Affect:  constricted, anxious   Thought Process:  Linear and Descriptions of Associations: Intact  Orientation:  Other:  fully alert and attentive   Thought Content:  denies current hallucinations, does not appear internally preoccupied   Suicidal Thoughts:  No denies current plan or intention of suicide and contracts for safety on unit, presents future oriented, stating he wants to go to a long term residential program at discharge  Homicidal Thoughts:  No  Memory:  recent and remote grossly intact   Judgement:  Other:  fair   Insight:  fair   Psychomotor Activity:  Normal- no tremors, no diaphoresis  Concentration:  Concentration: Fair and Attention Span: Fair  Recall:  Good  Fund of Knowledge:  Good  Language:  Good  Akathisia:  Negative  Handed:  Right  AIMS (if indicated):     Assets:  Desire for Improvement Resilience  ADL's:  Intact  Cognition:  WNL  Sleep:  Number of Hours: 4.25      COGNITIVE FEATURES THAT CONTRIBUTE TO RISK:  Closed-mindedness and Loss of executive function    SUICIDE RISK:   Moderate:  Frequent suicidal ideation with limited intensity, and duration, some specificity in terms of plans, no associated intent, good self-control, limited dysphoria/symptomatology, some risk factors present, and identifiable protective factors,  including available and accessible social support.  PLAN OF CARE: Patient will be admitted to inpatient psychiatric unit for stabilization and safety. Will provide and encourage milieu participation. Provide medication management and maked adjustments as needed.  Will follow daily.    I certify that inpatient services furnished can reasonably be expected to improve the patient's condition.   Craige Cotta, MD 02/13/2017, 10:24 AM

## 2017-02-14 ENCOUNTER — Ambulatory Visit (HOSPITAL_COMMUNITY)
Admit: 2017-02-14 | Discharge: 2017-02-14 | Disposition: A | Discharge status: Home / Self Care | Payer: Federal, State, Local not specified - Other | Attending: Family Medicine | Admitting: Family Medicine

## 2017-02-14 DIAGNOSIS — G2581 Restless legs syndrome: Secondary | ICD-10-CM

## 2017-02-14 LAB — CBC WITH DIFFERENTIAL/PLATELET
Basophils Absolute: 0 10*3/uL (ref 0.0–0.1)
Basophils Relative: 0 %
Eosinophils Absolute: 0.1 10*3/uL (ref 0.0–0.7)
Eosinophils Relative: 1 %
HCT: 42.2 % (ref 39.0–52.0)
Hemoglobin: 14.5 g/dL (ref 13.0–17.0)
Lymphocytes Relative: 30 %
Lymphs Abs: 2 10*3/uL (ref 0.7–4.0)
MCH: 28.2 pg (ref 26.0–34.0)
MCHC: 34.4 g/dL (ref 30.0–36.0)
MCV: 81.9 fL (ref 78.0–100.0)
Monocytes Absolute: 0.5 10*3/uL (ref 0.1–1.0)
Monocytes Relative: 8 %
Neutro Abs: 4 10*3/uL (ref 1.7–7.7)
Neutrophils Relative %: 61 %
Platelets: 250 10*3/uL (ref 150–400)
RBC: 5.15 MIL/uL (ref 4.22–5.81)
RDW: 13.8 % (ref 11.5–15.5)
WBC: 6.6 10*3/uL (ref 4.0–10.5)

## 2017-02-14 LAB — HEPATIC FUNCTION PANEL
ALT: 256 U/L — ABNORMAL HIGH (ref 17–63)
AST: 89 U/L — ABNORMAL HIGH (ref 15–41)
Albumin: 3.7 g/dL (ref 3.5–5.0)
Alkaline Phosphatase: 85 U/L (ref 38–126)
Bilirubin, Direct: <0.1 mg/dL — ABNORMAL LOW (ref 0.1–0.5)
Total Bilirubin: 0.7 mg/dL (ref 0.3–1.2)
Total Protein: 7.2 g/dL (ref 6.5–8.1)

## 2017-02-14 MED ORDER — DOCUSATE SODIUM 100 MG PO CAPS
100.0000 mg | ORAL_CAPSULE | Freq: Two times a day (BID) | ORAL | Status: DC
  Administered 2017-02-14 – 2017-02-16 (×4): 100 mg via ORAL
  Filled 2017-02-14 (×6): qty 1

## 2017-02-14 MED ORDER — GABAPENTIN 100 MG PO CAPS
200.0000 mg | ORAL_CAPSULE | Freq: Every day | ORAL | Status: DC
  Administered 2017-02-14 – 2017-02-15 (×2): 200 mg via ORAL
  Filled 2017-02-14 (×4): qty 2

## 2017-02-14 MED ORDER — POLYETHYLENE GLYCOL 3350 17 G PO PACK: 17.0000 g | PACK | Freq: Every day | ORAL | Status: DC | PRN

## 2017-02-14 MED ORDER — TRAZODONE HCL 50 MG PO TABS
50.0000 mg | ORAL_TABLET | Freq: Every evening | ORAL | Status: DC | PRN
  Administered 2017-02-14 – 2017-02-15 (×3): 50 mg via ORAL
  Filled 2017-02-14 (×5): qty 1

## 2017-02-14 NOTE — Progress Notes (Signed)
[facility] MD Progress Note  02/14/2017 12:51 PM [name]  MRN:  [id number]  Subjective:  Patient reports that he feels a lot better today. "I feel calmer and not as fidgety." He reports that the Seroquel has helped a lot and he likes the medication any ways. He has one complaint of restless leg at night and would like something for it. He denies any SI/HI/AVH and reports depression at 7/10, but improving.  Objective: Patient's chart and findings reviewed and discussed with treatment team. Patient is pleasant and cooperative. Due to consistent complaint about constipation, will start patient on Docusate Sodium BID and Miralax Daily PRN. Patient is still seeking long term treatment, but is will ing to do short term then long term if he needs to. CSW working of treatment placement. Will start Gabapentin 200 mg PO QHS for restless leg.   Principal Problem: Bipolar 1 disorder ([place]) Diagnosis:   Patient Active Problem List   Diagnosis Date Noted  . Bipolar 1 disorder ([place]) [F31.9] 02/12/2017  . Polysubstance abuse ([place]) [F19.10]   . Recurrent major depression-severe ([place]) [F33.2] 08/07/2016  . Substance induced mood disorder ([place]) [F19.94]   . Atypical depression [F32.89]   . Polysubstance (including opioids) dependence with physiol dependence ([place]) [F19.20] 08/23/2014  . Sigmoid diverticulitis s/p lap assisted sigmoid colectomy 07/01/14 [K57.32] 07/01/2014  . Diverticulitis [K57.92] 12/06/2013  . Diverticulitis of large intestine without perforation or abscess without bleeding [K57.32] 12/06/2013  . Obesity, unspecified [E66.9] 12/06/2013  . Acute diverticulitis [K57.92] 12/06/2013  . NAUSEA AND VOMITING [R11.2] 02/09/2009  . OTHER SYMPTOMS INVOLVING DIGESTIVE SYSTEM OTHER [R19.8] 02/09/2009   Total Time spent with patient: 15 minutes  Past Psychiatric History: See H&P  Past Medical History:  Past Medical History:  Diagnosis Date  . Anxiety   . Attention deficit disorder   . Depression    . Diverticulitis   . Hiccups    pt states hiccups during sleep     Past Surgical History:  Procedure Laterality Date  . colonscopy      removed polyps   . LAPAROSCOPIC PARTIAL COLECTOMY N/A 07/01/2014   Procedure: LAPAROSCOPIC ASSISTED SIGMOID COLECTOMY WITH MOBILIZATION OF SPLENIC FLEXURE;  Surgeon: Jackolyn Confer, MD;  Location: [facility];  Service: General;  Laterality: N/A;   Family History: History reviewed. No pertinent family history. Family Psychiatric  History: See H&P Social History:  History  Alcohol Use  . Yes    Comment: occasionally     History  Drug Use  . Types: "Crack" cocaine, Heroin, Methamphetamines, Marijuana, Cocaine    Comment: clean since 11/30/15    Social History   Social History  . Marital status: Single    Spouse name: N/A  . Number of children: N/A  . Years of education: N/A   Social History Main Topics  . Smoking status: Never Smoker  . Smokeless tobacco: Current User    Types: Chew  . Alcohol use Yes     Comment: occasionally  . Drug use: Yes    Types: "Crack" cocaine, Heroin, Methamphetamines, Marijuana, Cocaine     Comment: clean since 11/30/15  . Sexual activity: Yes    Birth control/ protection: Condom   Other Topics Concern  . None   Social History Narrative  . None   Additional Social History:    Pain Medications: none Prescriptions: none Over the Counter: none History of alcohol / drug use?: Yes Longest period of sobriety (when/how long): pt states that he has minimal clean time Negative Consequences  of Use: Personal relationships, Financial Withdrawal Symptoms:  (none reported) Name of Substance 1: heroin 1 - Age of First Use: 16 1 - Amount (size/oz): gram 1 - Frequency: daily 1 - Duration: 10 years 1 - Last Use / Amount: last use was 3 days ago Name of Substance 2: methamphetamine 2 - Age of First Use: 24 2 - Amount (size/oz): 1/2 gram 2 - Frequency: daily 2 - Duration: 2 years 2 - Last Use / Amount: 5 days  ago                Sleep: Fair  Appetite:  Good  Current Medications: Current Facility-Administered Medications  Medication Dose Route Frequency Provider Last Rate Last Dose  . alum & mag hydroxide-simeth (MAALOX/MYLANTA) 200-200-20 MG/5ML suspension 30 mL  30 mL Oral Q4H PRN Okonkwo, Justina A, NP      . docusate sodium (COLACE) capsule 100 mg  100 mg Oral BID Money, Lowry Ram, FNP      . hydrOXYzine (ATARAX/VISTARIL) tablet 25 mg  25 mg Oral TID PRN Lu Duffel, Justina A, NP   25 mg at 02/13/17 2221  . ibuprofen (ADVIL,MOTRIN) tablet 600 mg  600 mg Oral Q8H PRN Perrie Ragin A, MD      . Influenza vac split quadrivalent PF (FLUARIX) injection 0.5 mL  0.5 mL Intramuscular Tomorrow-1000 Lewis, Marcy Panning, NP      . magnesium hydroxide (MILK OF MAGNESIA) suspension 30 mL  30 mL Oral Daily PRN Okonkwo, Justina A, NP      . nicotine (NICODERM CQ - dosed in mg/24 hours) patch 21 mg  21 mg Transdermal Daily Derrill Center, NP   21 mg at 02/14/17 0847  . polyethylene glycol (MIRALAX / GLYCOLAX) packet 17 g  17 g Oral Daily PRN Money, Lowry Ram, FNP      . QUEtiapine (SEROQUEL) tablet 100 mg  100 mg Oral QHS Dhanvi Boesen, Myer Peer, MD   100 mg at 02/13/17 2103  . QUEtiapine (SEROQUEL) tablet 25 mg  25 mg Oral BID Idil Maslanka, Myer Peer, MD   25 mg at 02/14/17 0846  . QUEtiapine (SEROQUEL) tablet 25 mg  25 mg Oral Once Demyah Smyre A, MD      . sertraline (ZOLOFT) tablet 25 mg  25 mg Oral Daily Aldea Avis, Myer Peer, MD   25 mg at 02/14/17 0846  . traZODone (DESYREL) tablet 50 mg  50 mg Oral QHS PRN Lu Duffel, Justina A, NP   50 mg at 02/13/17 2219    Lab Results:  Results for orders placed or performed during the hospital encounter of 02/12/17 (from the past 48 hour(s))  CBC     Status: None   Collection Time: 02/12/17  6:30 PM  Result Value Ref Range   WBC 9.8 4.0 - 10.5 K/uL   RBC 5.24 4.22 - 5.81 MIL/uL   Hemoglobin 15.3 13.0 - 17.0 g/dL   HCT 44.0 39.0 - 52.0 %   MCV 84.0 78.0 - 100.0 fL   MCH  29.2 26.0 - 34.0 pg   MCHC 34.8 30.0 - 36.0 g/dL   RDW 13.9 11.5 - 15.5 %   Platelets 259 150 - 400 K/uL    Comment: Performed at [facility], [place] [address] [place], [place] [address]  Comprehensive metabolic panel     Status: Abnormal   Collection Time: 02/12/17  6:30 PM  Result Value Ref Range   Sodium 135 135 - 145 mmol/L   Potassium 3.8 3.5 - 5.1  mmol/L   Chloride 99 (L) 101 - 111 mmol/L   CO2 27 22 - 32 mmol/L   Glucose, Bld 141 (H) 65 - 99 mg/dL   BUN 16 6 - 20 mg/dL   Creatinine, Ser 1.00 0.61 - 1.24 mg/dL   Calcium 9.8 8.9 - 10.3 mg/dL   Total Protein 8.2 (H) 6.5 - 8.1 g/dL   Albumin 4.3 3.5 - 5.0 g/dL   AST 163 (H) 15 - 41 U/L   ALT 382 (H) 17 - 63 U/L   Alkaline Phosphatase 110 38 - 126 U/L   Total Bilirubin 0.9 0.3 - 1.2 mg/dL   GFR calc non Af Amer >60 >60 mL/min   GFR calc Af Amer >60 >60 mL/min    Comment: (NOTE) The eGFR has been calculated using the CKD EPI equation. This calculation has not been validated in all clinical situations. eGFR's persistently <60 mL/min signify possible Chronic Kidney Disease.    Anion gap 9 5 - 15    Comment: Performed at [facility], [place] [address] [place], [place] [address]  Hemoglobin A1c     Status: None   Collection Time: 02/12/17  6:30 PM  Result Value Ref Range   Hgb A1c MFr Bld 4.8 4.8 - 5.6 %    Comment: (NOTE) Pre diabetes:          5.7%-6.4% Diabetes:              >6.4% Glycemic control for   <7.0% adults with diabetes    Mean Plasma Glucose 91.06 mg/dL    Comment: Performed at [place] [address], [place], [place] [address]  Urinalysis, Routine w reflex microscopic     Status: Abnormal   Collection Time: 02/12/17  9:50 PM  Result Value Ref Range   Color, Urine YELLOW YELLOW   APPearance TURBID (A) CLEAR   Specific Gravity, Urine 1.027 1.005 - 1.030   pH 6.0 5.0 - 8.0   Glucose, UA NEGATIVE NEGATIVE mg/dL   Hgb urine dipstick NEGATIVE NEGATIVE    Bilirubin Urine NEGATIVE NEGATIVE   Ketones, ur 5 (A) NEGATIVE mg/dL   Protein, ur NEGATIVE NEGATIVE mg/dL   Nitrite NEGATIVE NEGATIVE   Leukocytes, UA NEGATIVE NEGATIVE   RBC / HPF NONE SEEN 0 - 5 RBC/hpf   WBC, UA NONE SEEN 0 - 5 WBC/hpf   Bacteria, UA FEW (A) NONE SEEN   Squamous Epithelial / LPF NONE SEEN NONE SEEN   WBC Clumps PRESENT    Mucus PRESENT    Ca Oxalate Crys, UA PRESENT     Comment: Performed at [facility], [place] [address], [place], [place] [address]  Urine rapid drug screen (hosp performed)not at [facility]     Status: Abnormal   Collection Time: 02/12/17  9:50 PM  Result Value Ref Range   Opiates NONE DETECTED NONE DETECTED   Cocaine POSITIVE (A) NONE DETECTED   Benzodiazepines NONE DETECTED NONE DETECTED   Amphetamines POSITIVE (A) NONE DETECTED   Tetrahydrocannabinol POSITIVE (A) NONE DETECTED   Barbiturates NONE DETECTED NONE DETECTED    Comment:        DRUG SCREEN FOR MEDICAL PURPOSES ONLY.  IF CONFIRMATION IS NEEDED FOR ANY PURPOSE, NOTIFY LAB WITHIN 5 DAYS.        LOWEST DETECTABLE LIMITS FOR URINE DRUG SCREEN Drug Class       Cutoff (ng/mL) Amphetamine      1000 Barbiturate      200 Benzodiazepine  161 Tricyclics       096 Opiates          300 Cocaine          300 THC              50 Performed at [facility], [place] [address] [place], [place] [address]   Lipid panel     Status: None   Collection Time: 02/13/17  6:41 PM  Result Value Ref Range   Cholesterol 146 0 - 200 mg/dL   Triglycerides 85 <150 mg/dL   HDL 43 >40 mg/dL   Total CHOL/HDL Ratio 3.4 RATIO   VLDL 17 0 - 40 mg/dL   LDL Cholesterol 86 0 - 99 mg/dL    Comment:        Total Cholesterol/HDL:CHD Risk Coronary Heart Disease Risk Table                     Men   Women  1/2 Average Risk   3.4   3.3  Average Risk       5.0   4.4  2 X Average Risk   9.6   7.1  3 X Average Risk  23.4   11.0        Use the calculated Patient Ratio above and the CHD  Risk Table to determine the patient's CHD Risk.        ATP III CLASSIFICATION (LDL):  <100     mg/dL   Optimal  100-129  mg/dL   Near or Above                    Optimal  130-159  mg/dL   Borderline  160-189  mg/dL   High  >190     mg/dL   Very High Performed at [place] [address] [place], [place] [address]   Hepatic function panel     Status: Abnormal   Collection Time: 02/14/17  6:42 AM  Result Value Ref Range   Total Protein 7.2 6.5 - 8.1 g/dL   Albumin 3.7 3.5 - 5.0 g/dL   AST 89 (H) 15 - 41 U/L   ALT 256 (H) 17 - 63 U/L   Alkaline Phosphatase 85 38 - 126 U/L   Total Bilirubin 0.7 0.3 - 1.2 mg/dL   Bilirubin, Direct <0.1 (L) 0.1 - 0.5 mg/dL   Indirect Bilirubin NOT CALCULATED 0.3 - 0.9 mg/dL    Comment: Performed at [facility], [place] [address] [place], [place] [address]  CBC with Differential/Platelet     Status: None   Collection Time: 02/14/17  6:42 AM  Result Value Ref Range   WBC 6.6 4.0 - 10.5 K/uL   RBC 5.15 4.22 - 5.81 MIL/uL   Hemoglobin 14.5 13.0 - 17.0 g/dL   HCT 42.2 39.0 - 52.0 %   MCV 81.9 78.0 - 100.0 fL   MCH 28.2 26.0 - 34.0 pg   MCHC 34.4 30.0 - 36.0 g/dL   RDW 13.8 11.5 - 15.5 %   Platelets 250 150 - 400 K/uL   Neutrophils Relative % 61 %   Neutro Abs 4.0 1.7 - 7.7 K/uL   Lymphocytes Relative 30 %   Lymphs Abs 2.0 0.7 - 4.0 K/uL   Monocytes Relative 8 %   Monocytes Absolute 0.5 0.1 - 1.0 K/uL   Eosinophils Relative 1 %   Eosinophils Absolute 0.1 0.0 - 0.7 K/uL   Basophils Relative  0 %   Basophils Absolute 0.0 0.0 - 0.1 K/uL    Comment: Performed at [facility], [place] [address], [place], [place] [address]    Blood Alcohol level:  Lab Results  Component Value Date   [facility] <5 01/14/2017   ETH <5 97/41/6384    Metabolic Disorder Labs: Lab Results  Component Value Date   HGBA1C 4.8 02/12/2017   MPG 91.06 02/12/2017   MPG 97 08/08/2016   No results found for: PROLACTIN Lab Results   Component Value Date   CHOL 146 02/13/2017   TRIG 85 02/13/2017   HDL 43 02/13/2017   CHOLHDL 3.4 02/13/2017   VLDL 17 02/13/2017   LDLCALC 86 02/13/2017   LDLCALC 102 (H) 08/08/2016    Physical Findings: AIMS:  , ,  ,  ,    CIWA:    COWS:     Musculoskeletal: Strength & Muscle Tone: within normal limits Gait & Station: normal Patient leans: N/A  Psychiatric Specialty Exam: Physical Exam  Nursing note and vitals reviewed. Constitutional: He is oriented to person, place, and time. He appears well-developed and well-nourished.  Musculoskeletal: Normal range of motion.  Neurological: He is alert and oriented to person, place, and time.  Skin: Skin is warm.    Review of Systems  Constitutional: Negative.   HENT: Negative.   Eyes: Negative.   Respiratory: Negative.   Cardiovascular: Negative.   Gastrointestinal: Positive for constipation.  Genitourinary: Negative.   Musculoskeletal: Negative.   Skin: Negative.   Neurological: Negative.   Endo/Heme/Allergies: Negative.     Blood pressure 123/70, pulse (!) 58, temperature 98.3 F (36.8 C), temperature source Oral, resp. rate 16, height '6\' 1"'  (1.854 m), weight 99.8 kg (220 lb), SpO2 100 %.Body mass index is 29.03 kg/m.  General Appearance: Casual  Eye Contact:  Good  Speech:  Clear and Coherent and Normal Rate  Volume:  Normal  Mood:  Anxious  Affect:  Flat  Thought Process:  Coherent and Descriptions of Associations: Intact  Orientation:  Full (Time, Place, and Person)  Thought Content:  WDL  Suicidal Thoughts:  No  Homicidal Thoughts:  No  Memory:  Immediate;   Good Recent;   Good Remote;   Good  Judgement:  Fair  Insight:  Good  Psychomotor Activity:  Normal  Concentration:  Concentration: Good and Attention Span: Good  Recall:  Good  Fund of Knowledge:  Good  Language:  Good  Akathisia:  No  Handed:  Right  AIMS (if indicated):     Assets:  Desire for Improvement Financial  Resources/Insurance Housing Social Support Transportation  ADL's:  Intact  Cognition:  WNL  Sleep:  Number of Hours: 4.25     Treatment Plan Summary: Daily contact with patient to assess and evaluate symptoms and progress in treatment, Medication management and Plan is to:  -Continue Seroquel 100 mg PO QHS for mood stability -Continue Seroquel 25 mg PO BID for mood stability -Continue Zoloft 25 mg PO Daily for mood stability -Continue Trazodone 50 mg PO QHS PRN for insomnia -Continue Vistaril 25 mg PO TID PRN for anxiety -Start Docusate Sodium 100 mg PO BID for constipation -Start Miralax 17 grams Daily PRN for moderate constipation -Start Gabapentin 200 mg PO QHS for restless leg -Encourage Group therapy participation  Lewis Shock, FNP 02/14/2017, 12:51 PM   Agree with NP Progress Note

## 2017-02-14 NOTE — Progress Notes (Signed)
Pt seen and examined at bedside. Pt is hemodynamically stable, physical exam including abd exam is unremarkable. Blood work indicates liver function tests are improving, trending down. Abd [place] results reviewed and outlined below:  IMPRESSION: Mildly contracted gallbladder. No stones, sludge, or sonographic evidence of acute cholecystitis.  Probable hemangioma in the left hepatic lobe. No acute hepatic abnormality is observed. If the transaminitis persists however, MRI of the liver would be a useful next imaging step.   RECOMMENDATIONS: 1. REPEAT LFT'S IN 4-6 WEEKS POST DISCHARGE 2. IF LFT'S TRENDING UP ON FOLLOW UP, MAY NEED TO REPEAT ABD [place] OR AS OUTLINED ABOVE,  CONSIDER MRI LIVER 3. PLEASE CALL us WITH ANY OTHER QUESTIONS  Debbora Presto, MD  [facility] Pager [phone]  If 7PM-7AM, please contact night-coverage [email or web address] Password [email or web address]

## 2017-02-14 NOTE — Progress Notes (Signed)
Pt accepted for treatment to [facility] on 10/4 at 12:00pm.  Vernie Shanks, LCSW Clinical Social Work [phone]

## 2017-02-14 NOTE — Progress Notes (Signed)
DAR NOTE: Patient presents with bright affect and jovial mood. Pt went for [place] at [facility] this morning after being NPO since midnight, tolerated the procedure well. Pt stated he did not have a good night last night, had episode of shakiness and feeling like he was crawling out of his skin. Denies pain, auditory and visual hallucinations.  Rates depression at 4, hopelessness at 3, and anxiety at 2.  Maintained on routine safety checks.  Medications given as prescribed.  Support and encouragement offered as needed. Patient observed socializing with peers in the dayroom.  Offered no complaint.

## 2017-02-14 NOTE — Progress Notes (Signed)
Recreation Therapy Notes  Animal-Assisted Activity (AAA) Program Checklist/Progress Notes Patient Eligibility Criteria Checklist & Daily Group note for Rec TxIntervention  Date: 10.02.2018 Time: 11:25am Location: [address] Dayroom   AAA/T Program Assumption of Risk Form signed by Patient/ or Parent Legal Guardian Yes  Patient is free of allergies or sever asthma Yes  Patient reports no fear of animals Yes  Patient reports no history of cruelty to animals Yes  Patient understands his/her participation is voluntary Yes  Patient washes hands before animal contact Yes  Patient washes hands after animal contact Yes  Behavioral Response: Appropriate   Education:Hand Washing, Appropriate Animal Interaction   Education Outcome: Acknowledges education.   Clinical Observations/Feedback: Patient attended session and interacted appropriately with therapy dog and peers.   [name], LRT/CTRS        [name] L 02/14/2017 2:30 PM 

## 2017-02-14 NOTE — Progress Notes (Signed)
Patient ID: [name], male   DOB: 02/08/1991, 26 y.o.   MRN: [id number] Pt resting in bed.  Pleasant/cooperative.  Pt reports his day is better than it was today but did not share specifics.  Pt stated he has been up and attending all groups today.  Pt denied SI, HI, and AVH.  Needs assessed.  Pt denied.  Fifteen minute checks in progress for patient safety.  Pt safe on unit.

## 2017-02-14 NOTE — B[facility] Group Notes (Signed)
LCSW Group Therapy Note  02/14/2017 1:15pm  Type of Therapy/Topic:  Group Therapy:  Feelings about Diagnosis  Participation Level:  Minimal   Description of Group:   This group will allow patients to explore their thoughts and feelings about diagnoses they have received. Patients will be guided to explore their level of understanding and acceptance of these diagnoses. Facilitator will encourage patients to process their thoughts and feelings about the reactions of others to their diagnosis and will guide patients in identifying ways to discuss their diagnosis with significant others in their lives. This group will be process-oriented, with patients participating in exploration of their own experiences, giving and receiving support, and processing challenge from other group members.   Therapeutic Goals: 1. Patient will demonstrate understanding of diagnosis as evidenced by identifying two or more symptoms of the disorder 2. Patient will be able to express two feelings regarding the diagnosis 3. Patient will demonstrate their ability to communicate their needs through discussion and/or role play  Summary of Patient Progress: Pt participated when prompted and identified his family having trouble understanding his self-medication with substances. Pt reports that he suffers from depression and PTSD which causes him to want to numb his feelings.     Therapeutic Modalities:   Cognitive Behavioral Therapy Brief Therapy Feelings Identification    Verdene Lennert, LCSW 02/14/2017 2:56 PM

## 2017-02-15 MED ORDER — QUETIAPINE FUMARATE 200 MG PO TABS
200.0000 mg | ORAL_TABLET | Freq: Every day | ORAL | Status: DC
  Administered 2017-02-15: 200 mg via ORAL
  Filled 2017-02-15 (×2): qty 1

## 2017-02-15 NOTE — Progress Notes (Signed)
Patient ID: [name], male   DOB: 09/23/1990, 26 y.o.   MRN: [id number]  D: Patient playing cards and interacting well with peers on approach. Pt mood and flat appeared anxious. Pt attended evening wrap up group and Interacted appropriately with peers. Denies  SI/HI/AVH and pain.No behavioral issues noted.  A: Pt given and encouraged to increase fluid intake. Medications administered as prescribed.  R: Patient is safe and cooperative on unit. Will continue to monitor  for safety and stability.

## 2017-02-15 NOTE — Progress Notes (Signed)
DAR NOTE: Patient presents calm anxious affect and pleasant mood. Pt stated he slept better last night after taking 100 mg of trazodone last night, fair appetite, low energy, and poor concentration. Denies pain, auditory and visual hallucinations.  Rates depression at 5, hopelessness at 0, and anxiety at 0.  Maintained on routine safety checks.  Medications given as prescribed.  Support and encouragement offered as needed.  Attended group and participated.  States goal for today is " participate in everything."  Patient observed socializing with peers in the dayroom.  Offered no complaint.

## 2017-02-15 NOTE — Progress Notes (Signed)
[facility] MD Progress Note  02/15/2017 2:32 PM [name]  MRN:  [id number]   Subjective:  Patient reports that the Gabapentin really helped a lot and he slept much better. He reports his depression at 4/10 and anxiety at 2/10. He states that is a big improvement. He feels that he has continued racing thought sand wanted to have his Seroquel increased. He also reports that he has had increased appetite as well and he likes that.  Objective: Patient's chart and findings were reviewed and discussed with treatment team. Patient is cooperative and pleasant. Due to reported continued racing thoughts, will increase Seroquel to 200 mg PO QHS and continue the Seroquel 25 mg PO BID. Patient has been seen attending groups and interacting with staff and other patients appropriately. Patient will be discharged tomorrow for ADACT.   Principal Problem: Bipolar 1 disorder (HCC) Diagnosis:   Patient Active Problem List   Diagnosis Date Noted  . Bipolar 1 disorder (HCC) [F31.9] 02/12/2017  . Polysubstance abuse (HCC) [F19.10]   . Recurrent major depression-severe (HCC) [F33.2] 08/07/2016  . Substance induced mood disorder (HCC) [F19.94]   . Atypical depression [F32.89]   . Polysubstance (including opioids) dependence with physiol dependence (HCC) [F19.20] 08/23/2014  . Sigmoid diverticulitis s/p lap assisted sigmoid colectomy 07/01/14 [K57.32] 07/01/2014  . Diverticulitis [K57.92] 12/06/2013  . Diverticulitis of large intestine without perforation or abscess without bleeding [K57.32] 12/06/2013  . Obesity, unspecified [E66.9] 12/06/2013  . Acute diverticulitis [K57.92] 12/06/2013  . NAUSEA AND VOMITING [R11.2] 02/09/2009  . OTHER SYMPTOMS INVOLVING DIGESTIVE SYSTEM OTHER [R19.8] 02/09/2009   Total Time spent with patient: 25 minutes  Past Psychiatric History: See H&P  Past Medical History:  Past Medical History:  Diagnosis Date  . Anxiety   . Attention deficit disorder   . Depression   .  Diverticulitis   . Hiccups    pt states hiccups during sleep     Past Surgical History:  Procedure Laterality Date  . colonscopy      removed polyps   . LAPAROSCOPIC PARTIAL COLECTOMY N/A 07/01/2014   Procedure: LAPAROSCOPIC ASSISTED SIGMOID COLECTOMY WITH MOBILIZATION OF SPLENIC FLEXURE;  Surgeon: Avel Peace, MD;  Location: [facility];  Service: General;  Laterality: N/A;   Family History: History reviewed. No pertinent family history. Family Psychiatric  History: See H&P Social History:  History  Alcohol Use  . Yes    Comment: occasionally     History  Drug Use  . Types: "Crack" cocaine, Heroin, Methamphetamines, Marijuana, Cocaine    Comment: clean since 11/30/15    Social History   Social History  . Marital status: Single    Spouse name: N/A  . Number of children: N/A  . Years of education: N/A   Social History Main Topics  . Smoking status: Never Smoker  . Smokeless tobacco: Current User    Types: Chew  . Alcohol use Yes     Comment: occasionally  . Drug use: Yes    Types: "Crack" cocaine, Heroin, Methamphetamines, Marijuana, Cocaine     Comment: clean since 11/30/15  . Sexual activity: Yes    Birth control/ protection: Condom   Other Topics Concern  . None   Social History Narrative  . None   Additional Social History:    Pain Medications: none Prescriptions: none Over the Counter: none History of alcohol / drug use?: Yes Longest period of sobriety (when/how long): pt states that he has minimal clean time Negative Consequences of Use: Personal relationships, Financial Withdrawal  Symptoms:  (none reported) Name of Substance 1: heroin 1 - Age of First Use: 16 1 - Amount (size/oz): gram 1 - Frequency: daily 1 - Duration: 10 years 1 - Last Use / Amount: last use was 3 days ago Name of Substance 2: methamphetamine 2 - Age of First Use: 24 2 - Amount (size/oz): 1/2 gram 2 - Frequency: daily 2 - Duration: 2 years 2 - Last Use / Amount: 5 days ago                 Sleep: Good  Appetite:  Good  Current Medications: Current Facility-Administered Medications  Medication Dose Route Frequency Provider Last Rate Last Dose  . alum & mag hydroxide-simeth (MAALOX/MYLANTA) 200-200-20 MG/5ML suspension 30 mL  30 mL Oral Q4H PRN Okonkwo, Justina A, NP      . docusate sodium (COLACE) capsule 100 mg  100 mg Oral BID Money, Feliz Beam B, FNP   100 mg at 02/15/17 0807  . gabapentin (NEURONTIN) capsule 200 mg  200 mg Oral QHS Money, Travis B, FNP   200 mg at 02/14/17 2135  . hydrOXYzine (ATARAX/VISTARIL) tablet 25 mg  25 mg Oral TID PRN Ferne Reus A, NP   25 mg at 02/14/17 2346  . ibuprofen (ADVIL,MOTRIN) tablet 600 mg  600 mg Oral Q8H PRN Cobos, Rockey Situ, MD   600 mg at 02/14/17 2135  . Influenza vac split quadrivalent PF (FLUARIX) injection 0.5 mL  0.5 mL Intramuscular Tomorrow-1000 Lewis, Tanika N, NP      . magnesium hydroxide (MILK OF MAGNESIA) suspension 30 mL  30 mL Oral Daily PRN Okonkwo, Justina A, NP      . nicotine (NICODERM CQ - dosed in mg/24 hours) patch 21 mg  21 mg Transdermal Daily Oneta Rack, NP   21 mg at 02/15/17 0808  . polyethylene glycol (MIRALAX / GLYCOLAX) packet 17 g  17 g Oral Daily PRN Money, Gerlene Burdock, FNP      . QUEtiapine (SEROQUEL) tablet 200 mg  200 mg Oral QHS Money, Gerlene Burdock, FNP      . QUEtiapine (SEROQUEL) tablet 25 mg  25 mg Oral BID Cobos, Rockey Situ, MD   25 mg at 02/15/17 0807  . QUEtiapine (SEROQUEL) tablet 25 mg  25 mg Oral Once Cobos, Fernando A, MD      . sertraline (ZOLOFT) tablet 25 mg  25 mg Oral Daily Cobos, Rockey Situ, MD   25 mg at 02/15/17 0807  . traZODone (DESYREL) tablet 50 mg  50 mg Oral QHS,MR X 1 Kerry Hough, PA-C   50 mg at 02/14/17 2350    Lab Results:  Results for orders placed or performed during the hospital encounter of 02/12/17 (from the past 48 hour(s))  Lipid panel     Status: None   Collection Time: 02/13/17  6:41 PM  Result Value Ref Range   Cholesterol 146 0 -  200 mg/dL   Triglycerides 85 <956 mg/dL   HDL 43 >21 mg/dL   Total CHOL/HDL Ratio 3.4 RATIO   VLDL 17 0 - 40 mg/dL   LDL Cholesterol 86 0 - 99 mg/dL    Comment:        Total Cholesterol/HDL:CHD Risk Coronary Heart Disease Risk Table                     Men   Women  1/2 Average Risk   3.4   3.3  Average Risk  5.0   4.4  2 X Average Risk   9.6   7.1  3 X Average Risk  23.4   11.0        Use the calculated Patient Ratio above and the CHD Risk Table to determine the patient's CHD Risk.        ATP III CLASSIFICATION (LDL):  <100     mg/dL   Optimal  161-096  mg/dL   Near or Above                    Optimal  130-159  mg/dL   Borderline  045-409  mg/dL   High  >811     mg/dL   Very High Performed at [facility], [address] [place], [place] [address]   Hepatic function panel     Status: Abnormal   Collection Time: 02/14/17  6:42 AM  Result Value Ref Range   Total Protein 7.2 6.5 - 8.1 g/dL   Albumin 3.7 3.5 - 5.0 g/dL   AST 89 (H) 15 - 41 U/L   ALT 256 (H) 17 - 63 U/L   Alkaline Phosphatase 85 38 - 126 U/L   Total Bilirubin 0.7 0.3 - 1.2 mg/dL   Bilirubin, Direct <2.9 (L) 0.1 - 0.5 mg/dL   Indirect Bilirubin NOT CALCULATED 0.3 - 0.9 mg/dL    Comment: Performed at [facility], [address] [place], [place] [address]  CBC with Differential/Platelet     Status: None   Collection Time: 02/14/17  6:42 AM  Result Value Ref Range   WBC 6.6 4.0 - 10.5 K/uL   RBC 5.15 4.22 - 5.81 MIL/uL   Hemoglobin 14.5 13.0 - 17.0 g/dL   HCT 08.6 57.8 - 46.9 %   MCV 81.9 78.0 - 100.0 fL   MCH 28.2 26.0 - 34.0 pg   MCHC 34.4 30.0 - 36.0 g/dL   RDW 62.9 52.8 - 41.3 %   Platelets 250 150 - 400 K/uL   Neutrophils Relative % 61 %   Neutro Abs 4.0 1.7 - 7.7 K/uL   Lymphocytes Relative 30 %   Lymphs Abs 2.0 0.7 - 4.0 K/uL   Monocytes Relative 8 %   Monocytes Absolute 0.5 0.1 - 1.0 K/uL   Eosinophils Relative 1 %   Eosinophils Absolute 0.1 0.0 - 0.7 K/uL    Basophils Relative 0 %   Basophils Absolute 0.0 0.0 - 0.1 K/uL    Comment: Performed at [facility], [address] [place], [place] [address]    Blood Alcohol level:  Lab Results  Component Value Date   [facility] <5 01/14/2017   ETH <5 09/20/2016    Metabolic Disorder Labs: Lab Results  Component Value Date   HGBA1C 4.8 02/12/2017   MPG 91.06 02/12/2017   MPG 97 08/08/2016   No results found for: PROLACTIN Lab Results  Component Value Date   CHOL 146 02/13/2017   TRIG 85 02/13/2017   HDL 43 02/13/2017   CHOLHDL 3.4 02/13/2017   VLDL 17 02/13/2017   LDLCALC 86 02/13/2017   LDLCALC 102 (H) 08/08/2016    Physical Findings: AIMS:  , ,  ,  ,    CIWA:    COWS:     Musculoskeletal: Strength & Muscle Tone: within normal limits Gait & Station: normal Patient leans: N/A  Psychiatric Specialty Exam: Physical Exam  Nursing note and vitals reviewed. Constitutional: He is oriented to person, place, and time.  He appears well-developed and well-nourished.  Cardiovascular:  Tachycardic  Respiratory: Effort normal.  Musculoskeletal: Normal range of motion.  Neurological: He is alert and oriented to person, place, and time.  Skin: Skin is warm.    Review of Systems  Constitutional: Negative.   HENT: Negative.   Eyes: Negative.   Respiratory: Negative.   Cardiovascular: Negative.   Gastrointestinal: Negative.   Genitourinary: Negative.   Musculoskeletal: Negative.   Skin: Negative.   Neurological: Negative.   Endo/Heme/Allergies: Negative.     Blood pressure 96/65, pulse (!) 111, temperature 98.4 F (36.9 C), resp. rate 18, height  (1.854 m), weight 99.8 kg (220 lb), SpO2 100 %.Body mass index is 29.03 kg/m.  General Appearance: Casual  Eye Contact:  Good  Speech:  Clear and Coherent and Normal Rate  Volume:  Normal  Mood:  Euthymic  Affect:  Appropriate  Thought Process:  Goal Directed and Descriptions of Associations: Intact  Orientation:   Full (Time, Place, and Person)  Thought Content:  WDL  Suicidal Thoughts:  No  Homicidal Thoughts:  No  Memory:  Immediate;   Good Recent;   Good Remote;   Good  Judgement:  Good  Insight:  Good  Psychomotor Activity:  Normal  Concentration:  Concentration: Good and Attention Span: Good  Recall:  Good  Fund of Knowledge:  Good  Language:  Good  Akathisia:  No  Handed:  Right  AIMS (if indicated):     Assets:  Desire for Improvement Financial Resources/Insurance Housing Social Support Transportation  ADL's:  Intact  Cognition:  WNL  Sleep:  Number of Hours: 6.75     Treatment Plan Summary: Daily contact with patient to assess and evaluate symptoms and progress in treatment, Medication management and Plan is to:  -Continue Gabapentin 200 mg PO QHS for restless leg syndrome -Increase Seroquel 200 mg PO QHS for mood stability -Continue Seroquel 25 mg PO BID for mood stability -Continue Zoloft 25 mg PO Daily for mood stability -Continue Trazodone 50 mg PO QHS PRN for insomnia -Continue Vistaril 25 mg PO TID PRN for anxiety -Encourage group therapy participation   Maryfrances Bunnell, FNP 02/15/2017, 2:32 PM   Agree with NP Progress Note

## 2017-02-15 NOTE — BHH Group Notes (Signed)
[facility] Mental Health Association Group Therapy 02/15/2017 1:15pm  Type of Therapy: Mental Health Association Presentation  Participation Level: Active  Participation Quality: Attentive  Affect: Appropriate  Cognitive: Oriented  Insight: Developing/Improving  Engagement in Therapy: Engaged  Modes of Intervention: Discussion, Education and Socialization  Summary of Progress/Problems: Mental Health Association (MHA) Speaker came to talk about his personal journey with living with a mental health diagnosis. The pt processed ways by which to relate to the speaker. MHA speaker provided handouts and educational information pertaining to groups and services offered by the [facility]. Pt was engaged in speaker's presentation and was receptive to resources provided.    Jonathon Jordan, MSW, LCSWA 02/15/2017 5:10 PM

## 2017-02-15 NOTE — Progress Notes (Signed)
Pt attended wrap-up group and participated in a group activity.  

## 2017-02-15 NOTE — Progress Notes (Signed)
Recreation Therapy Notes  Date: 02/15/17 Time: 0930 Location: [address] Dayroom  Group Topic: Stress Management  Goal Area(s) Addresses:  Patient will verbalize importance of using healthy stress management.  Patient will identify positive emotions associated with healthy stress management.   Intervention: Stress Management  Activity :  Meditation.  LRT introduced the stress management technique of meditation.  LRT played a meditation from the Calm app that allowed patients the opportunity to take inventory of any feelings or sensations they were feeling throughout their bodies.  Patients were to listen and follow along as the meditation played.  Education:  Stress Management, Discharge Planning.   Education Outcome: Acknowledges edcuation/In group clarification offered/Needs additional education  Clinical Observations/Feedback: Pt did not attend group.   Caroll Rancher, LRT/CTRS         Caroll Rancher A 02/15/2017 11:36 AM

## 2017-02-15 NOTE — Progress Notes (Signed)
[name] had been up and visible in milieu this evening, did attend and participate in evening group activity. He was seen interacting appropriately with peers in milieu. He did endorse anxiety, body aches and insomnia and spoke about how he is coming off methampetamine and heroin and spoke about how he has been involved in that lifestyle since he was 11 and spoke about how he wants to get into a treatment program when he gets discharged. He spoke about being hospitalized before for drug issues and spoke that he was not ready then but spoke about how he feels ready to put the drugs behind him. [name] was able to receive all bedtime medications without incident and was cooperative throughout the evening. A. Support ane encouragement provided. R. Safety maintained, will continue to monitor.

## 2017-02-16 MED ORDER — TRAZODONE HCL 50 MG PO TABS: 50.0000 mg | ORAL_TABLET | Freq: Every evening | ORAL | 0 refills | Status: DC | PRN

## 2017-02-16 MED ORDER — QUETIAPINE FUMARATE 200 MG PO TABS: 200.0000 mg | ORAL_TABLET | Freq: Every day | ORAL | 0 refills | Status: DC

## 2017-02-16 MED ORDER — HYDROXYZINE HCL 25 MG PO TABS: 25.0000 mg | ORAL_TABLET | Freq: Three times a day (TID) | ORAL | 0 refills | Status: DC | PRN

## 2017-02-16 MED ORDER — QUETIAPINE FUMARATE 25 MG PO TABS: 25.0000 mg | ORAL_TABLET | Freq: Two times a day (BID) | ORAL | 0 refills | Status: DC

## 2017-02-16 MED ORDER — SERTRALINE HCL 25 MG PO TABS: 25.0000 mg | ORAL_TABLET | Freq: Every day | ORAL | 0 refills | Status: DC

## 2017-02-16 MED ORDER — GABAPENTIN 100 MG PO CAPS: 200.0000 mg | ORAL_CAPSULE | Freq: Every day | ORAL | 0 refills | Status: DC

## 2017-02-16 NOTE — BHH Suicide Risk Assessment (Addendum)
[facility] Discharge Suicide Risk Assessment   Principal Problem: Bipolar 1 disorder [facility]) Discharge Diagnoses:  Patient Active Problem List   Diagnosis Date Noted  . Bipolar 1 disorder (HCC) [F31.9] 02/12/2017  . Polysubstance abuse (HCC) [F19.10]   . Recurrent major depression-severe (HCC) [F33.2] 08/07/2016  . Substance induced mood disorder (HCC) [F19.94]   . Atypical depression [F32.89]   . Polysubstance (including opioids) dependence with physiol dependence (HCC) [F19.20] 08/23/2014  . Sigmoid diverticulitis s/p lap assisted sigmoid colectomy 07/01/14 [K57.32] 07/01/2014  . Diverticulitis [K57.92] 12/06/2013  . Diverticulitis of large intestine without perforation or abscess without bleeding [K57.32] 12/06/2013  . Obesity, unspecified [E66.9] 12/06/2013  . Acute diverticulitis [K57.92] 12/06/2013  . NAUSEA AND VOMITING [R11.2] 02/09/2009  . OTHER SYMPTOMS INVOLVING DIGESTIVE SYSTEM OTHER [R19.8] 02/09/2009    Total Time spent with patient: 30 minutes  Musculoskeletal: Strength & Muscle Tone: within normal limits Gait & Station: normal Patient leans: N/A  Psychiatric Specialty Exam: ROS no headache, no chest pain, no abdominal pain, no vomiting, no diarrhea, no fever  Blood pressure 113/72, pulse (!) 104, temperature 98 F (36.7 C), temperature source Oral, resp. rate 16, height  (1.854 m), weight 99.8 kg (220 lb), SpO2 100 %.Body mass index is 29.03 kg/m.  General Appearance: Well Groomed  Eye Contact::  Good  Speech:  Normal Rate409  Volume:  Normal  Mood:  improving mood, states he feels better , denies feeling depressed at this time  Affect:  Appropriate and Full Range  Thought Process:  Linear and Descriptions of Associations: Intact  Orientation:  Full (Time, Place, and Person)  Thought Content:  no hallucinations, no delusions, not internally preoccupied   Suicidal Thoughts:  No denies suicidal or self injurious ideations, denies homicidal or violent ideations   Homicidal Thoughts:  No  Memory:  recent and remote grossly intact   Judgement:  Other:  improving   Insight:  improving   Psychomotor Activity:  Normal  Concentration:  Good  Recall:  Good  Fund of Knowledge:Good  Language: Good  Akathisia:  No  Handed:  Right  AIMS (if indicated):     Assets:  Communication Skills Desire for Improvement Resilience  Sleep:  Number of Hours: 6.25  Cognition: WNL  ADL's:  Intact   Mental Status Per Nursing Assessment::   On Admission:  Suicidal ideation indicated by patient  Demographic Factors:  26 year old single male, no children, currently unemployed, was living with aunt prior to admission  Loss Factors: Unemployment , history of depression, relapse  Historical Factors: Has been diagnosed with Bipolar Disorder in the past, history of Substance Abuse- opiates, methamphetamine   Risk Reduction Factors:   Positive coping skills or problem solving skills  Continued Clinical Symptoms:  Patient reports he is feeling much better than when he was admitted and that this is the best he has felt " in a while" Denies suicidal ideations, no homicidal ideations, no psychotic symptoms. Denies medication side effects. Behavior on unit calm and in good control, pleasant on approach    Cognitive Features That Contribute To Risk:  No gross cognitive deficits noted upon discharge. Is alert , attentive, and oriented x 3    Suicide Risk:  Mild:  Suicidal ideation of limited frequency, intensity, duration, and specificity.  There are no identifiable plans, no associated intent, mild dysphoria and related symptoms, good self-control (both objective and subjective assessment), few other risk factors, and identifiable protective factors, including available and accessible social support.  Follow-up  Information    [facility] Follow up on 02/16/2017.   Why:  Please arrive for scheduled admission at 12:00pm.   Contact information: [address] [place] [address] [phone]           Plan Of Care/Follow-up recommendations:  Activity:  as tolerated  Diet:  regular Tests:  NA Other:  see below  Patient is being discharged in good spirits, plans to go to [facility] rehab program Of note, patient has history of Hep C (+) and recent elevated transaminases- encouraged to follow up at [facility] for medical monitoring and management of medical issues . Craige Cotta, MD 02/16/2017, 9:30 AM

## 2017-02-16 NOTE — Progress Notes (Signed)
Discharge note:  Patient discharged per MD order.  Patient is to be picked up by mother and she will transport patient to [facility].  His appointment is at 1200 today.  Patient denies any thoughts of self harm.  Reviewed AVS/transition record with patient and he indicated understanding.  Patient left with prescriptions.  Patient received all personal belongings from unit and locker.  He left ambulatory with his father.

## 2017-02-16 NOTE — Discharge Summary (Signed)
Physician Discharge Summary Note  Patient:  [name] is an 26 y.o., male MRN:  [id number] DOB:  [birth year] Patient phone:  [phone] (home)  Patient address:   [address] [place] [address],  Total Time spent with patient: 20 minutes  Date of Admission:  02/12/2017 Date of Discharge: 02/17/17   Reason for Admission:  Worsening depression and SI  Principal Problem: Bipolar 1 disorder [facility]) Discharge Diagnoses: Patient Active Problem List   Diagnosis Date Noted  . Bipolar 1 disorder (HCC) [F31.9] 02/12/2017  . Polysubstance abuse (HCC) [F19.10]   . Recurrent major depression-severe (HCC) [F33.2] 08/07/2016  . Substance induced mood disorder (HCC) [F19.94]   . Atypical depression [F32.89]   . Polysubstance (including opioids) dependence with physiol dependence (HCC) [F19.20] 08/23/2014  . Sigmoid diverticulitis s/p lap assisted sigmoid colectomy 07/01/14 [K57.32] 07/01/2014  . Diverticulitis [K57.92] 12/06/2013  . Diverticulitis of large intestine without perforation or abscess without bleeding [K57.32] 12/06/2013  . Obesity, unspecified [E66.9] 12/06/2013  . Acute diverticulitis [K57.92] 12/06/2013  . NAUSEA AND VOMITING [R11.2] 02/09/2009  . OTHER SYMPTOMS INVOLVING DIGESTIVE SYSTEM OTHER [R19.8] 02/09/2009    Past Psychiatric History: Opiate abuse, Amphetamine abuse, Cocaine abuse, Bipolar, MDD  Past Medical History:  Past Medical History:  Diagnosis Date  . Anxiety   . Attention deficit disorder   . Depression   . Diverticulitis   . Hiccups    pt states hiccups during sleep     Past Surgical History:  Procedure Laterality Date  . colonscopy      removed polyps   . LAPAROSCOPIC PARTIAL COLECTOMY N/A 07/01/2014   Procedure: LAPAROSCOPIC ASSISTED SIGMOID COLECTOMY WITH MOBILIZATION OF SPLENIC FLEXURE;  Surgeon: Avel Peace, MD;  Location: [facility];  Service: General;  Laterality: N/A;   Family History: History reviewed. No pertinent family  history. Family Psychiatric  History: Unknown Social History:  History  Alcohol Use  . Yes    Comment: occasionally     History  Drug Use  . Types: "Crack" cocaine, Heroin, Methamphetamines, Marijuana, Cocaine    Comment: clean since 11/30/15    Social History   Social History  . Marital status: Single    Spouse name: N/A  . Number of children: N/A  . Years of education: N/A   Social History Main Topics  . Smoking status: Never Smoker  . Smokeless tobacco: Current User    Types: Chew  . Alcohol use Yes     Comment: occasionally  . Drug use: Yes    Types: "Crack" cocaine, Heroin, Methamphetamines, Marijuana, Cocaine     Comment: clean since 11/30/15  . Sexual activity: Yes    Birth control/ protection: Condom   Other Topics Concern  . None   Social History Narrative  . None    Hospital Course:   26 y.o.malewho presented [facility] on his own requesting assistance with his depression and SA issues. He states that he was just here a couple weeks ago due to an intentional overdose in an attempt to kill himself and states that he was dischagred to follow-up at [facility] which he states that he did. He reportsthat he was restarted on medications, but states that his mood continues to worsen and he states that he is depressed, feels hopeless and helpless. Patientstates that he he is having thoughts of overdosing again. Hestates that he has been trying to help himself by trying to stay clean and states that he has not used heroin in three days and methamphetamine in the  past five days. Patient states that he is currently homeless and recently lost his job which has made things worse for him. Patient is alert an oriented, he is somewhat anxious and has a depressed mood. He states that he is not eating and has weight loss of 30-40 lbs in the past few months. He states that he is only sleeping 2-3 hours per night. He states that he has been hearing a voice inside his head telling  him to self-sabotage and states that hehas been seeing shadows. Patient states that he has been using heroin dailysince the age of 32 and states that he has been using meth-ampheatamines daily for the past two years. He states that he completed eight months of treatment in a long term program and states that he was able to stay clean for four months after completing the program, but subsequently relapsed. He states that he would like to get his moods stabilized and be referred to a long term program because he states that if he does not get the help he needs that he is going to die.  Patient's chart and findings discussed and reviewed with treatment team. Patient is seen today and details above information. He appears anxious and fidgety during interview. Patient denies any opiate withdrawal symptoms and last use was 6 days ago. Patient wishes to restart medications from last admission and reports that his psychiatrist stopped his Zoloft and put him back on Celexa and he liked the Zoloft better. Discussed with Dr. Jama Flavors and he will be ordering the Seroquel and Zoloft again. Patient is requesting a long term rehab facility and states he was looking at one taht was 2 years and states he would be gone that long, "This is my last chance."  Today patient has been on the [facility] unit 4 days and has stabilized and improved and has been accepted to [facility]. Patient stabilized on medications and group therapy. He was started on Seroquel, Neurontin, Zoloft scheduled and Trazodone and Vistaril PRN. Patient showed improvement with improved sleep, improved appetite, no withdrawal symptoms, improved moved, continuous denial of SI/HI/AVH and improvement in depression and anxiety. Patient attended group therapy and interacted with staff and others appropriately. Patient was discharged to his mother and his mother was transporting him to [facility]. Patient contracted for safety. Patient was provided with prescriptions for his  medications upon discharge.  Physical Findings: AIMS:  , ,  ,  ,    CIWA:    COWS:     Musculoskeletal: Strength & Muscle Tone: within normal limits Gait & Station: normal Patient leans: N/A  Psychiatric Specialty Exam: Physical Exam  Nursing note and vitals reviewed. Constitutional: He is oriented to person, place, and time. He appears well-developed and well-nourished.  Cardiovascular:  tachycardic  Respiratory: Effort normal.  Musculoskeletal: Normal range of motion.  Neurological: He is alert and oriented to person, place, and time.  Skin: Skin is warm.    Review of Systems  Constitutional: Negative.   HENT: Negative.   Eyes: Negative.   Respiratory: Negative.   Cardiovascular: Negative.   Gastrointestinal: Negative.   Genitourinary: Negative.   Musculoskeletal: Negative.   Skin: Negative.   Neurological: Negative.   Endo/Heme/Allergies: Negative.     Blood pressure 113/72, pulse (!) 104, temperature 98 F (36.7 C), temperature source Oral, resp. rate 16, height  (1.854 m), weight 99.8 kg (220 lb), SpO2 100 %.Body mass index is 29.03 kg/m.  General Appearance: Casual  Eye Contact:  Good  Speech:  Clear and Coherent and Normal Rate  Volume:  Normal  Mood:  Euthymic  Affect:  Appropriate  Thought Process:  Goal Directed and Descriptions of Associations: Intact  Orientation:  Full (Time, Place, and Person)  Thought Content:  WDL  Suicidal Thoughts:  No  Homicidal Thoughts:  No  Memory:  Immediate;   Good Recent;   Good Remote;   Good  Judgement:  Good  Insight:  Good  Psychomotor Activity:  Normal  Concentration:  Concentration: Good and Attention Span: Good  Recall:  Good  Fund of Knowledge:  Good  Language:  Good  Akathisia:  No  Handed:  Right  AIMS (if indicated):     Assets:  Desire for Improvement Financial Resources/Insurance Housing Physical Health Social Support Transportation  ADL's:  Intact  Cognition:  WNL  Sleep:  Number of  Hours: 6.25     Have you used any form of tobacco in the last 30 days? (Cigarettes, Smokeless Tobacco, Cigars, and/or Pipes): Yes  Has this patient used any form of tobacco in the last 30 days? (Cigarettes, Smokeless Tobacco, Cigars, and/or Pipes) Yes, Yes, A prescription for an FDA-approved tobacco cessation medication was offered at discharge and the patient refused  Blood Alcohol level:  Lab Results  Component Value Date   [facility] <5 01/14/2017   ETH <5 09/20/2016    Metabolic Disorder Labs:  Lab Results  Component Value Date   HGBA1C 4.8 02/12/2017   MPG 91.06 02/12/2017   MPG 97 08/08/2016   No results found for: PROLACTIN Lab Results  Component Value Date   CHOL 146 02/13/2017   TRIG 85 02/13/2017   HDL 43 02/13/2017   CHOLHDL 3.4 02/13/2017   VLDL 17 02/13/2017   LDLCALC 86 02/13/2017   LDLCALC 102 (H) 08/08/2016    See Psychiatric Specialty Exam and Suicide Risk Assessment completed by Attending Physician prior to discharge.  Discharge destination:  [facility]  Is patient on multiple antipsychotic therapies at discharge:  No   Has Patient had three or more failed trials of antipsychotic monotherapy by history:  No  Recommended Plan for Multiple Antipsychotic Therapies: NA   Allergies as of 02/16/2017      Reactions   Cephalexin Anaphylaxis      Medication List    STOP taking these medications   citalopram 20 MG tablet Commonly known as:  CELEXA     TAKE these medications     Indication  gabapentin 100 MG capsule Commonly known as:  NEURONTIN Take 2 capsules (200 mg total) by mouth at bedtime. For restless leg  Indication:  Restless Leg Syndrome   hydrOXYzine 25 MG tablet Commonly known as:  ATARAX/VISTARIL Take 1 tablet (25 mg total) by mouth 3 (three) times daily as needed for anxiety.  Indication:  Feeling Anxious   QUEtiapine 25 MG tablet Commonly known as:  SEROQUEL Take 1 tablet (25 mg total) by mouth 2 (two) times daily. For mood control What  changed:  medication strength  how much to take  when to take this  additional instructions  Indication:  mood stability   QUEtiapine 200 MG tablet Commonly known as:  SEROQUEL Take 1 tablet (200 mg total) by mouth at bedtime. For mood control What changed:  You were already taking a medication with the same name, and this prescription was added. Make sure you understand how and when to take each.  Indication:  Manic Phase of Manic-Depression, mood stability   sertraline 25 MG tablet Commonly known as:  ZOLOFT Take 1 tablet (25 mg total) by mouth daily. For mood control What changed:  medication strength  how much to take  additional instructions  Indication:  mood stability   traZODone 50 MG tablet Commonly known as:  DESYREL Take 1 tablet (50 mg total) by mouth at bedtime as needed for sleep. What changed:  medication strength  how much to take  when to take this  reasons to take this  Indication:  Trouble Sleeping      Follow-up Information    [facility] Follow up on 02/16/2017.   Why:  Please arrive for scheduled admission at 12:00pm.  Contact information: [address] [place] [address] [phone]           Follow-up recommendations:  Continue activity as tolerated. Continue diet as recommended by your PCP. Ensure to keep all appointments with outpatient providers.  Comments:  Patient is instructed prior to discharge to: Take all medications as prescribed by his/her mental healthcare provider. Report any adverse effects and or reactions from the medicines to his/her outpatient provider promptly. Patient has been instructed & cautioned: To not engage in alcohol and or illegal drug use while on prescription medicines. In the event of worsening symptoms, patient is instructed to call the crisis hotline, 911 and or go to the nearest ED for appropriate evaluation and treatment of symptoms. To follow-up with  his/her primary care provider for your other medical issues, concerns and or health care needs.    Signed: Gerlene Burdock Money, FNP 02/17/2017, 8:01 AM   Patient seen, Suicide Assessment Completed.  Disposition Plan Reviewed

## 2017-02-16 NOTE — Progress Notes (Signed)
  [facility] Adult Case Management Discharge Plan :  Will you be returning to the same living situation after discharge:  No. Pt going to [facility] for continued treatment At discharge, do you have transportation home?: Yes,  Pt family to transport Do you have the ability to pay for your medications: Yes,  Pt provided with samples and prescriptions  Release of information consent forms completed and in the chart;  Patient's signature needed at discharge.  Patient to Follow up at: Follow-up Information    [facility] Follow up on 02/16/2017.   Why:  Please arrive for scheduled admission at 12:00pm.  Contact information: [address] [place] [address] [phone]           Next level of care provider has access to [facility] Link:no  Safety Planning and Suicide Prevention discussed: Yes,  with Pt; declined family contact  Have you used any form of tobacco in the last 30 days? (Cigarettes, Smokeless Tobacco, Cigars, and/or Pipes): Yes  Has patient been referred to the Quitline?: Patient refused referral  Patient has been referred for addiction treatment: Yes  Verdene Lennert, LCSW 02/16/2017, 9:38 AM

## 2017-12-29 IMAGING — US US ABDOMEN COMPLETE
1 series · 13 of 25 positions shown · non-contrast
Comparison: Abdominal and pelvic CT scan December 15, 2015 and December 06, 2013

CLINICAL DATA: Transaminitis

EXAM:
ABDOMEN ULTRASOUND COMPLETE

[Series 1: us abdomen complete · 0.22mm/px · 13 of 105 slices shown]
[im 1/105]
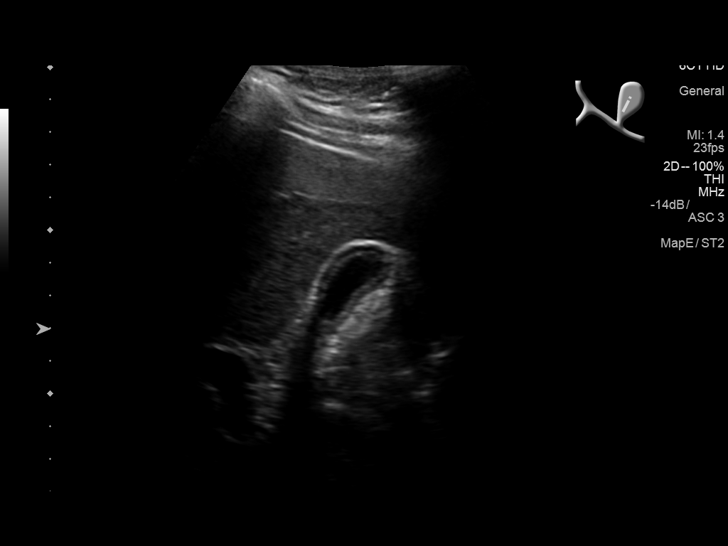
[im 9/105]
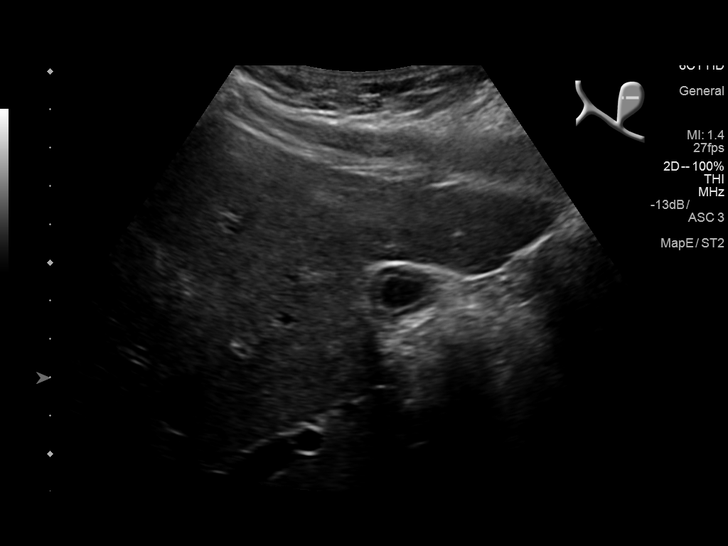
[im 18/105]
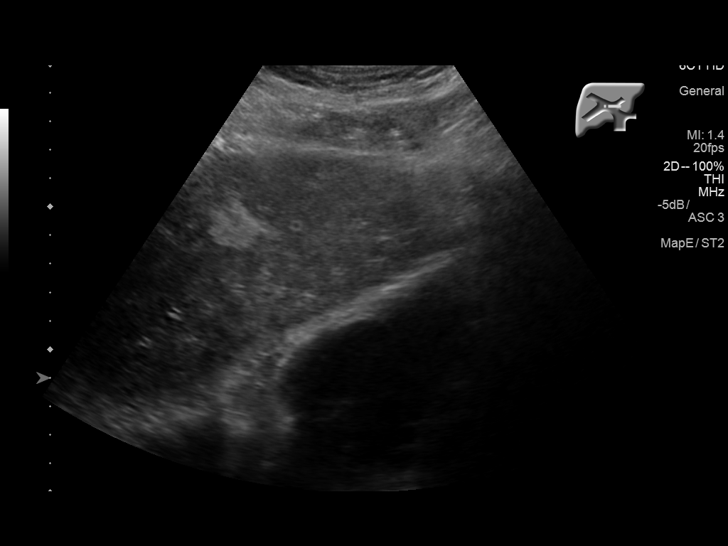
[im 27/105]
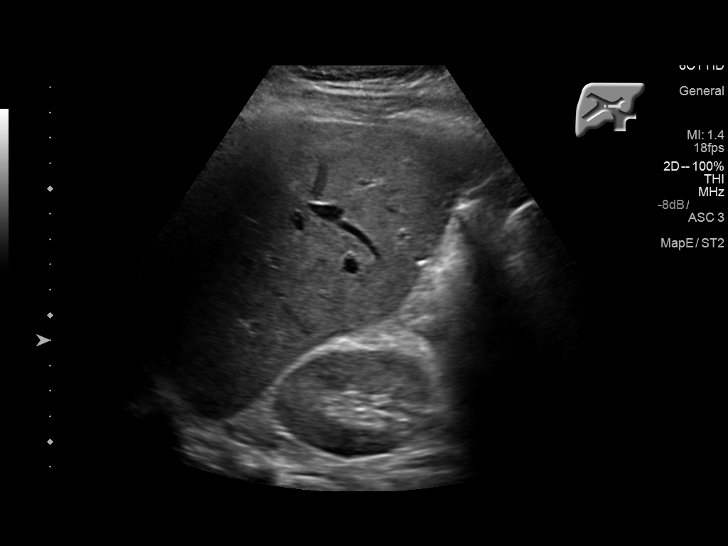
[im 35/105]
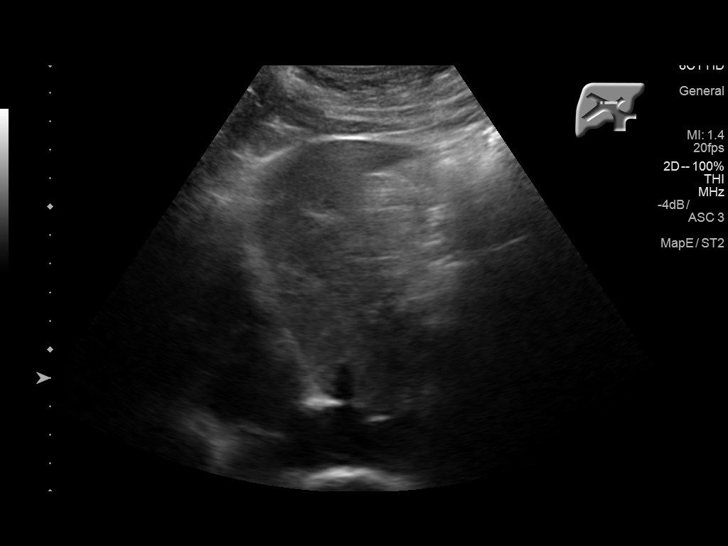
[im 44/105]
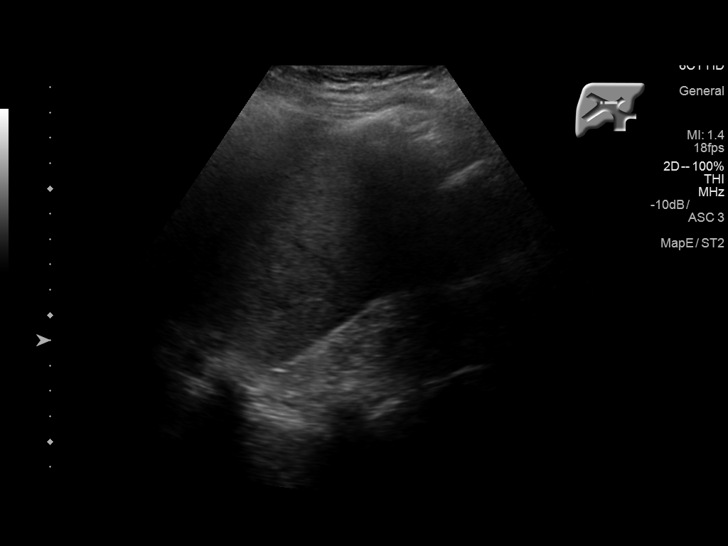
[im 53/105]
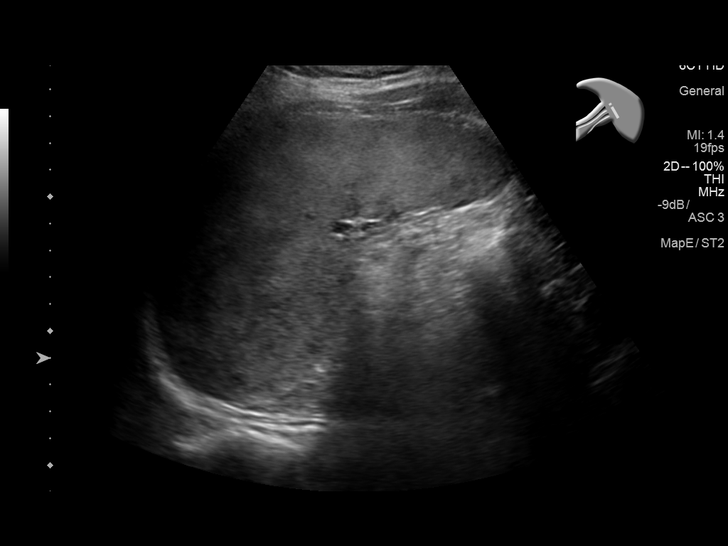
[im 61/105]
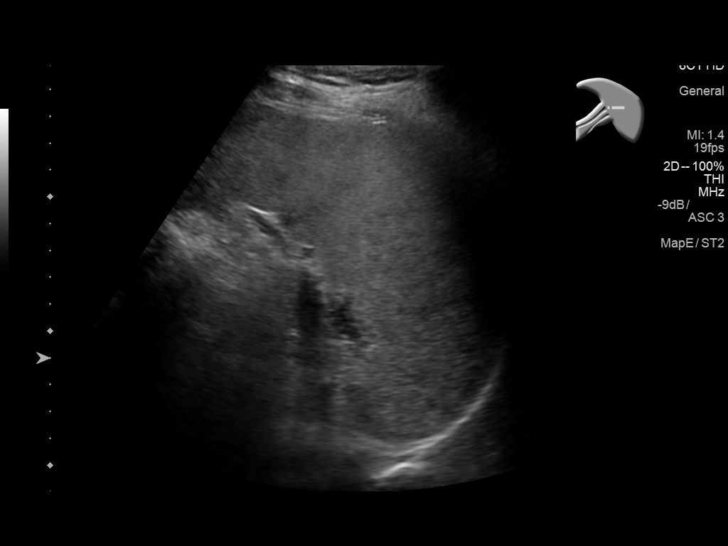
[im 70/105]
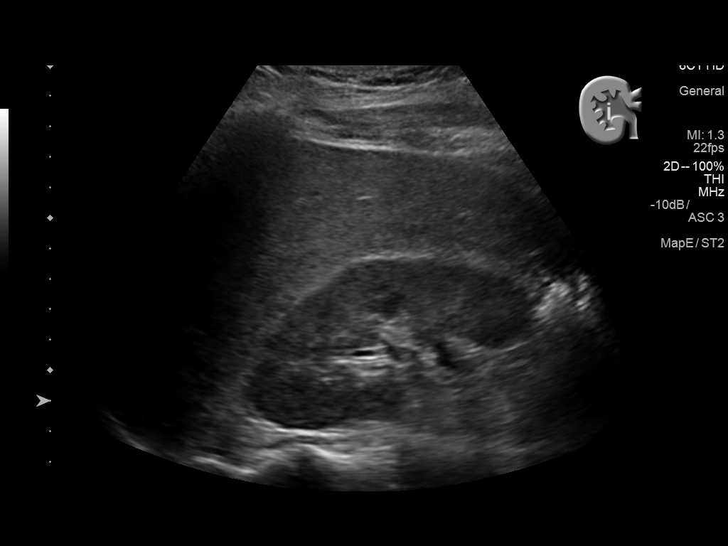
[im 79/105]
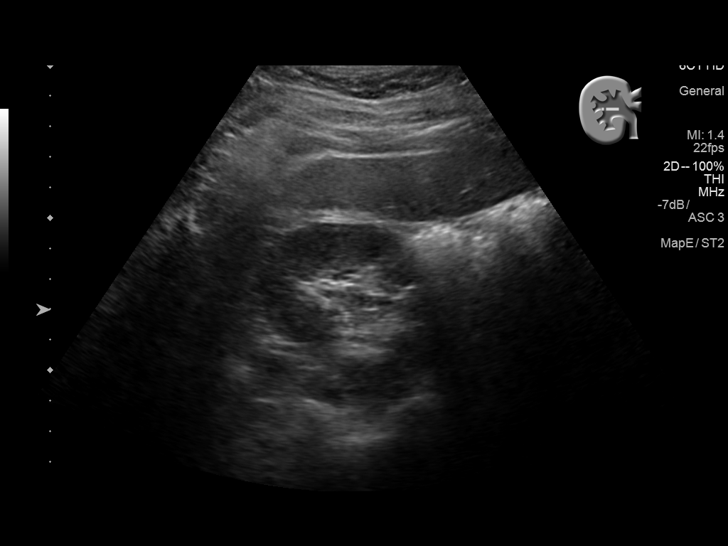
[im 87/105]
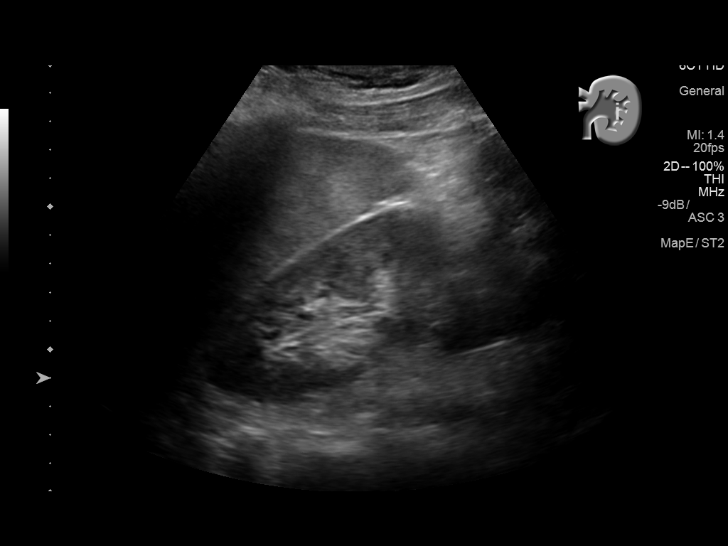
[im 96/105]
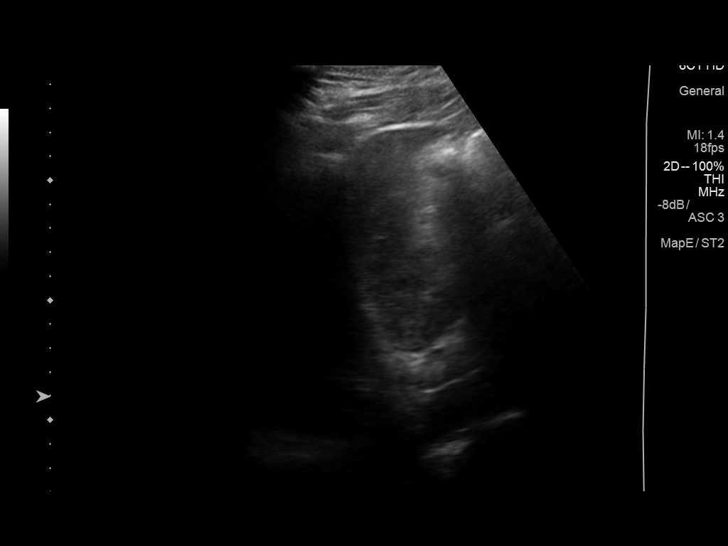
[im 105/105]
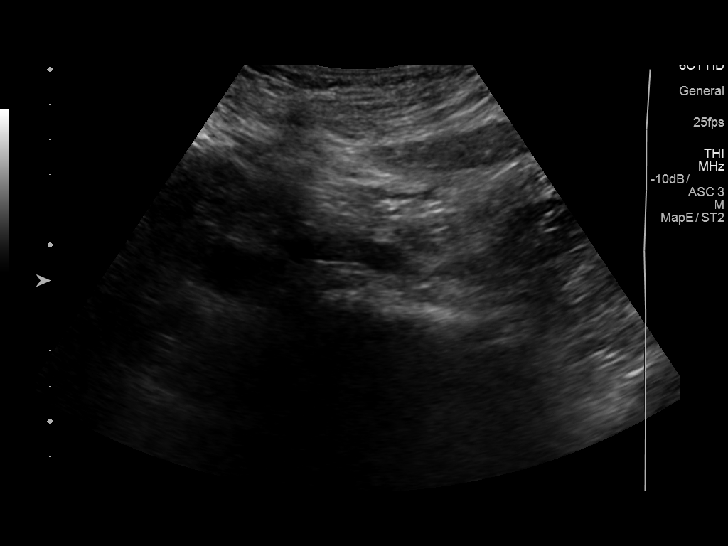

[13 of 25 positions shown; findings below may reference images not displayed]

FINDINGS: Gallbladder: The gallbladder is mildly contracted. No stones or
sludge or Murphy sign are observed. There is no gallbladder wall
thickening or pericholecystic fluid.

Common bile duct: Diameter: 2.8 mm

Liver: The hepatic echotexture appears normal. There is a
hyperechoic region measuring 3.8 x 1.7 x 2.6 cm which may correspond
to a presumed hemangioma on the December 2015 and November 2013 CT scans.
There is no intrahepatic ductal dilation. The surface contour of the
liver is normal. Portal vein is patent on color Doppler imaging with
normal direction of blood flow towards the liver.

IVC: No abnormality visualized.

Pancreas: Bowel gas obscures the pancreas.

Spleen: 13.7 x 7.0 x 13.9 cm with a calculated volume of 697 cc.

Right Kidney: Length: 10.8 cm. Echogenicity within normal limits. No
mass or hydronephrosis visualized.

Left Kidney: Length: 11 cm.. Echogenicity within normal limits. No
mass or hydronephrosis visualized.

Abdominal aorta: There is limited visualization of the mid abdominal
aorta. No aneurysm is observed.

Other findings: There is no ascites.
IMPRESSION: Mildly contracted gallbladder. No stones, sludge, or sonographic
evidence of acute cholecystitis.

Probable hemangioma in the left hepatic lobe. No acute hepatic
abnormality is observed. If the transaminitis persists however, MRI
of the liver would be a useful next imaging step.

Splenomegaly of uncertain etiology.

## 2018-04-21 ENCOUNTER — Encounter (HOSPITAL_COMMUNITY): Payer: Self-pay | Admitting: Clinical

## 2018-04-21 ENCOUNTER — Emergency Department (HOSPITAL_COMMUNITY): Payer: Self-pay

## 2018-04-21 ENCOUNTER — Emergency Department (HOSPITAL_COMMUNITY)
Admission: EM | Admit: 2018-04-21 | Discharge: 2018-04-21 | Disposition: A | Discharge status: Home / Self Care | Payer: Self-pay | Attending: Emergency Medicine | Admitting: Emergency Medicine

## 2018-04-21 ENCOUNTER — Other Ambulatory Visit: Payer: Self-pay

## 2018-04-21 ENCOUNTER — Ambulatory Visit (HOSPITAL_COMMUNITY)
Admission: RE | Admit: 2018-04-21 | Discharge: 2018-04-21 | Disposition: A | Discharge status: Home / Self Care | Payer: Self-pay | Attending: Psychiatry | Admitting: Psychiatry

## 2018-04-21 DIAGNOSIS — F17220 Nicotine dependence, chewing tobacco, uncomplicated: Secondary | ICD-10-CM | POA: Insufficient documentation

## 2018-04-21 DIAGNOSIS — F1120 Opioid dependence, uncomplicated: Secondary | ICD-10-CM | POA: Insufficient documentation

## 2018-04-21 DIAGNOSIS — Z79899 Other long term (current) drug therapy: Secondary | ICD-10-CM | POA: Insufficient documentation

## 2018-04-21 DIAGNOSIS — W01198A Fall on same level from slipping, tripping and stumbling with subsequent striking against other object, initial encounter: Secondary | ICD-10-CM | POA: Insufficient documentation

## 2018-04-21 DIAGNOSIS — T50901A Poisoning by unspecified drugs, medicaments and biological substances, accidental (unintentional), initial encounter: Secondary | ICD-10-CM | POA: Insufficient documentation

## 2018-04-21 DIAGNOSIS — J189 Pneumonia, unspecified organism: Secondary | ICD-10-CM | POA: Insufficient documentation

## 2018-04-21 DIAGNOSIS — F988 Other specified behavioral and emotional disorders with onset usually occurring in childhood and adolescence: Secondary | ICD-10-CM | POA: Insufficient documentation

## 2018-04-21 DIAGNOSIS — F419 Anxiety disorder, unspecified: Secondary | ICD-10-CM | POA: Insufficient documentation

## 2018-04-21 LAB — CBG MONITORING, ED: Glucose-Capillary: 196 mg/dL — ABNORMAL HIGH (ref 70–99)

## 2018-04-21 MED ORDER — DOXYCYCLINE HYCLATE 100 MG PO CAPS: 100.0000 mg | ORAL_CAPSULE | Freq: Two times a day (BID) | ORAL | 0 refills | Status: DC

## 2018-04-21 NOTE — H&P (Signed)
Behavioral Health Medical Screening Exam  [name] is an 27 y.o. male. [facility] was wanting him admitted for his one time relapse on heroin. He denies having any suicidal or homicidal thoughts and reports that his family was pushing him to be admitted. His brother states that they did not believe the patient because of his history. The patient [facility] and call his NA sponsor.  Total Time spent with patient: 20 minutes  Psychiatric Specialty Exam: Physical Exam  Nursing note and vitals reviewed. Constitutional: He is oriented to person, place, and time. He appears well-developed and well-nourished.  Cardiovascular: Normal rate.  Respiratory: Effort normal.  Musculoskeletal: Normal range of motion.  Neurological: He is alert and oriented to person, place, and time.  Skin: Skin is warm.    Review of Systems  Constitutional: Negative.   HENT: Negative.   Eyes: Negative.   Respiratory: Negative.   Cardiovascular: Negative.   Gastrointestinal: Negative.   Genitourinary: Negative.   Musculoskeletal: Negative.   Skin: Negative.   Neurological: Negative.   Endo/Heme/Allergies: Negative.   Psychiatric/Behavioral: Positive for depression and substance abuse. Negative for hallucinations and suicidal ideas. The patient is nervous/anxious.     Blood pressure 124/63, pulse 93, temperature 98 F (36.7 C).There is no height or weight on file to calculate BMI.  General Appearance: Casual  Eye Contact:  Good  Speech:  Clear and Coherent and Normal Rate  Volume:  Normal  Mood:  Anxious  Affect:  Congruent  Thought Process:  Coherent and Descriptions of Associations: Intact  Orientation:  Full (Time, Place, and Person)  Thought Content:  WDL  Suicidal Thoughts:  No  Homicidal Thoughts:  No  Memory:  Immediate;   Good Recent;   Good Remote;   Good  Judgement:  Fair  Insight:  Lacking  Psychomotor Activity:   Normal  Concentration: Concentration: Good and Attention Span: Good  Recall:  Good  Fund of Knowledge:Good  Language: Good  Akathisia:  No  Handed:  Right  AIMS (if indicated):     Assets:  Communication Skills Desire for Improvement Housing Physical Health Resilience Social Support Transportation  Sleep:       Musculoskeletal: Strength & Muscle Tone: within normal limits Gait & Station: normal Patient leans: N/A  Blood pressure 124/63, pulse 93, temperature 98 F (36.7 C).  Recommendations:  Based on my evaluation the patient does not appear to have an emergency medical condition.  Gerlene Burdockravis B Money, FNP 04/21/2018, 1:58 PM

## 2018-04-21 NOTE — BH Assessment (Addendum)
Assessment Note  [name] is an 27 y.o. male presenting voluntarily to [facility] for assessment. Patient is accompanied by his brother, Reita Cliche, who is present for assessment. Patient stated he relapsed on heroin today after 4 months of being clean. Patient accessed [facility] ED this morning after receiving Narcan. Patient denies any other substance use,  Patient initially stated that he was having passive suicidal thoughts without plan or intent. Patient was at [facility] in 2018 following an intentional overdose. Patient is seen at [facility] for medication management and outpatient therapy. When NP, [facility], came to talk to patient he admitted that he lied about being suicidal to have admission into [facility]. Patient currently lives with aunt and may return to her residence. Patient confirmed he could maintain his safety and contact his NA sponsor if necessary. Patient denies AVH as well . Patient stated he would follow up with his outpatient provider on Monday.  Diagnosis: F11.20 Opioid use disorder, moderate  Past Medical History:  Past Medical History:  Diagnosis Date  . Anxiety   . Attention deficit disorder   . Depression   . Diverticulitis   . Hiccups    pt states hiccups during sleep     Past Surgical History:  Procedure Laterality Date  . colonscopy      removed polyps   . LAPAROSCOPIC PARTIAL COLECTOMY N/A 07/01/2014   Procedure: LAPAROSCOPIC ASSISTED SIGMOID COLECTOMY WITH MOBILIZATION OF SPLENIC FLEXURE;  Surgeon: Avel Peace, MD;  Location: [facility] ORS;  Service: General;  Laterality: N/A;    Family History: History reviewed. No pertinent family history.  Social History:  reports that he has never smoked. His smokeless tobacco use includes chew. He reports that he drinks alcohol. He reports that he has current or past drug history. Drugs: "Crack" cocaine, Heroin, Methamphetamines, Marijuana, and Cocaine.  Additional Social History:  Alcohol / Drug Use Pain Medications: see  MAR Prescriptions: see MAR Over the Counter: see MAR History of alcohol / drug use?: Yes Longest period of sobriety (when/how long): 8 months Substance #1 Name of Substance 1: Heroin 1 - Age of First Use: 15 1 - Amount (size/oz): UTA 1 - Frequency: pt has been clean for 4 months 1 - Duration: 1 day 1 - Last Use / Amount: today 9 am, unknown amount  CIWA: CIWA-Ar BP: 124/63 Pulse Rate: 93 COWS:    Allergies:  Allergies  Allergen Reactions  . Cephalexin Anaphylaxis    Home Medications:  (Not in a hospital admission)  OB/GYN Status:  No LMP for male patient.  General Assessment Data Location of Assessment: [facility] Assessment Services TTS Assessment: In system Is this a Tele or Face-to-Face Assessment?: Face-to-Face Is this an Initial Assessment or a Re-assessment for this encounter?: Initial Assessment Patient Accompanied by:: Other(brother) Language Other than English: No Living Arrangements: (aunt) What gender do you identify as?: Male Marital status: Single Pregnancy Status: No Living Arrangements: Other relatives Can pt return to current living arrangement?: Yes Admission Status: Voluntary Is patient capable of signing voluntary admission?: Yes Referral Source: Self/Family/Friend Insurance type: Medicaid     Crisis Care Plan Living Arrangements: Other relatives     Risk to self with the past 6 months Suicidal Ideation: No Has patient been a risk to self within the past 6 months prior to admission? : No Suicidal Intent: No Has patient had any suicidal intent within the past 6 months prior to admission? : No Is patient at risk for suicide?: No Suicidal Plan?: No Has patient had  any suicidal plan within the past 6 months prior to admission? : No Access to Means: No What has been your use of drugs/alcohol within the last 12 months?: heroin Previous Attempts/Gestures: Yes How many times?: 1 Other Self Harm Risks: none Triggers for Past Attempts: Other  (Comment)(depression, anxiety, addiction) Intentional Self Injurious Behavior: None Family Suicide History: No Recent stressful life event(s): Other (Comment)(relapse) Persecutory voices/beliefs?: No Depression: Yes Depression Symptoms: Despondent, Tearfulness, Fatigue, Loss of interest in usual pleasures, Feeling worthless/self pity, Feeling angry/irritable Substance abuse history and/or treatment for substance abuse?: Yes Suicide prevention information given to non-admitted patients: Not applicable  Risk to Others within the past 6 months Homicidal Ideation: No Does patient have any lifetime risk of violence toward others beyond the six months prior to admission? : No Thoughts of Harm to Others: No Current Homicidal Intent: No Current Homicidal Plan: No Access to Homicidal Means: No Identified Victim: none History of harm to others?: No Assessment of Violence: None Noted Does patient have access to weapons?: No Criminal Charges Pending?: No Does patient have a court date: No Is patient on probation?: No  Psychosis Hallucinations: None noted Delusions: None noted  Mental Status Report Appearance/Hygiene: Disheveled Eye Contact: Good Motor Activity: Unsteady Speech: Slow, Slurred Level of Consciousness: Alert Mood: Depressed Affect: Depressed Anxiety Level: Moderate Thought Processes: Coherent, Relevant Judgement: Impaired Orientation: Person, Place, Time, Situation Obsessive Compulsive Thoughts/Behaviors: None  Cognitive Functioning Concentration: Normal Memory: Recent Intact, Remote Intact Is patient IDD: No Insight: Fair Impulse Control: Poor Appetite: Good Have you had any weight changes? : No Change Sleep: No Change Total Hours of Sleep: 8 Vegetative Symptoms: None  ADLScreening [facility] Assessment Services) Patient's cognitive ability adequate to safely complete daily activities?: Yes Patient able to express need for assistance with ADLs?: Yes Independently  performs ADLs?: Yes (appropriate for developmental age)  Prior Inpatient Therapy Prior Inpatient Therapy: Yes Prior Therapy Dates: 2018 Prior Therapy Facilty/Provider(s): [facility] Reason for Treatment: addiction  Prior Outpatient Therapy Prior Outpatient Therapy: Yes Prior Therapy Dates: ongoing Prior Therapy Facilty/Provider(s): [facility] Reason for Treatment: addiction Does patient have an ACCT team?: No Does patient have Intensive In-House Services?  : No Does patient have [facility] services? : Yes Does patient have P4CC services?: No  ADL Screening (condition at time of admission) Patient's cognitive ability adequate to safely complete daily activities?: Yes Is the patient deaf or have difficulty hearing?: No Does the patient have difficulty seeing, even when wearing glasses/contacts?: No Does the patient have difficulty concentrating, remembering, or making decisions?: No Patient able to express need for assistance with ADLs?: Yes Does the patient have difficulty dressing or bathing?: No Independently performs ADLs?: Yes (appropriate for developmental age) Does the patient have difficulty walking or climbing stairs?: No Weakness of Legs: None Weakness of Arms/Hands: None     Therapy Consults (therapy consults require a physician order) PT Evaluation Needed: No OT Evalulation Needed: No SLP Evaluation Needed: No Abuse/Neglect Assessment (Assessment to be complete while patient is alone) Abuse/Neglect Assessment Can Be Completed: Yes Physical Abuse: Denies, provider concerned (Comment)(pt reports experiencing "things" on the street) Verbal Abuse: Denies Sexual Abuse: Denies Exploitation of patient/patient's resources: Denies Self-Neglect: Denies Values / Beliefs Cultural Requests During Hospitalization: None Spiritual Requests During Hospitalization: None Consults Spiritual Care Consult Needed: No Social Work Consult Needed: No Merchant navy officer (For Healthcare) Does  Patient Have a Medical Advance Directive?: No Would patient like information on creating a medical advance directive?: Yes (ED - Information included in AVS)  Disposition: Per Reola Calkinsravis Money, NP patient does not meet in patient criteria. Disposition Initial Assessment Completed for this Encounter: Yes Disposition of Patient: Discharge Patient refused recommended treatment: No Mode of transportation if patient is discharged/movement?: Car Patient referred to: Outpatient clinic referral  On Site Evaluation by:   Reviewed with Physician:    Celedonio MiyamotoMeredith  Shauntea Lok 04/21/2018 2:06 PM

## 2018-04-21 NOTE — ED Provider Notes (Addendum)
[facility]-EMERGENCY DEPT Provider Note   CSN: [id number] Arrival date & time: 04/21/18  1017     History   Chief Complaint No chief complaint on file.   HPI [name] is a 27 y.o. male.  27 year old male who presents after becoming lightheaded and dizzy after using heroin.  Patient states he uses infrequently and that he was at his baseline until the event.  Larey Seat and struck his head against a sink in the bathroom.  Patient did not lose consciousness.  EMS called and patient given Narcan and patient back to his baseline.  Notes mild headache as well as dental pain.  Denies neck pain.  Denies being short of breath.  No other use of substances at this time including alcohol.  Blood sugar above 100 according to EMS     Past Medical History:  Diagnosis Date  . Anxiety   . Attention deficit disorder   . Depression   . Diverticulitis   . Hiccups    pt states hiccups during sleep     Patient Active Problem List   Diagnosis Date Noted  . Bipolar 1 disorder (HCC) 02/12/2017  . Polysubstance abuse (HCC)   . Recurrent major depression-severe (HCC) 08/07/2016  . Substance induced mood disorder (HCC)   . Atypical depression   . Polysubstance (including opioids) dependence with physiol dependence (HCC) 08/23/2014  . Sigmoid diverticulitis s/p lap assisted sigmoid colectomy 07/01/14 07/01/2014  . Diverticulitis 12/06/2013  . Diverticulitis of large intestine without perforation or abscess without bleeding 12/06/2013  . Obesity, unspecified 12/06/2013  . Acute diverticulitis 12/06/2013  . NAUSEA AND VOMITING 02/09/2009  . OTHER SYMPTOMS INVOLVING DIGESTIVE SYSTEM OTHER 02/09/2009    Past Surgical History:  Procedure Laterality Date  . colonscopy      removed polyps   . LAPAROSCOPIC PARTIAL COLECTOMY N/A 07/01/2014   Procedure: LAPAROSCOPIC ASSISTED SIGMOID COLECTOMY WITH MOBILIZATION OF SPLENIC FLEXURE;  Surgeon: Avel Peace, MD;  Location: [facility] ORS;   Service: General;  Laterality: N/A;        Home Medications    Prior to Admission medications   Medication Sig Start Date End Date Taking? Authorizing Provider  gabapentin (NEURONTIN) 100 MG capsule Take 2 capsules (200 mg total) by mouth at bedtime. For restless leg 02/16/17   Money, Gerlene Burdock, FNP  hydrOXYzine (ATARAX/VISTARIL) 25 MG tablet Take 1 tablet (25 mg total) by mouth 3 (three) times daily as needed for anxiety. 02/16/17   Money, Gerlene Burdock, FNP  QUEtiapine (SEROQUEL) 200 MG tablet Take 1 tablet (200 mg total) by mouth at bedtime. For mood control 02/16/17   Money, Gerlene Burdock, FNP  QUEtiapine (SEROQUEL) 25 MG tablet Take 1 tablet (25 mg total) by mouth 2 (two) times daily. For mood control 02/16/17   Money, Feliz Beam B, FNP  sertraline (ZOLOFT) 25 MG tablet Take 1 tablet (25 mg total) by mouth daily. For mood control 02/17/17   Money, Gerlene Burdock, FNP  traZODone (DESYREL) 50 MG tablet Take 1 tablet (50 mg total) by mouth at bedtime as needed for sleep. 02/16/17   Money, Gerlene Burdock, FNP    Family History No family history on file.  Social History Social History   Tobacco Use  . Smoking status: Never Smoker  . Smokeless tobacco: Current User    Types: Chew  Substance Use Topics  . Alcohol use: Yes    Comment: occasionally  . Drug use: Yes    Types: "Crack" cocaine, Heroin, Methamphetamines, Marijuana, Cocaine  Comment: clean since 11/30/15     Allergies   Cephalexin   Review of Systems Review of Systems  All other systems reviewed and are negative.    Physical Exam Updated Vital Signs There were no vitals taken for this visit.  Physical Exam  Constitutional: He is oriented to person, place, and time. He appears well-developed and well-nourished.  Non-toxic appearance. No distress.  HENT:  Head: Normocephalic and atraumatic.  Bleeding gums noted without obvious dental injury  Eyes: Pupils are equal, round, and reactive to light. Conjunctivae, EOM and lids are normal.    Neck: Normal range of motion. Neck supple. Muscular tenderness present. No spinous process tenderness present. No tracheal deviation present. No thyroid mass present.    Cardiovascular: Normal rate, regular rhythm and normal heart sounds. Exam reveals no gallop.  No murmur heard. Pulmonary/Chest: Effort normal and breath sounds normal. No stridor. No respiratory distress. He has no decreased breath sounds. He has no wheezes. He has no rhonchi. He has no rales.  Abdominal: Soft. Normal appearance and bowel sounds are normal. He exhibits no distension. There is no tenderness. There is no rebound and no CVA tenderness.  Musculoskeletal: Normal range of motion. He exhibits no edema or tenderness.  Neurological: He is alert and oriented to person, place, and time. He has normal strength. No cranial nerve deficit or sensory deficit. GCS eye subscore is 4. GCS verbal subscore is 5. GCS motor subscore is 6.  Skin: Skin is warm and dry. No abrasion and no rash noted.  Psychiatric: His speech is normal. His affect is blunt. He is slowed. He is not actively hallucinating. He is attentive.  Nursing note and vitals reviewed.    ED Treatments / Results  Labs (all labs ordered are listed, but only abnormal results are displayed) Labs Reviewed - No data to display  EKG None  Radiology No results found.  Procedures Procedures (including critical care time)  Medications Ordered in ED Medications - No data to display   Initial Impression / Assessment and Plan / ED Course  I have reviewed the triage vital signs and the nursing notes.  Pertinent labs & imaging results that were available during my care of the patient were reviewed by me and considered in my medical decision making (see chart for details).     Patient with evidence of possible pneumonia seen on patient's neck CT.  Will place on Augmentin as this could be from aspiration pneumonia given his history of substance abuse and will  discharge home.  He has no signs of respiratory compromise.  Blood sugar noted and patient will be informed to have his blood sugar repeated by his doctor  Final Clinical Impressions(s) / ED Diagnoses   Final diagnoses:  None    ED Discharge Orders    None       Lorre NickAllen, Jacqueleen Pulver, MD 04/21/18 1157    Lorre NickAllen, Corneshia Hines, MD 04/21/18 1158

## 2018-05-29 DIAGNOSIS — Z1389 Encounter for screening for other disorder: Secondary | ICD-10-CM | POA: Diagnosis not present

## 2018-05-29 DIAGNOSIS — E668 Other obesity: Secondary | ICD-10-CM | POA: Diagnosis not present

## 2018-05-29 DIAGNOSIS — Z23 Encounter for immunization: Secondary | ICD-10-CM | POA: Diagnosis not present

## 2018-07-02 ENCOUNTER — Ambulatory Visit (INDEPENDENT_AMBULATORY_CARE_PROVIDER_SITE_OTHER): Payer: Self-pay | Admitting: Physician Assistant

## 2018-07-05 ENCOUNTER — Encounter: Payer: Self-pay | Admitting: Neurology

## 2018-07-10 ENCOUNTER — Ambulatory Visit: Payer: BLUE CROSS/BLUE SHIELD | Admitting: Neurology

## 2018-07-10 ENCOUNTER — Encounter: Payer: Self-pay | Admitting: Neurology

## 2018-07-10 VITALS — BP 123/72 | HR 64 | Ht 73.0 in | Wt 268.0 lb

## 2018-07-10 DIAGNOSIS — F319 Bipolar disorder, unspecified: Secondary | ICD-10-CM

## 2018-07-10 DIAGNOSIS — F332 Major depressive disorder, recurrent severe without psychotic features: Secondary | ICD-10-CM | POA: Diagnosis not present

## 2018-07-10 DIAGNOSIS — G478 Other sleep disorders: Secondary | ICD-10-CM

## 2018-07-10 DIAGNOSIS — F1920 Other psychoactive substance dependence, uncomplicated: Secondary | ICD-10-CM

## 2018-07-10 DIAGNOSIS — R0683 Snoring: Secondary | ICD-10-CM | POA: Diagnosis not present

## 2018-07-10 NOTE — Progress Notes (Signed)
SLEEP MEDICINE CLINIC   Provider:  Melvyn Novas, MD   Primary Care Physician:  Martha Clan, MD   Referring Provider: Martha Clan, MD    Chief Complaint  Patient presents with  . New Patient (Initial Visit)    pt alone, rm 11. pt presents today because over the yrs he has been told he stops breathing over night and snores in sleep. pt has never had a sleep study completed. pt complains of feeling tired and fatigue during the daytime.     HPI:  [name] is a 28 y.o. male , seen here as in a referral/ revisit  from Dr. Clelia Croft for evaluation of fatigue, EDS and witnessed apnea.   Chief complaint according to patient :" I am always tired, have hic- ups and my mother has OSA"  [name] is a 60 year old right-handed Caucasian gentleman referred by Dr. Martha Clan of [facility] who states that he has been fatigued and severely fatigued for very long time maybe forever.  He does struggle with some obesity, he has often tips a BMI of 35, he was a current everyday smoker as of January of this year.  His medical history is positive for attention deficit disorder, migraines, epistaxis, dermatitis, diverticulitis with a perforation in October 2000.  He had a recurrent C of this dangerous situation in March 2015.   He has a history of heroin abuse.  He has not used opiates in 3 years .  Sleep medical history: see above.   Sleep habits are as follows: suppertime varies, depending on work. 8-5 Pm , but often over time. Commutes 30 minutes. Eats out a lot, bedtime varies , too usually 11 Pm. The bedroom is cool, quiet and dark.  He sleeps promptly.  Sleep position is on his sides and back- multiple pillows are used.  He sleeps through for many hours, no nocturia. He wakes up with HA as well as with a dry mouth-  he breathes through his mouth, has a lot of nasal allergic congestion. Family has witnessed his snoring and apneas. 6.30 AM is his rising time.    Family sleep history: mother has OSA, brother has ADHD, both GF were alcohol abusers.   Social history: He graduated in [place], [place] form [employer] in 2010. He smokes but changed to [facility] ,  Quit alcohol 2 years ago, heroin 3 years ago. Lives alone, drives, gainfully employed.     Review of Systems: Out of a complete 14 system review, the patient complains of only the following symptoms, and all other reviewed systems are negative:   Nasal congestion, restless, hay-fever, snoring, overweight. ADD- ADHD with impulse control issues.  Likes physical labour.   depression score=PHQ score was 12 points   Epworth score: 9 , Fatigue severity score 25 ,    Social History   Socioeconomic History  . Marital status: Single    Spouse name: Not on file  . Number of children: Not on file  . Years of education: Not on file  . Highest education level: Not on file  Occupational History  . Not on file  Social Needs  . Financial resource strain: Not on file  . Food insecurity:    Worry: Not on file    Inability: Not on file  . Transportation needs:    Medical: Not on file    Non-medical: Not on file  Tobacco Use  . Smoking status: Never Smoker  . Smokeless tobacco:  Former Neurosurgeon    Types: Chew  Substance and Sexual Activity  . Alcohol use: Not Currently    Comment: occasionally  . Drug use: Not Currently    Types: "Crack" cocaine, Heroin, Methamphetamines, Marijuana, Cocaine    Comment: clean since 11/30/15  . Sexual activity: Yes    Birth control/protection: Condom  Lifestyle  . Physical activity:    Days per week: Not on file    Minutes per session: Not on file  . Stress: Not on file  Relationships  . Social connections:    Talks on phone: Not on file    Gets together: Not on file    Attends religious service: Not on file    Active member of club or organization: Not on file    Attends meetings of clubs or organizations: Not on file    Relationship status: Not  on file  . Intimate partner violence:    Fear of current or ex partner: Not on file    Emotionally abused: Not on file    Physically abused: Not on file    Forced sexual activity: Not on file  Other Topics Concern  . Not on file  Social History Narrative  . Not on file    Family History  Problem Relation Age of Onset  . Neuropathy Mother   . Sleep apnea Mother   . Lung cancer Maternal Grandmother   . Alcoholism Maternal Grandfather   . Alcoholism Paternal Grandfather     Past Medical History:  Diagnosis Date  . Accidental heroin overdose (HCC)   . Anxiety   . Attention deficit disorder   . Depression   . Diverticulitis   . Hiccups    pt states hiccups during sleep     Past Surgical History:  Procedure Laterality Date  . colonscopy      removed polyps   . LAPAROSCOPIC PARTIAL COLECTOMY N/A 07/01/2014   Procedure: LAPAROSCOPIC ASSISTED SIGMOID COLECTOMY WITH MOBILIZATION OF SPLENIC FLEXURE;  Surgeon: Avel Peace, MD;  Location: [facility];  Service: General;  Laterality: N/A;    No current outpatient medications on file.   No current facility-administered medications for this visit.     Allergies as of 07/10/2018 - Review Complete 07/10/2018  Allergen Reaction Noted  . Cephalexin Anaphylaxis     Vitals: BP 123/72   Pulse 64   Ht 6\' 1"  (1.854 m)   Wt 268 lb (121.6 kg)   BMI 35.36 kg/m  Last Weight:  Wt Readings from Last 1 Encounters:  07/10/18 268 lb (121.6 kg)   DUK:GURK mass index is 35.36 kg/m.     Last Height:   Ht Readings from Last 1 Encounters:  07/10/18 6\' 1"  (1.854 m)    Physical exam:  General: The patient is awake, alert and appears not in acute distress. The patient is well groomed. Head: Normocephalic, atraumatic. Neck is supple. Mallampati 2- but with a midline enlarged, puffy uvula.  ,  neck circumference:17. Nasal airflow congested - severely . Retrognathia is high grade .  Cardiovascular:  Regular rate and rhythm , without  murmurs  or carotid bruit, and without distended neck veins. Respiratory: Lungs are clear to auscultation. Skin:  Without evidence of edema, or rash Trunk: BMI is 35 plus.. The patient's posture is relaxed   Neurologic exam : The patient is awake and alert, oriented to place and time.   Attention span & concentration ability appears normal.  Speech is fluent,  without  dysarthria, dysphonia or aphasia.  Mood and affect are appropriate.  Cranial nerves: Pupils are equal and briskly reactive to light. Extraocular movements  in vertical and horizontal planes intact and without nystagmus. Visual fields by finger perimetry are intact. Hearing to finger rub intact. Facial sensation intact to fine touch. Facial motor strength is symmetric and tongue and uvula move midline. Shoulder shrug was symmetrical.   Motor exam:   Normal tone, muscle bulk and symmetric strength in all extremities.  Sensory:  Fine touch, pinprick and vibration were tested in all extremities. Proprioception tested in the upper extremities was normal. Coordination: no changes in penman ship.  Finger-to-nose maneuver  normal without evidence of ataxia, dysmetria or tremor. Gait and station: Patient walks without assistive device and is able to turn with 3 Steps.  Deep tendon reflexes: in the  upper and lower extremities are symmetric and intact.  Assessment:  After physical and neurologic examination, review of laboratory studies,  Personal review of imaging studies, reports of other /same  Imaging studies, results of polysomnography and / or neurophysiology testing and pre-existing records as far as provided in visit., my assessment is :  1) I have the pleasure of meeting [name], and meanwhile 28 year old Caucasian right-handed male patient of Dr. Lura Em is a history of ADD but I believe it is more ADHD.  He is restless and moves incessantly while seated, and he declares that he has some difficulties with impulse  control.  Unfortunately he has also had his brush with polysubstance abuse, currently he is vaping but he is no longer drinking alcohol and he has not used heroin in several years.  He is gainfully full-time employed and lives in his own apartment.  He feels however always fatigued and sleepy and he has gained weight over the last 3 years.  His parents have noted him to snore and have apnea and thus he has been referred for a sleep apnea test.  I will order this test for him that is most likely to be a home sleep test based on his insurance.  He is not excessively sleepy or fatigued by numeric value.  He is not diabetic, he does not have hypertension.  At this time he is single without children of his own he has in the past found some relief by using Adderall but given the substance abuse history this medication is out of the question now.  Dr. Sherryll Burger already noted that there needs to be some psychiatric follow-up care.  I will concentrate on his sleep habits, encouraged him to have more regular bedtimes and mealtimes and may be time for a regular exercise regimen as well.  After the sleep test we will meet and discuss the results and if apnea is found we will decide on a treatment.  I thank Dr. Martha Clan for allowing me to provide sleep care for this now mutual patient.  The patient was advised of the nature of the diagnosed disorder , the treatment options and the  risks for general health and wellness arising from not treating the condition.   I spent more than 35 minutes of face to face time with the patient.  Greater than 50% of time was spent in counseling and coordination of care. We have discussed the diagnosis and differential and I answered the patient's questions.       Melvyn Novas, MD 07/10/2018, 3:14 PM  Certified in Neurology by ABPN Certified in Sleep Medicine by [facility] [address], [address]  [place], [place] [address]

## 2018-07-12 ENCOUNTER — Encounter: Payer: Medicaid Other | Admitting: Family

## 2018-07-12 ENCOUNTER — Telehealth: Payer: Self-pay | Admitting: Pharmacy Technician

## 2018-07-12 NOTE — Telephone Encounter (Signed)
[name] gave me a hard copy of [name]'s Hepatitic C viral load.  I will file it away for when he makes an office visit.    The lab date is 05/30/2018  [name]. [name] CPhT Specialty Pharmacy Patient [facility] Disease Phone: [phone] Fax:  [phone]

## 2018-07-25 ENCOUNTER — Encounter: Payer: BLUE CROSS/BLUE SHIELD | Admitting: Neurology

## 2018-07-25 ENCOUNTER — Other Ambulatory Visit: Payer: Self-pay

## 2018-07-26 ENCOUNTER — Telehealth: Payer: Self-pay

## 2018-07-26 NOTE — Telephone Encounter (Signed)
Pt was confirmed for the HST pick up appt. He did not show up, called home number and mother answered. She actually came to sleep lab to pick up the HST for patient. Mother dropped it off today, stating with frustration, that he did not wear it. She stated for [place] to not call him back to reschedule and to leave it up to patient to call us back.

## 2018-08-21 ENCOUNTER — Telehealth: Payer: Self-pay | Admitting: Infectious Diseases

## 2018-08-21 NOTE — Telephone Encounter (Signed)
COVID-19 Pre-Screening Questions: ° °Do you currently have a fever (>100 °F), chills or unexplained body aches? No  ° °Are you currently experiencing new cough, shortness of breath, sore throat, runny nose? No  °•  °Have you recently travelled outside the state of  in the last 14 days? No  °•  °Have you been in contact with someone that is currently pending confirmation of Covid19 testing or has been confirmed to have the Covid19 virus?  No  °

## 2018-08-22 ENCOUNTER — Ambulatory Visit (INDEPENDENT_AMBULATORY_CARE_PROVIDER_SITE_OTHER): Payer: BLUE CROSS/BLUE SHIELD | Admitting: Infectious Diseases

## 2018-08-22 ENCOUNTER — Telehealth: Payer: Self-pay | Admitting: Pharmacy Technician

## 2018-08-22 ENCOUNTER — Encounter: Payer: Self-pay | Admitting: Infectious Diseases

## 2018-08-22 ENCOUNTER — Other Ambulatory Visit: Payer: Self-pay

## 2018-08-22 VITALS — Ht 73.0 in | Wt 251.0 lb

## 2018-08-22 DIAGNOSIS — B182 Chronic viral hepatitis C: Secondary | ICD-10-CM | POA: Diagnosis not present

## 2018-08-22 DIAGNOSIS — Z23 Encounter for immunization: Secondary | ICD-10-CM | POA: Diagnosis not present

## 2018-08-22 DIAGNOSIS — F1111 Opioid abuse, in remission: Secondary | ICD-10-CM | POA: Diagnosis not present

## 2018-08-22 NOTE — Progress Notes (Signed)
Patient Name: [name]  Date of Birth: [birth year]  MRN: [id number]  PCP: Martha Clan, MD  Referring Provider: Martha Clan, MD, Ph#: [phone]   Patient Active Problem List   Diagnosis Date Noted  . Chronic hepatitis C without hepatic coma (HCC) 08/22/2018  . Need for 23-polyvalent pneumococcal polysaccharide vaccine 08/22/2018  . Need for immunization against influenza 08/22/2018  . Bipolar 1 disorder (HCC) 02/12/2017  . Recurrent major depression-severe (HCC) 08/07/2016  . Substance induced mood disorder (HCC)   . Atypical depression   . Mild opioid abuse in early remission in controlled environment (HCC) 08/23/2014  . Sigmoid diverticulitis s/p lap assisted sigmoid colectomy 07/01/14 07/01/2014  . Diverticulitis 12/06/2013  . Diverticulitis of large intestine without perforation or abscess without bleeding 12/06/2013  . Obesity, unspecified 12/06/2013  . NAUSEA AND VOMITING 02/09/2009  . OTHER SYMPTOMS INVOLVING DIGESTIVE SYSTEM OTHER 02/09/2009    CC:  New patient - initial evaluation and management of chronic hepatitis C infection.  No complaints or concerns currently.  HPI/ROS:  [name] is a 28 y.o. male is a 28 y.o. male.   Patient tested positive 10 years ago initially in the setting of injection drug use.  He tells me he started when he was 28 years old with intravenous heroin use.  He has been clean following [facility] for nearly a year.  He does continue to use marijuana daily.  He does not use methadone or Suboxone for replacement therapy.  He has modified his environment and attend support groups.  He does smoke cigarettes daily but keeps alcohol use to a minimum because of his condition.  He is currently sexually active with one male partner.  They use condoms consistently.  Has no family history of liver disease or liver cancer.  [facility] from [facility].  Negative HIV test, negative hep B surface antigen.   Positive hep C viral load in January.  Genotype unknown.   Constitutional: negative for fevers, chills, malaise and anorexia. Positive fatigue.  Eyes: negative for icterus Respiratory: negative for cough or dyspnea on exertion Cardiovascular: negative for chest pain, orthopnea, lower extremity edema Gastrointestinal: negative for nausea, vomiting, change in bowel habits and abdominal pain Hematologic/lymphatic: negative for easy bruising, bleeding and petechiae Musculoskeletal: negative for stiff joints All other systems reviewed and are negative      Past Medical History:  Diagnosis Date  . Accidental heroin overdose (HCC)   . Anxiety   . Attention deficit disorder   . Depression   . Diverticulitis   . Hiccups    pt states hiccups during sleep     Prior to Admission medications   Not on File    Allergies  Allergen Reactions  . Cephalexin Anaphylaxis    Social History   Tobacco Use  . Smoking status: Smoker, Current Status Unknown    Types: Cigarettes    Start date: 08/15/2003  . Smokeless tobacco: Former Neurosurgeon    Types: Chew  Substance Use Topics  . Alcohol use: Not Currently    Comment: occasionally  . Drug use: Not Currently    Types: "Crack" cocaine, Heroin, Methamphetamines, Marijuana, Cocaine    Comment: clean since 11/30/15    Family History  Problem Relation Age of Onset  . Neuropathy Mother   . Sleep apnea Mother   . Lung cancer Maternal Grandmother   . Alcoholism Maternal Grandfather   . Alcoholism Paternal Grandfather     Objective:  There were no  vitals filed for this visit. Constitutional: in no apparent distress, well developed and well nourished and oriented times 3 Eyes: anicteric Cardiovascular: Cor RRR and No murmurs Respiratory: clear Gastrointestinal: Bowel sounds are normal, liver is not enlarged, spleen is not enlarged Musculoskeletal: peripheral pulses normal, no pedal edema, no clubbing or cyanosis Skin: negative for - jaundice,  spider hemangioma, telangiectasia, palmar erythema, ecchymosis and atrophy; no porphyria cutanea tarda Lymphatic: no cervical lymphadenopathy   Laboratory: Genotype: No results found for: HCVGENOTYPE HCV viral load: No results found for: HCVQUANT Lab Results  Component Value Date   WBC 6.6 02/14/2017   HGB 14.5 02/14/2017   HCT 42.2 02/14/2017   MCV 81.9 02/14/2017   PLT 250 02/14/2017    Lab Results  Component Value Date   CREATININE 1.00 02/12/2017   BUN 16 02/12/2017   NA 135 02/12/2017   K 3.8 02/12/2017   CL 99 (L) 02/12/2017   CO2 27 02/12/2017    Lab Results  Component Value Date   ALT 256 (H) 02/14/2017   AST 89 (H) 02/14/2017   ALKPHOS 85 02/14/2017    Lab Results  Component Value Date   INR 0.99 12/15/2015   BILITOT 0.7 02/14/2017   ALBUMIN 3.7 02/14/2017    Imaging:    Assessment & Plan:   Problem List Items Addressed This Visit      Unprioritized   Mild opioid abuse in early remission in controlled environment (HCC)    [name] has [facility].  He continues to abstain without any replacement therapy currently.      Chronic hepatitis C without hepatic coma (HCC) - Primary    New Patient with Chronic Hepatitis C genotype unknown, treatment naive.   I discussed with the patient the lab findings that confirm chronic hepatitis C as well as the natural history and progression of disease including about 30% of people who develop cirrhosis of the liver if left untreated and once cirrhosis is established there is a 2-7% risk per year of liver cancer and liver failure.  I discussed the importance of treatment and benefits in reducing the risk, even if significant liver fibrosis exists. I also discussed risk for re-infection following treatment should he not continue to modify risk factors.    Patient counseled extensively on limiting acetaminophen to no more than 2 grams daily, avoidance of alcohol.  Transmission discussed with  patient including sexual transmission, sharing razors and toothbrush.   Will need referral to gastroenterology if concern for cirrhosis  He is currently involved in substance abuse program and support group.  Will prescribe appropriate medication based on genotype and coverage   Hepatitis A and B titers to be drawn today with appropriate vaccinations as needed   Pneumovax and flu vaccines given today.  Further work up to include liver staging through non-invasive serum analysis with APRI and FIB4 scores and Liver Fibrosis panel; U/S to follow if discordant or concerning results.  Will call [name] back once all results are in and counsel on medication over the phone.  He will return 4 weeks after starting to meet with pharmacy team and check RNA at that time.        Relevant Orders   Hepatitis C genotype   Liver Fibrosis, FibroTest-ActiTest   Hepatic function panel   CBC   Hepatitis B surface antibody,qualitative   Hepatitis B Core Antibody, total   Hepatitis A Ab, Total   Protime-INR   Need for 23-polyvalent pneumococcal  polysaccharide vaccine   Relevant Orders   Pneumococcal polysaccharide vaccine 23-valent greater than or equal to 2yo subcutaneous/IM (Completed)   Need for immunization against influenza   Relevant Orders   Flu Vaccine QUAD 36+ mos IM (Completed)      I spent 45 minutes with the patient including greater than 70% of time in face to face counsel of the patient re hepatitis c and the details described above and in coordination of their care.  Rexene Alberts, MSN, NP-C [facility]  [email or web address] Pager: [phone] Office: [phone] [facility] Main Line: [phone]

## 2018-08-22 NOTE — Assessment & Plan Note (Signed)
New Patient with Chronic Hepatitis C genotype unknown, treatment naive.   I discussed with the patient the lab findings that confirm chronic hepatitis C as well as the natural history and progression of disease including about 30% of people who develop cirrhosis of the liver if left untreated and once cirrhosis is established there is a 2-7% risk per year of liver cancer and liver failure.  I discussed the importance of treatment and benefits in reducing the risk, even if significant liver fibrosis exists. I also discussed risk for re-infection following treatment should he not continue to modify risk factors.    Patient counseled extensively on limiting acetaminophen to no more than 2 grams daily, avoidance of alcohol.  Transmission discussed with patient including sexual transmission, sharing razors and toothbrush.   Will need referral to gastroenterology if concern for cirrhosis  He is currently involved in substance abuse program and support group.  Will prescribe appropriate medication based on genotype and coverage   Hepatitis A and B titers to be drawn today with appropriate vaccinations as needed   Pneumovax and flu vaccines given today.  Further work up to include liver staging through non-invasive serum analysis with APRI and FIB4 scores and Liver Fibrosis panel; U/S to follow if discordant or concerning results.  Will call KIEN COLOMBO back once all results are in and counsel on medication over the phone.  He will return 4 weeks after starting to meet with pharmacy team and check RNA at that time.

## 2018-08-22 NOTE — Assessment & Plan Note (Signed)
[name] has completed a program with [facility].  He continues to abstain without any replacement therapy currently.

## 2018-08-22 NOTE — Telephone Encounter (Signed)
[facility] Patient Product/process development scientist completed.    The patient in insured through [employer].    We will continue to follow to see if copay assistance is needed.  [name]. [name] CPhT Specialty Pharmacy Patient [facility] for Infectious Disease Phone: [phone] Fax:  [phone]

## 2018-08-22 NOTE — Patient Instructions (Addendum)
Nice to meet you today!    We need to get a little more information about your hepatitis c infection before we start your treatment. I anticipate that we can get you started in a few weeks after we submit approval to your insurance to ensure payment. We may need to place referral for an ultrasound and/or gastroenterology if your blood work indicates more damage to the liver than expected.     ABOUT HEPATITIS C VIRUS:   Chronic Hepatitis C is the leading cause of cirrhosis, liver cancer, and end stage liver disease requiring transplantation when this infection goes untreated for many years  There are not many specific symptoms of early infection and often goes unrecognized until specific blood test is ordered  The hepatitis c virus is passed primarily through direct exposure of contaminated blood or body fluids -   Risk for sexual transmission is very low but is possible if there is high frequency of unprotected sexual activity with known hepatitis c partner or multiple partners of known status.  Advanced cirrhosis occurs in approximately 10-20% of those with chronic infection.  Approximately 15-25% clear spontaneously (usually in the first 6 months of becoming exposed to virus)  Newer medications provide over 90% cure rate when taken as prescribed  IN GENERAL ABOUT DIET  . Persons living with chronic hepatitis c infection should have a balanced diet and choose nutritious foods from each major food group to maintain a healthy weight and avoid nutritional deficiencies.  .  . Patients with cirrhosis should not have protein restriction; we recommend a protein intake of approximately 1.2-1.5 g/kg/day.  . For patients with cirrhosis and hepatic encephalopathy, the American Association for the Study of Liver Diseases (AASLD) recommended protein intake is 1.2-1.5 g/kg/day.[82] . If you experience ascites please limit sodium intake to < 2000 mg a day   UNTIL YOU HAVE BEEN TREATED AND  CURED:  . Use condoms with sexual encounters or abstinence . No sharing of razors, toothbrushes, nail clippers or anything that could potentially have blood on it.  . If you cut yourself and blood spills onto item/surface please clean with 1:10 bleach solution and allow to dry, EVEN if it is dried blood.  . Limit alcohol to as little as possible to less than 1 drink a day - this is very irritating to your liver. . Limit tylenol use to less than 2,000 mg daily (two extra strength tablets only twice a day) . If you have cirrhosis of the liver please take no more than 1,000 mg tylenol a day   GENERAL HELPFUL HINTS ON HCV THERAPY: 1. Stay well-hydrated. 2. Notify the ID Clinic of any changes in your other over-the-counter/herbal or prescription medications. 3. If you miss a dose of your medication, take the missed dose as soon as you remember. Return to your regular time/dose schedule the next day.  4.  Do not stop taking your medications without first talking with your healthcare provider. 5.  You will see our pharmacist-specialist within the first 2 weeks of starting your medication to monitor for any possible side effects. 6.  You will have blood work once during treatment 4 weeks after your first pill. Again soon after treatment is completed and one final lab 3 months after your last pill to ensure cure!   TIPS TO BE SUCCESSFUL WITH DAILY MEDICATION USE: 1. Set a reminder on your phone  2. Try filling out a pill box for the week - pick a day and put one  pill for every day during the week so you know right away if you missed a pill.  3. Have a trusted family member ask you about your medications.  4. Smartphone app    Medication we would like to use for you will be : MAVYRET  Mavyret Instructions:  1. Take Mavyret, three tablets (at the same time) daily with food. Please take ALL THREE PILLS AT ONCE. You should take it at approximately the same time every day. Treatment will be for 8  weeks. Do not miss a dose.    2. Do not run out of Mavyret! If you are down to one week of medication left and have not heard about your next shipment, please let us know as soon as possible. You will be given 28 days of treatment at a time and will receive one refill.   3. If you need to start a new medication, prescription from your doctor or over the counter medication, you need to contact us to make sure it does not interfere with Mavyret. There are several medications that can interfere with Mavyret and can make you sick or make the medication not work.  4. If you need to take a medication for acid reflux, you can take omeprazole 20mg  daily.     5. Tylenol (acetominophen) and Advil (ibuprofen) are safe to take with Harvoni if needed for headache, fever, pain.   6. IF YOU ARE ON BIRTH CONTROL PILLS, YOU RECEIVE A SHOT FOR BIRTH CONTROL, OR YOU HAVE AN IUD, please notify your provider to make sure it is safe with Mavyret.   7. DO NOT stop Mavyret unless instructed to by your provider. If you are hospitalized while taking this medication please bring it with you to the hospital to avoid interruption of therapy. Every pill is important!  8. The most common side effects associated with Mavyret include:  o Fatigue o Headache o Nausea o Diarrhea o Insomnia    o Headache o Nausea o Diarrhea o Insomnia

## 2018-08-26 LAB — PROTIME-INR
INR: 1
Prothrombin Time: 10.1 s (ref 9.0–11.5)

## 2018-08-26 LAB — LIVER FIBROSIS, FIBROTEST-ACTITEST
ALT: 29 U/L (ref 9–46)
Alpha-2-Macroglobulin: 147 mg/dL (ref 106–279)
Apolipoprotein A1: 120 mg/dL (ref 94–176)
Bilirubin: 0.5 mg/dL (ref 0.2–1.2)
Fibrosis Score: 0.07
GGT: 18 U/L (ref 3–70)
Haptoglobin: 197 mg/dL (ref 43–212)
Necroinflammat ACT Score: 0.1
Reference ID: 2918008

## 2018-08-26 LAB — HEPATIC FUNCTION PANEL
AG Ratio: 1.4 (calc) (ref 1.0–2.5)
ALT: 28 U/L (ref 9–46)
AST: 18 U/L (ref 10–40)
Albumin: 3.9 g/dL (ref 3.6–5.1)
Alkaline phosphatase (APISO): 62 U/L (ref 36–130)
Bilirubin, Direct: 0.1 mg/dL (ref 0.0–0.2)
Globulin: 2.7 g/dL (calc) (ref 1.9–3.7)
Indirect Bilirubin: 0.4 mg/dL (calc) (ref 0.2–1.2)
Total Bilirubin: 0.5 mg/dL (ref 0.2–1.2)
Total Protein: 6.6 g/dL (ref 6.1–8.1)

## 2018-08-26 LAB — CBC
HCT: 39.8 % (ref 38.5–50.0)
Hemoglobin: 13.7 g/dL (ref 13.2–17.1)
MCH: 29.4 pg (ref 27.0–33.0)
MCHC: 34.4 g/dL (ref 32.0–36.0)
MCV: 85.4 fL (ref 80.0–100.0)
MPV: 10.3 fL (ref 7.5–12.5)
Platelets: 229 10*3/uL (ref 140–400)
RBC: 4.66 10*6/uL (ref 4.20–5.80)
RDW: 13 % (ref 11.0–15.0)
WBC: 4.1 10*3/uL (ref 3.8–10.8)

## 2018-08-26 LAB — HEPATITIS C GENOTYPE: HCV Genotype: 2

## 2018-08-26 LAB — HEPATITIS A ANTIBODY, TOTAL: Hepatitis A AB,Total: NONREACTIVE

## 2018-08-26 LAB — HEPATITIS B SURFACE ANTIBODY,QUALITATIVE: Hep B S Ab: NONREACTIVE

## 2018-08-26 LAB — HEPATITIS B CORE ANTIBODY, TOTAL: Hep B Core Total Ab: NONREACTIVE

## 2018-08-27 NOTE — Progress Notes (Signed)
Patient with no evidence of fibrosis on FibroTest (F0). Correlates with low FIB4 of 0.42. With both methods indicating he is statistically at very low risk for fibrosis, <90%, will not need abdominal ultrasound.  Will proceed with securing Mavyret x 8 w for him to treat his hepatitis c infection.  I called Nicole and reviewed his labs and informed him our pharmacy team will be in touch with him once we can arrange for medication approval. He will await their call.   He will need to start hepatitis b and a vaccines at his upcoming visit with ID pharmacy team.

## 2018-08-28 ENCOUNTER — Other Ambulatory Visit: Payer: Self-pay | Admitting: Pharmacist

## 2018-08-28 DIAGNOSIS — B182 Chronic viral hepatitis C: Secondary | ICD-10-CM

## 2018-08-28 MED ORDER — GLECAPREVIR-PIBRENTASVIR 100-40 MG PO TABS: 3.0000 | ORAL_TABLET | Freq: Every day | ORAL | 1 refills | Status: DC

## 2018-08-28 NOTE — Progress Notes (Signed)
Sending Mavyret x 8 weeks to [facility]. We will being the PA process and let the patient know when he is approved.

## 2018-08-28 NOTE — Progress Notes (Signed)
Thank you :)

## 2018-08-29 ENCOUNTER — Telehealth: Payer: Self-pay | Admitting: Pharmacy Technician

## 2018-08-29 NOTE — Telephone Encounter (Signed)
[employer] required more information for patient to get approved.  Faxed in office note from Rexene Alberts, NP, viral load, and genotype to [phone].

## 2018-08-29 NOTE — Telephone Encounter (Signed)
[facility] Patient Advocate Encounter   Received notification from [facility] that prior authorization for MAVYRET is required.   PA submitted on 08/29/2018 Key [id number] Status is pending    [facility] will continue to follow.   Netty Starring. Dimas Aguas CPhT Specialty Pharmacy Patient [facility] Phone: [phone] Fax:  [phone]

## 2018-08-30 ENCOUNTER — Telehealth: Payer: Self-pay | Admitting: Pharmacist

## 2018-08-30 ENCOUNTER — Encounter: Payer: Self-pay | Admitting: Pharmacy Technician

## 2018-08-30 NOTE — Telephone Encounter (Signed)
[facility] Patient Advocate Encounter  Prior Authorization for Mavyret has been approved.    PA# [id number] Effective dates: 08/29/2018 through 02/28/2019  Patients co-pay is $0.   [facility] will continue to follow.  Netty Starring. Dimas Aguas CPhT Specialty Pharmacy Patient [facility] Phone: [phone] Fax:  [phone]

## 2018-08-30 NOTE — Telephone Encounter (Signed)
Patient is approved to receive Mavyret x 8 weeks for chronic Hepatitis C infection. Counseled patient to take all three tablets of Mavyret daily with food.  Counseled patient the need to take all three tablets together and to not separate them out during the day. Encouraged patient not to miss any doses and explained how their chance of cure could go down with each dose missed. Counseled patient on what to do if dose is missed - if it is closer to the missed dose take immediately; if closer to next dose then skip dose and take the next dose at the usual time.   Counseled patient on common side effects such as headache, fatigue, and nausea and that these normally decrease with time. I reviewed patient medications and found no drug interactions. Discussed with patient that there are several drug interactions with Mavyret and instructed patient to call the clinic if he wishes to start a new medication during course of therapy. Also advised patient to call if he experiences any side effects. Patient will follow-up with me in the pharmacy clinic on 5/28.

## 2018-08-30 NOTE — Telephone Encounter (Signed)
Thank you :)

## 2018-08-31 MED FILL — MAVYRET 100-40 MG TABS: 100-40 | 28 days supply | Qty: 84 | Fill #0

## 2018-09-24 MED FILL — MAVYRET 100-40 MG TABS: 100-40 | 28 days supply | Qty: 84 | Fill #1

## 2018-10-11 ENCOUNTER — Ambulatory Visit: Payer: BLUE CROSS/BLUE SHIELD | Admitting: Pharmacist

## 2018-10-11 ENCOUNTER — Ambulatory Visit: Payer: Self-pay | Admitting: Pharmacist

## 2018-10-17 ENCOUNTER — Telehealth: Payer: Self-pay | Admitting: Pharmacist

## 2018-10-17 NOTE — Telephone Encounter (Signed)
COVID-19 Pre-Screening Questions: ° °Do you currently have a fever (>100 °F), chills or unexplained body aches? No   ° °Are you currently experiencing new cough, shortness of breath, sore throat, runny nose? No   °•  °Have you recently travelled outside the state of [place] in the last 14 days? no °•  °1. Have you been in contact with someone that is currently pending confirmation of Covid19 testing or has been confirmed to have the Covid19 virus?  No  ° °

## 2018-10-18 ENCOUNTER — Ambulatory Visit (INDEPENDENT_AMBULATORY_CARE_PROVIDER_SITE_OTHER): Payer: BC Managed Care – PPO | Admitting: Pharmacist

## 2018-10-18 ENCOUNTER — Other Ambulatory Visit: Payer: Self-pay

## 2018-10-18 DIAGNOSIS — B182 Chronic viral hepatitis C: Secondary | ICD-10-CM

## 2018-10-18 NOTE — Progress Notes (Signed)
HPI: [name] is a 28 y.o. male who [facility] C follow-up.  Medication: Mavyret x 8 weeks  Start Date: 09/03/2018  Hepatitis C Genotype: 2  Fibrosis Score: F0  Hepatitis C RNA: ?  Patient Active Problem List   Diagnosis Date Noted  . Chronic hepatitis C without hepatic coma (HCC) 08/22/2018  . Need for 23-polyvalent pneumococcal polysaccharide vaccine 08/22/2018  . Need for immunization against influenza 08/22/2018  . Bipolar 1 disorder (HCC) 02/12/2017  . Recurrent major depression-severe (HCC) 08/07/2016  . Substance induced mood disorder (HCC)   . Atypical depression   . Mild opioid abuse in early remission in controlled environment (HCC) 08/23/2014  . Sigmoid diverticulitis s/p lap assisted sigmoid colectomy 07/01/14 07/01/2014  . Diverticulitis 12/06/2013  . Diverticulitis of large intestine without perforation or abscess without bleeding 12/06/2013  . Obesity, unspecified 12/06/2013  . NAUSEA AND VOMITING 02/09/2009  . OTHER SYMPTOMS INVOLVING DIGESTIVE SYSTEM OTHER 02/09/2009    Patient's Medications  New Prescriptions   No medications on file  Previous Medications   GLECAPREVIR-PIBRENTASVIR (MAVYRET) 100-40 MG TABS    Take 3 tablets by mouth daily with breakfast.  Modified Medications   No medications on file  Discontinued Medications   No medications on file    Allergies: Allergies  Allergen Reactions  . Cephalexin Anaphylaxis    Past Medical History: Past Medical History:  Diagnosis Date  . Accidental heroin overdose (HCC)   . Anxiety   . Attention deficit disorder   . Depression   . Diverticulitis   . Hiccups    pt states hiccups during sleep     Social History: Social History   Socioeconomic History  . Marital status: Single    Spouse name: Not on file  . Number of children: Not on file  . Years of education: Not on file  . Highest education level: Not on file  Occupational History  .  Not on file  Social Needs  . Financial resource strain: Not on file  . Food insecurity:    Worry: Not on file    Inability: Not on file  . Transportation needs:    Medical: Not on file    Non-medical: Not on file  Tobacco Use  . Smoking status: Smoker, Current Status Unknown    Types: Cigarettes    Start date: 08/15/2003  . Smokeless tobacco: Former NeurosurgeonUser    Types: Chew  Substance and Sexual Activity  . Alcohol use: Not Currently    Comment: occasionally  . Drug use: Not Currently    Types: "Crack" cocaine, Heroin, Methamphetamines, Marijuana, Cocaine    Comment: clean since 11/30/15  . Sexual activity: Yes    Birth control/protection: Condom  Lifestyle  . Physical activity:    Days per week: Not on file    Minutes per session: Not on file  . Stress: Not on file  Relationships  . Social connections:    Talks on phone: Not on file    Gets together: Not on file    Attends religious service: Not on file    Active member of club or organization: Not on file    Attends meetings of clubs or organizations: Not on file    Relationship status: Not on file  Other Topics Concern  . Not on file  Social History Narrative  . Not on file    Labs: Hepatitis C Lab Results  Component Value Date   HCVGENOTYPE 2 08/22/2018  FIBROSTAGE F0 08/22/2018   Hepatitis B Lab Results  Component Value Date   HEPBSAB NON-REACTIVE 08/22/2018   HEPBCAB NON-REACTIVE 08/22/2018   Hepatitis A Lab Results  Component Value Date   HAV NON-REACTIVE 08/22/2018   HIV No results found for: HIV Lab Results  Component Value Date   CREATININE 1.00 02/12/2017   CREATININE 0.92 01/14/2017   CREATININE 0.98 09/20/2016   CREATININE 1.03 08/08/2016   CREATININE 0.96 12/15/2015   Lab Results  Component Value Date   AST 18 08/22/2018   AST 89 (H) 02/14/2017   AST 163 (H) 02/12/2017   ALT 29 08/22/2018   ALT 28 08/22/2018   ALT 256 (H) 02/14/2017   INR 1.0 08/22/2018   INR 0.99 12/15/2015     Assessment: [name] is here today to follow-up for his chronic Hepatitis C infection.  He started Mavyret x 8 weeks back around 09/03/2018 and is not having any issues.  He has already received his 2nd box and has about 2 weeks left before he completes treatment.  He takes all three pill together with food at night.  No side effects that he is noticing.  Will check labs today and see him back for his SVR12 visit in 3-4 months.   Plan: - Continue Mavyret x 8 weeks until completed - HCV RNA today - F/u with me for SVR12 visit on 9/22 at 145pm   L. , PharmD, BCIDP, AAHIVP, CPP Infectious Diseases Clinical Pharmacist [facility] 10/19/2018, 12:41 PM

## 2018-10-29 LAB — HEPATITIS C RNA QUANTITATIVE
HCV Quantitative Log: 6.96 Log IU/mL — ABNORMAL HIGH
HCV RNA, PCR, QN: 9050000 IU/mL — ABNORMAL HIGH

## 2018-10-30 ENCOUNTER — Telehealth: Payer: Self-pay | Admitting: Pharmacist

## 2018-10-30 ENCOUNTER — Encounter: Payer: Self-pay | Admitting: Pharmacy Technician

## 2018-10-30 NOTE — Telephone Encounter (Signed)
I am glad too!

## 2018-10-30 NOTE — Telephone Encounter (Signed)
Patient is currently prescribed Mavyret x 8 weeks for his chronic Hepatitis C infection.  I saw him back on 6/4 and he told me that he started it late but had 2 weeks left of the 8 total weeks.  We drew a Hep C RNA at that visit to assure the medication was working and it came back at 9 million (previously 1.3 million).    Called patient to discuss and he tells me that he actually started taking it 9 days ago around 6/7 (after our appt). He was embarrassed to tell me and had to get some things in order in terms of his addiction back in April when he was supposed to start it.  He is having some fatigue and nausea when he is working out in the sun since starting.  He will transition to taking it from morning to night to help with the fatigue and nausea.    He is taking all three pills together with food. He was very upset and scared to tell me he started late.  I told him that was completely fine and I was just worried the medication wasn't working or he was skipping doses (a lot). Encouraged him to not miss any doses and stressed the importance of compliance. He understands and will stay on track. Scheduled him an appt in 1 month to come and see me for repeat labs.  He will call with any issues.

## 2018-10-30 NOTE — Telephone Encounter (Signed)
Glad to see he has not failed Mavyret early into his therapy and he was truthful about the timing of his medicines. I am just happy to hear he (seems to be) now taking it correctly and consistently.   Thank you for closely following him.

## 2018-12-03 ENCOUNTER — Ambulatory Visit: Payer: BC Managed Care – PPO | Admitting: Pharmacist

## 2019-02-05 ENCOUNTER — Ambulatory Visit: Payer: BC Managed Care – PPO | Admitting: Pharmacist

## 2021-09-09 ENCOUNTER — Emergency Department (HOSPITAL_COMMUNITY): Payer: No Typology Code available for payment source

## 2021-09-09 ENCOUNTER — Other Ambulatory Visit: Payer: Self-pay

## 2021-09-09 ENCOUNTER — Encounter (HOSPITAL_COMMUNITY): Payer: Self-pay

## 2021-09-09 ENCOUNTER — Emergency Department (HOSPITAL_COMMUNITY)
Admission: EM | Admit: 2021-09-09 | Discharge: 2021-09-10 | Disposition: A | Discharge status: Home / Self Care | Payer: No Typology Code available for payment source | Attending: Emergency Medicine | Admitting: Emergency Medicine

## 2021-09-09 DIAGNOSIS — R519 Headache, unspecified: Secondary | ICD-10-CM | POA: Diagnosis not present

## 2021-09-09 DIAGNOSIS — R7989 Other specified abnormal findings of blood chemistry: Secondary | ICD-10-CM | POA: Diagnosis not present

## 2021-09-09 DIAGNOSIS — Y92410 Unspecified street and highway as the place of occurrence of the external cause: Secondary | ICD-10-CM | POA: Insufficient documentation

## 2021-09-09 DIAGNOSIS — M79662 Pain in left lower leg: Secondary | ICD-10-CM | POA: Diagnosis not present

## 2021-09-09 DIAGNOSIS — Z23 Encounter for immunization: Secondary | ICD-10-CM | POA: Insufficient documentation

## 2021-09-09 DIAGNOSIS — Z20822 Contact with and (suspected) exposure to covid-19: Secondary | ICD-10-CM | POA: Insufficient documentation

## 2021-09-09 DIAGNOSIS — R0789 Other chest pain: Secondary | ICD-10-CM | POA: Insufficient documentation

## 2021-09-09 DIAGNOSIS — S82142A Displaced bicondylar fracture of left tibia, initial encounter for closed fracture: Secondary | ICD-10-CM | POA: Diagnosis not present

## 2021-09-09 DIAGNOSIS — M542 Cervicalgia: Secondary | ICD-10-CM | POA: Insufficient documentation

## 2021-09-09 DIAGNOSIS — S8992XA Unspecified injury of left lower leg, initial encounter: Secondary | ICD-10-CM | POA: Diagnosis present

## 2021-09-09 LAB — COMPREHENSIVE METABOLIC PANEL
ALT: 108 U/L — ABNORMAL HIGH (ref 0–44)
AST: 77 U/L — ABNORMAL HIGH (ref 15–41)
Albumin: 3.6 g/dL (ref 3.5–5.0)
Alkaline Phosphatase: 75 U/L (ref 38–126)
Anion gap: 5 (ref 5–15)
BUN: 15 mg/dL (ref 6–20)
CO2: 29 mmol/L (ref 22–32)
Calcium: 9 mg/dL (ref 8.9–10.3)
Chloride: 104 mmol/L (ref 98–111)
Creatinine, Ser: 1.03 mg/dL (ref 0.61–1.24)
GFR, Estimated: >60 mL/min (ref >60)
Glucose, Bld: 96 mg/dL (ref 70–99)
Potassium: 4.3 mmol/L (ref 3.5–5.1)
Sodium: 138 mmol/L (ref 135–145)
Total Bilirubin: 0.6 mg/dL (ref 0.3–1.2)
Total Protein: 6.6 g/dL (ref 6.5–8.1)

## 2021-09-09 LAB — I-STAT CHEM 8, ED
BUN: 18 mg/dL (ref 6–20)
Calcium, Ion: 1.21 mmol/L (ref 1.15–1.40)
Chloride: 100 mmol/L (ref 98–111)
Creatinine, Ser: 0.9 mg/dL (ref 0.61–1.24)
Glucose, Bld: 92 mg/dL (ref 70–99)
HCT: 45 % (ref 39.0–52.0)
Hemoglobin: 15.3 g/dL (ref 13.0–17.0)
Potassium: 4 mmol/L (ref 3.5–5.1)
Sodium: 140 mmol/L (ref 135–145)
TCO2: 31 mmol/L (ref 22–32)

## 2021-09-09 LAB — RESP PANEL BY RT-PCR (FLU A&B, COVID) ARPGX2
Influenza A by PCR: NEGATIVE
Influenza B by PCR: NEGATIVE
SARS Coronavirus 2 by RT PCR: NEGATIVE

## 2021-09-09 LAB — CBC
HCT: 45.1 % (ref 39.0–52.0)
Hemoglobin: 15.1 g/dL (ref 13.0–17.0)
MCH: 28.2 pg (ref 26.0–34.0)
MCHC: 33.5 g/dL (ref 30.0–36.0)
MCV: 84.3 fL (ref 80.0–100.0)
Platelets: 172 10*3/uL (ref 150–400)
RBC: 5.35 MIL/uL (ref 4.22–5.81)
RDW: 13.4 % (ref 11.5–15.5)
WBC: 10.2 10*3/uL (ref 4.0–10.5)
nRBC: 0 % (ref 0.0–0.2)

## 2021-09-09 LAB — PROTIME-INR
INR: 1.1 (ref 0.8–1.2)
Prothrombin Time: 13.7 seconds (ref 11.4–15.2)

## 2021-09-09 LAB — ETHANOL: Alcohol, Ethyl (B): <10 mg/dL (ref <10)

## 2021-09-09 LAB — SAMPLE TO BLOOD BANK

## 2021-09-09 MED ORDER — FENTANYL CITRATE PF 50 MCG/ML IJ SOSY
50.0000 ug | PREFILLED_SYRINGE | Freq: Once | INTRAMUSCULAR | Status: AC
  Administered 2021-09-09: 50 ug via INTRAMUSCULAR

## 2021-09-09 MED ORDER — TETANUS-DIPHTHERIA TOXOIDS TD 5-2 LFU IM INJ: 0.5000 mL | INJECTION | Freq: Once | INTRAMUSCULAR | Status: DC

## 2021-09-09 MED ORDER — FENTANYL CITRATE PF 50 MCG/ML IJ SOSY
PREFILLED_SYRINGE | INTRAMUSCULAR | Status: AC
  Filled 2021-09-09: qty 1

## 2021-09-09 MED ORDER — FENTANYL CITRATE PF 50 MCG/ML IJ SOSY
50.0000 ug | PREFILLED_SYRINGE | Freq: Once | INTRAMUSCULAR | Status: AC
  Administered 2021-09-09: 50 ug via INTRAVENOUS
  Filled 2021-09-09: qty 1

## 2021-09-09 MED ORDER — IOHEXOL 350 MG/ML SOLN
100.0000 mL | Freq: Once | INTRAVENOUS | Status: AC | PRN
  Administered 2021-09-09: 100 mL via INTRAVENOUS

## 2021-09-09 MED ORDER — TETANUS-DIPHTH-ACELL PERTUSSIS 5-2.5-18.5 LF-MCG/0.5 IM SUSY
0.5000 mL | PREFILLED_SYRINGE | Freq: Once | INTRAMUSCULAR | Status: AC
  Administered 2021-09-09: 0.5 mL via INTRAMUSCULAR
  Filled 2021-09-09: qty 0.5

## 2021-09-09 MED ORDER — MORPHINE SULFATE (PF) 4 MG/ML IV SOLN
4.0000 mg | Freq: Once | INTRAVENOUS | Status: AC
  Administered 2021-09-09: 4 mg via INTRAVENOUS
  Filled 2021-09-09: qty 1
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
## 2021-09-09 NOTE — ED Notes (Addendum)
Per ct staff pt PIV upper posterior arm that was placed by ED provider has just infiltrated. Provider made aware ?

## 2021-09-09 NOTE — ED Notes (Signed)
Provider made aware of pt continuing to c/o 8/10 pain at this time ?

## 2021-09-09 NOTE — ED Notes (Signed)
Provider at bedside to attempt a new [place] PIV at this time. Radiology remains at bedside ?
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
## 2021-09-09 NOTE — ED Notes (Signed)
Pt transported to ct with this rn at bedside ?

## 2021-09-09 NOTE — ED Notes (Signed)
Returned from ct at this time. [facility] at bedside at this time. Pt is c/o 8/10 pain. Provider made aware ?
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
## 2021-09-09 NOTE — ED Notes (Signed)
Pt requesting head of bed to be adjusted. Pt sat up in the bed at this time ?

## 2021-09-09 NOTE — ED Notes (Signed)
Pt moved to ct at this time ?

## 2021-09-09 NOTE — ED Notes (Signed)
Pt resting quietly at this time

## 2021-09-09 NOTE — ED Notes (Signed)
Pt c/o difficulty breathing once moved on to the table, provider made aware and at bedside. O2 sats in the high 87. Pt now placed on [place] 4lpm at this time.  ?

## 2021-09-09 NOTE — ED Notes (Signed)
Radiology at bedside at this time.

## 2021-09-09 NOTE — ED Notes (Signed)
Provided a cup of water at this time ?

## 2021-09-09 NOTE — ED Notes (Signed)
Provider at bedside trying for an [place] PIV at this time ?

## 2021-09-09 NOTE — ED Notes (Signed)
Provider at bedside at this time

## 2021-09-09 NOTE — ED Notes (Signed)
Delay in ct and lab work due to PIV access. ?

## 2021-09-09 NOTE — ED Notes (Signed)
Spoke with ortho tech reference knee splint. Advised he would be here shortly to apply the knee splint and would need to ambulate ?

## 2021-09-09 NOTE — ED Notes (Signed)
Provider at bedside. Per provider decreased O2 and turned it off at this time ?

## 2021-09-09 NOTE — Progress Notes (Signed)
?   09/09/21 1920  ?Clinical Encounter Type  ?Visited With Patient not available  ?Visit Type Initial;Trauma  ?Referral From Nurse  ?Consult/Referral To Chaplain  ? ?[place] responded. The patient is being attended to by the medical team. There is no support person present. Chaplain remains available for additional support as needed. This note was prepared by Jeanine Luz, M.Div..  For questions please contact by phone [phone].   ?
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
## 2021-09-09 NOTE — ED Notes (Signed)
Pt provided pain meds as ordered. At this time he is requesting ice chips or water. Message sent to provider waiting on response ?

## 2021-09-09 NOTE — ED Notes (Signed)
Provider at bedside

## 2021-09-10 ENCOUNTER — Other Ambulatory Visit (HOSPITAL_COMMUNITY): Payer: Self-pay

## 2021-09-10 ENCOUNTER — Telehealth (HOSPITAL_COMMUNITY): Payer: Self-pay | Admitting: Emergency Medicine

## 2021-09-10 ENCOUNTER — Telehealth: Payer: Self-pay | Admitting: *Deleted

## 2021-09-10 MED ORDER — OXYCODONE-ACETAMINOPHEN 5-325 MG PO TABS: 1.0000 | ORAL_TABLET | Freq: Four times a day (QID) | ORAL | 0 refills | Status: AC | PRN

## 2021-09-10 MED ORDER — OXYCODONE-ACETAMINOPHEN 5-325 MG PO TABS
1.0000 | ORAL_TABLET | ORAL | 0 refills | Status: DC | PRN
  Filled 2021-09-10: qty 15, 3d supply, fill #0
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
## 2021-09-10 NOTE — Progress Notes (Signed)
Orthopedic Tech Progress Note ?Patient Details:  ?[name] ?November 29, 1990 ?[id number] ? ?Ortho Devices ?Type of Ortho Device: Crutches, Knee Immobilizer ?Ortho Device/Splint Location: lle ?Ortho Device/Splint Interventions: Ordered, Application, Adjustment ?  ?Post Interventions ?Patient Tolerated: Well ?Instructions Provided: Adjustment of device, Care of device, Poper ambulation with device ? ?Al Decant ?09/10/2021, 12:22 AM ? ?

## 2021-09-10 NOTE — Telephone Encounter (Signed)
Pt called regarding which pharmacy Rx was e-scribed to.  RNCM reviewed chart to access After Visit Summary and found that Rx was sent to [facility] on [address].  Advised pt to pick up at their leisure.  ?

## 2021-09-10 NOTE — Telephone Encounter (Signed)
Case management reports that patient's prescription from yesterday was unable to be filled at pharmacy as they were out of medication.  Prescription will be sent to a different pharmacy. ?
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
## 2021-12-21 ENCOUNTER — Other Ambulatory Visit: Payer: Self-pay

## 2021-12-21 ENCOUNTER — Encounter (HOSPITAL_BASED_OUTPATIENT_CLINIC_OR_DEPARTMENT_OTHER): Payer: Self-pay | Admitting: Obstetrics and Gynecology

## 2021-12-21 ENCOUNTER — Emergency Department (HOSPITAL_BASED_OUTPATIENT_CLINIC_OR_DEPARTMENT_OTHER)
Admission: EM | Admit: 2021-12-21 | Discharge: 2021-12-22 | Disposition: A | Discharge status: Home / Self Care | Payer: Self-pay | Attending: Emergency Medicine | Admitting: Emergency Medicine

## 2021-12-21 DIAGNOSIS — L03116 Cellulitis of left lower limb: Secondary | ICD-10-CM | POA: Insufficient documentation

## 2021-12-21 DIAGNOSIS — Z4800 Encounter for change or removal of nonsurgical wound dressing: Secondary | ICD-10-CM | POA: Insufficient documentation
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
## 2021-12-22 ENCOUNTER — Emergency Department (HOSPITAL_BASED_OUTPATIENT_CLINIC_OR_DEPARTMENT_OTHER): Payer: Self-pay

## 2021-12-22 ENCOUNTER — Telehealth: Payer: Self-pay | Admitting: Surgery

## 2021-12-22 LAB — COMPREHENSIVE METABOLIC PANEL
ALT: 70 U/L — ABNORMAL HIGH (ref 0–44)
AST: 50 U/L — ABNORMAL HIGH (ref 15–41)
Albumin: 3.3 g/dL — ABNORMAL LOW (ref 3.5–5.0)
Alkaline Phosphatase: 107 U/L (ref 38–126)
Anion gap: 7 (ref 5–15)
BUN: 12 mg/dL (ref 6–20)
CO2: 28 mmol/L (ref 22–32)
Calcium: 8.2 mg/dL — ABNORMAL LOW (ref 8.9–10.3)
Chloride: 103 mmol/L (ref 98–111)
Creatinine, Ser: 0.82 mg/dL (ref 0.61–1.24)
GFR, Estimated: >60 mL/min (ref >60)
Glucose, Bld: 108 mg/dL — ABNORMAL HIGH (ref 70–99)
Potassium: 3.6 mmol/L (ref 3.5–5.1)
Sodium: 138 mmol/L (ref 135–145)
Total Bilirubin: 0.5 mg/dL (ref 0.3–1.2)
Total Protein: 7.1 g/dL (ref 6.5–8.1)

## 2021-12-22 LAB — CBC WITH DIFFERENTIAL/PLATELET
Abs Immature Granulocytes: 0.03 10*3/uL (ref 0.00–0.07)
Basophils Absolute: 0 10*3/uL (ref 0.0–0.1)
Basophils Relative: 1 %
Eosinophils Absolute: 0.3 10*3/uL (ref 0.0–0.5)
Eosinophils Relative: 7 %
HCT: 39.9 % (ref 39.0–52.0)
Hemoglobin: 13.4 g/dL (ref 13.0–17.0)
Immature Granulocytes: 1 %
Lymphocytes Relative: 35 %
Lymphs Abs: 1.7 10*3/uL (ref 0.7–4.0)
MCH: 27.3 pg (ref 26.0–34.0)
MCHC: 33.6 g/dL (ref 30.0–36.0)
MCV: 81.4 fL (ref 80.0–100.0)
Monocytes Absolute: 0.3 10*3/uL (ref 0.1–1.0)
Monocytes Relative: 6 %
Neutro Abs: 2.5 10*3/uL (ref 1.7–7.7)
Neutrophils Relative %: 50 %
Platelets: 198 10*3/uL (ref 150–400)
RBC: 4.9 MIL/uL (ref 4.22–5.81)
RDW: 13.4 % (ref 11.5–15.5)
WBC: 4.9 10*3/uL (ref 4.0–10.5)
nRBC: 0 % (ref 0.0–0.2)

## 2021-12-22 LAB — TROPONIN I (HIGH SENSITIVITY): Troponin I (High Sensitivity): 3 ng/L (ref <18)

## 2021-12-22 LAB — BRAIN NATRIURETIC PEPTIDE: B Natriuretic Peptide: 52.7 pg/mL (ref 0.0–100.0)

## 2021-12-22 MED ORDER — DOXYCYCLINE HYCLATE 100 MG PO TABS
100.0000 mg | ORAL_TABLET | Freq: Once | ORAL | Status: AC
  Administered 2021-12-22: 100 mg via ORAL
  Filled 2021-12-22: qty 1

## 2021-12-22 MED ORDER — POTASSIUM CHLORIDE CRYS ER 20 MEQ PO TBCR: 20.0000 meq | EXTENDED_RELEASE_TABLET | Freq: Every day | ORAL | 0 refills | Status: DC

## 2021-12-22 MED ORDER — DOXYCYCLINE HYCLATE 100 MG PO CAPS: 100.0000 mg | ORAL_CAPSULE | Freq: Two times a day (BID) | ORAL | 0 refills | Status: DC

## 2021-12-22 MED ORDER — FUROSEMIDE 20 MG PO TABS: 20.0000 mg | ORAL_TABLET | Freq: Every day | ORAL | 0 refills | Status: DC

## 2021-12-22 NOTE — ED Notes (Signed)
Wrapped lower left leg with kling then a ace wrap
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
## 2021-12-22 NOTE — Telephone Encounter (Signed)
ED RNCM attempted to contact patient to discuss medication assistance no answer, NVM option.  RNCM will make a 2nd attempt 8/10

## 2022-04-02 ENCOUNTER — Emergency Department: Payer: Medicaid Other

## 2022-04-02 ENCOUNTER — Emergency Department
Admission: EM | Admit: 2022-04-02 | Discharge: 2022-04-02 | Discharge status: Against Medical Advice | Payer: Medicaid Other | Attending: Emergency Medicine | Admitting: Emergency Medicine

## 2022-04-02 ENCOUNTER — Other Ambulatory Visit: Payer: Self-pay

## 2022-04-02 DIAGNOSIS — L03116 Cellulitis of left lower limb: Secondary | ICD-10-CM

## 2022-04-02 DIAGNOSIS — R609 Edema, unspecified: Secondary | ICD-10-CM

## 2022-04-02 DIAGNOSIS — R600 Localized edema: Secondary | ICD-10-CM

## 2022-04-02 MED ORDER — FUROSEMIDE 20 MG PO TABS: 20.0000 mg | ORAL_TABLET | Freq: Every day | ORAL | 0 refills | Status: DC

## 2022-04-02 MED ORDER — SULFAMETHOXAZOLE-TRIMETHOPRIM 800-160 MG PO TABS: 1.0000 | ORAL_TABLET | Freq: Once | ORAL | Status: DC

## 2022-04-02 MED ORDER — SULFAMETHOXAZOLE-TRIMETHOPRIM 800-160 MG PO TABS: 1.0000 | ORAL_TABLET | Freq: Two times a day (BID) | ORAL | 0 refills | Status: AC

## 2022-04-02 MED ORDER — DOXYCYCLINE MONOHYDRATE 100 MG PO TABS: 100.0000 mg | ORAL_TABLET | Freq: Two times a day (BID) | ORAL | 0 refills | Status: AC

## 2022-04-02 MED ORDER — DOXYCYCLINE HYCLATE 100 MG PO TABS: 100.0000 mg | ORAL_TABLET | Freq: Once | ORAL | Status: DC

## 2022-04-02 NOTE — Discharge Instructions (Addendum)
Take Doxycycline twice daily for seven days.  Take Bactrim twice daily for seven days.

## 2022-04-02 NOTE — ED Notes (Signed)
Lab called and advised all the blood they received is hemolyzed and will have to be recollected.

## 2022-04-02 NOTE — ED Triage Notes (Signed)
Pt reports swelling tp his legs and now some weeping from the swelling. Pt also reports has some sores that are not healing as well.

## 2022-04-02 NOTE — ED Notes (Signed)
This RN attempted multiple time for IV as well. Unsuccessful.

## 2022-04-02 NOTE — ED Notes (Signed)
Pt to ED for weeping cellulitis to RLE and cellulitis to LLE since several months. Legs are red and swollen.
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
## 2022-04-02 NOTE — ED Notes (Signed)
Unable to obtain bloodwork. Looked with u/s. 2 Rns have tried/looked.

## 2022-04-02 NOTE — ED Notes (Signed)
Per IV team a line was attempted but unsuccessful, no further blood drawn. EDP aware.

## 2022-04-02 NOTE — ED Notes (Signed)
IV team at bedside 

## 2022-06-10 ENCOUNTER — Emergency Department
Admission: EM | Admit: 2022-06-10 | Discharge: 2022-06-10 | Disposition: A | Discharge status: Home / Self Care | Payer: Medicaid Other | Attending: Emergency Medicine | Admitting: Emergency Medicine

## 2022-06-10 ENCOUNTER — Other Ambulatory Visit: Payer: Self-pay

## 2022-06-10 DIAGNOSIS — M79605 Pain in left leg: Secondary | ICD-10-CM | POA: Diagnosis present

## 2022-06-10 DIAGNOSIS — L03116 Cellulitis of left lower limb: Secondary | ICD-10-CM | POA: Insufficient documentation

## 2022-06-10 LAB — CBC WITH DIFFERENTIAL/PLATELET
Abs Immature Granulocytes: 0.01 10*3/uL (ref 0.00–0.07)
Basophils Absolute: 0 10*3/uL (ref 0.0–0.1)
Basophils Relative: 1 %
Eosinophils Absolute: 0.4 10*3/uL (ref 0.0–0.5)
Eosinophils Relative: 8 %
HCT: 42.4 % (ref 39.0–52.0)
Hemoglobin: 13.7 g/dL (ref 13.0–17.0)
Immature Granulocytes: 0 %
Lymphocytes Relative: 27 %
Lymphs Abs: 1.3 10*3/uL (ref 0.7–4.0)
MCH: 28.1 pg (ref 26.0–34.0)
MCHC: 32.3 g/dL (ref 30.0–36.0)
MCV: 86.9 fL (ref 80.0–100.0)
Monocytes Absolute: 0.3 10*3/uL (ref 0.1–1.0)
Monocytes Relative: 6 %
Neutro Abs: 2.9 10*3/uL (ref 1.7–7.7)
Neutrophils Relative %: 58 %
Platelets: 148 10*3/uL — ABNORMAL LOW (ref 150–400)
RBC: 4.88 MIL/uL (ref 4.22–5.81)
RDW: 13.5 % (ref 11.5–15.5)
WBC: 5 10*3/uL (ref 4.0–10.5)
nRBC: 0 % (ref 0.0–0.2)

## 2022-06-10 LAB — COMPREHENSIVE METABOLIC PANEL
ALT: 265 U/L — ABNORMAL HIGH (ref 0–44)
AST: 131 U/L — ABNORMAL HIGH (ref 15–41)
Albumin: 3.5 g/dL (ref 3.5–5.0)
Alkaline Phosphatase: 108 U/L (ref 38–126)
Anion gap: 7 (ref 5–15)
BUN: 14 mg/dL (ref 6–20)
CO2: 28 mmol/L (ref 22–32)
Calcium: 8.3 mg/dL — ABNORMAL LOW (ref 8.9–10.3)
Chloride: 101 mmol/L (ref 98–111)
Creatinine, Ser: 0.8 mg/dL (ref 0.61–1.24)
GFR, Estimated: >60 mL/min (ref >60)
Glucose, Bld: 154 mg/dL — ABNORMAL HIGH (ref 70–99)
Potassium: 4 mmol/L (ref 3.5–5.1)
Sodium: 136 mmol/L (ref 135–145)
Total Bilirubin: 0.5 mg/dL (ref 0.3–1.2)
Total Protein: 7 g/dL (ref 6.5–8.1)

## 2022-06-10 LAB — LACTIC ACID, PLASMA: Lactic Acid, Venous: 1.5 mmol/L (ref 0.5–1.9)

## 2022-06-10 MED ORDER — SULFAMETHOXAZOLE-TRIMETHOPRIM 800-160 MG PO TABS: 1.0000 | ORAL_TABLET | Freq: Two times a day (BID) | ORAL | 0 refills | Status: AC

## 2022-06-10 MED ORDER — SULFAMETHOXAZOLE-TRIMETHOPRIM 800-160 MG PO TABS
1.0000 | ORAL_TABLET | Freq: Once | ORAL | Status: AC
  Administered 2022-06-10: 1 via ORAL
  Filled 2022-06-10: qty 1

## 2022-06-10 NOTE — ED Provider Notes (Signed)
[facility] Provider Note    Event Date/Time   First MD Initiated Contact with Patient 06/10/22 [phone]     (approximate)   History   Leg Pain   HPI  [name] is a 32 y.o. male   Past medical history of diverticulitis, substance use, bipolar, chronic hepatitis C who presents to the emergency department with left leg infection.  Cellulitic changes occurred several weeks ago that nearly resolved with oral antibiotics but has recurred over the last several days.  No systemic symptoms like fevers chills.  No other acute medical complaints.   External Medical Documents Reviewed: [phone] emergency department visit with left leg cellulitis      Physical Exam   Triage Vital Signs: ED Triage Vitals  Enc Vitals Group     BP 06/10/22 0744 135/83     Pulse Rate 06/10/22 0743 (!) 118     Resp 06/10/22 0743 18     Temp 06/10/22 0743 97.9 F (36.6 C)     Temp Source 06/10/22 0743 Oral     SpO2 06/10/22 0743 97 %     Weight 06/10/22 0926 (!) 319 lb 10.7 oz (145 kg)     Height 06/10/22 0926 6\' 1"  (1.854 m)     Head Circumference --      Peak Flow --      Pain Score 06/10/22 0743 8     Pain Loc --      Pain Edu? --      Excl. in [place]? --     Most recent vital signs: Vitals:   06/10/22 0743 06/10/22 0744  BP:  135/83  Pulse: (!) 118   Resp: 18   Temp: 97.9 F (36.6 C)   SpO2: 97%     General: Awake, no distress.  CV:  Good peripheral perfusion.  Resp:  Normal effort.  Abd:  No distention.  Other:  Awake alert oriented with cellulitic changes to the left lower leg without crepitus or pain out of proportion neurovascular intact.  Abdomen soft and nontender deep palpation all quadrants.  Afebrile comfortable nontoxic appearance.   ED Results / Procedures / Treatments   Labs (all labs ordered are listed, but only abnormal results are displayed) Labs Reviewed  COMPREHENSIVE METABOLIC PANEL - Abnormal; Notable for the following components:       Result Value   Glucose, Bld 154 (*)    Calcium 8.3 (*)    AST 131 (*)    ALT 265 (*)    All other components within normal limits  CBC WITH DIFFERENTIAL/PLATELET - Abnormal; Notable for the following components:   Platelets 148 (*)    All other components within normal limits  LACTIC ACID, PLASMA  LACTIC ACID, PLASMA     I ordered and reviewed the above labs they are notable for liver enzymes are elevated ALT 200 and AST 100, elevated in the past but not to this degree.  White blood cell count within normal limits.  Lactic within normal limits.    PROCEDURES:  Critical Care performed: No  Procedures   MEDICATIONS ORDERED IN ED: Medications  sulfamethoxazole-trimethoprim (BACTRIM DS) 800-160 MG per tablet 1 tablet (has no administration in time range)     IMPRESSION / MDM / ASSESSMENT AND PLAN / ED COURSE  I reviewed the triage vital signs and the nursing notes.  Patient's presentation is most consistent with acute presentation with potential threat to life or bodily function.  Differential diagnosis includes, but is not limited to, cellulitis, abscess, necrotizing fasciitis less likely, liver enzymes elevated considered biliary/acute hepatitis but less likely given benign abdominal exam no nausea vomiting or abdominal pain.   MDM: This is a patient with cellulitic changes to the left leg without systemic symptoms that did well to oral antibiotics previously.  Will prescribe a longer course of oral antibiotics, referral to wound care clinic, and I advised that he follow-up with primary doctor for elevated liver enzymes that are currently asymptomatic so I doubt emergent intra-abdominal pathologies cholecystitis Cholangitis , choledocholithiasis he understands that he must follow-up to recheck these enzymes and return if he develops any symptoms.  I considered hospitalization for admission or observation but given no systemic symptoms and  response to oral antibiotics previously, plan for wound care clinic referral, I think outpatient management is most appropriate at this time but he understands he will return to the emergency department if he develops worsening or new symptoms.        FINAL CLINICAL IMPRESSION(S) / ED DIAGNOSES   Final diagnoses:  Cellulitis of left lower extremity     Rx / DC Orders   ED Discharge Orders          Ordered    sulfamethoxazole-trimethoprim (BACTRIM DS) 800-160 MG tablet  2 times daily        06/10/22 0944    Ambulatory referral to Wound Clinic        06/10/22 0945             Note:  This document was prepared using Dragon voice recognition software and may include unintentional dictation errors.    Lucillie Garfinkel, MD 06/10/22 [phone]

## 2022-06-10 NOTE — Discharge Instructions (Signed)
Take antibiotics for the full course as prescribed. Take Tylenol 650 mg every 6 hours for pain as needed.  Establish care with a primary doctor by calling one of the numbers listed below and have them recheck your liver tests which were slightly elevated today.  Wound care clinic should be giving you a call for follow-up for your leg wounds and infection.  Thank you for choosing [place] for your health care today!  Please see your primary doctor this week for a follow up appointment.   Sometimes, in the early stages of certain disease courses it is difficult to detect in the emergency department evaluation -- so, it is important that you continue to monitor your symptoms and call your doctor right away or return to the emergency department if you develop any new or worsening symptoms.  Please go to the following website to schedule new (and existing) patient appointments:   [email or web address]  If you do not have a primary doctor try calling the following clinics to establish care:  If you have insurance:  [facility] [phone] [place] [address]   [facility]  [phone] [place] [place] [address]   If you do not have insurance:  [facility]  [phone] [address] [place] [address]   The following is another list of primary care offices in the area who are accepting new patients at this time.  Please reach out to one of them directly and let them know you would like to schedule an appointment to follow up on an Emergency Department visit, and/or to establish a new primary care provider (PCP).  There are likely other primary care clinics in the are who are accepting new patients, but this is an excellent place to start:  [place] physician: Dr Lavon Paganini [address] [place], [place] [address] [phone]  [facility] Lead Physician: Dr Steele Sizer [address], [place], [place] [address] [phone]  [place] Physician: Dr Park Liter [address] [place], [place] [address] [phone]  [facility] Lead Physician: Dr Dewaine Oats [place], [place], [place] [address] [phone]  [facility] Physician: Dr Halina Maidens [address] [place], [place] [address] [phone]   It was my pleasure to care for you today.   Hoover Brunette Jacelyn Grip, MD

## 2022-06-10 NOTE — ED Triage Notes (Addendum)
Pt to ED via POV from home. Pt reports constant left leg pain and swelling. Pt also reports sores that are not healing. Pt recently seen for cellulitis of both legs. Pt states they wanted him to stay last time but they couldn't get an IV so they prescribed PO antibiotics.

## 2022-06-30 ENCOUNTER — Ambulatory Visit: Payer: Medicaid Other | Admitting: Physician Assistant

## 2022-07-21 ENCOUNTER — Ambulatory Visit: Payer: Medicaid Other | Admitting: Internal Medicine

## 2022-07-22 ENCOUNTER — Ambulatory Visit: Payer: Medicaid Other | Admitting: Internal Medicine

## 2022-07-25 ENCOUNTER — Ambulatory Visit: Payer: Medicaid Other | Admitting: Internal Medicine
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
## 2022-08-19 ENCOUNTER — Encounter: Payer: Medicaid Other | Attending: Physician Assistant | Admitting: Physician Assistant

## 2022-08-19 DIAGNOSIS — L97822 Non-pressure chronic ulcer of other part of left lower leg with fat layer exposed: Secondary | ICD-10-CM | POA: Diagnosis not present

## 2022-08-19 DIAGNOSIS — L97812 Non-pressure chronic ulcer of other part of right lower leg with fat layer exposed: Secondary | ICD-10-CM | POA: Insufficient documentation

## 2022-08-19 DIAGNOSIS — I87333 Chronic venous hypertension (idiopathic) with ulcer and inflammation of bilateral lower extremity: Secondary | ICD-10-CM | POA: Diagnosis present

## 2022-08-19 DIAGNOSIS — F17218 Nicotine dependence, cigarettes, with other nicotine-induced disorders: Secondary | ICD-10-CM | POA: Diagnosis not present

## 2022-08-19 NOTE — Progress Notes (Signed)
ARKAN, BLOOD ([id number]) [phone].pdf Page 1 of 10 Visit Report for 08/19/2022 Allergy List Details Patient Name: Date of Service: [name], [name] 08/19/2022 8:30 A M Medical Record Number: [id number] Patient Account Number: [id number] Date of Birth/Sex: Treating RN: 03/12/1991 (32 y.o. [name], John Primary Care [name]: PA [name], [place] Other Clinician: Referring [name]: Treating [name]: [name] Weeks in Treatment: 0 Allergies Active Allergies Keflex Reaction: hives, trouble breathing Severity: Moderate Allergy Notes Electronic Signature(s) Signed: 08/19/2022 11:20:35 AM By: Bonnell Public Entered By: Bonnell Public on 08/19/2022 09:19:10 -------------------------------------------------------------------------------- Arrival Information Details Patient Name: Date of Service: [name] 08/19/2022 8:30 A M Medical Record Number: [id number] Patient Account Number: [id number] Date of Birth/Sex: Treating RN: 1990/11/28 (32 y.o. [name], John Primary Care [name]: PA [name], [place] Other Clinician: Referring [name]: Treating [name]: [name] in Treatment: 0 Visit Information Patient Arrived: Ambulatory Arrival Time: 08:49 Accompanied By: girlfriend Transfer Assistance: None Patient Identification Verified: Yes Secondary Verification Process Completed: Yes Patient Requires Transmission-Based Precautions: No Patient Has Alerts: No Electronic Signature(s) Signed: 08/19/2022 11:20:35 AM By: Bonnell Public Entered By: Bonnell Public on 08/19/2022 08:53:32 -------------------------------------------------------------------------------- Clinic Level of Care Assessment Details Patient Name: Date of Service: [name], [name] 08/19/2022 8:30 A M Medical Record Number: [id number] Patient Account Number: [id number] Date of Birth/Sex: Treating RN: 15-Feb-1991 (32 y.o. [name], John Primary Care  [name]: PA [name], [place] Other Clinician: Referring [name]: Treating [name]/Extender: [name] in Treatment: 0 Clinic Level of Care Assessment Items TOOL 2 Quantity Score X- 1 0 Use when only an EandM is performed on the INITIAL visit ASSESSMENTS - Nursing Assessment / Reassessment X- 1 20 General Physical Exam (combine w/ comprehensive assessment (listed just below) when performed on new pt. [address]LENDAL, FURNAS ([id number]) [id number] Page 2 of 10 X- 1 25 Comprehensive Assessment (HX, ROS, Risk Assessments, Wounds Hx, etc.) ASSESSMENTS - Wound and Skin A ssessment / Reassessment []  - 0 Simple Wound Assessment / Reassessment - one wound X- 1 5 Complex Wound Assessment / Reassessment - multiple wounds []  - 0 Dermatologic / Skin Assessment (not related to wound area) ASSESSMENTS - Ostomy and/or Continence Assessment and Care []  - 0 Incontinence Assessment and Management []  - 0 Ostomy Care Assessment and Management (repouching, etc.) PROCESS - Coordination of Care []  - 0 Simple Patient / Family Education for ongoing care X- 1 20 Complex (extensive) Patient / Family Education for ongoing care X- 1 10 Staff obtains Chiropractor, Records, T Results / Process Orders est []  - 0 Staff telephones HHA, Nursing Homes / Clarify orders / etc []  - 0 Routine Transfer to another Facility (non-emergent condition) []  - 0 Routine Hospital Admission (non-emergent condition) X- 1 15 New Admissions / Manufacturing engineer / Ordering NPWT Apligraf, etc. , []  - 0 Emergency Hospital Admission (emergent condition) X- 1 10 Simple Discharge Coordination []  - 0 Complex (extensive) Discharge Coordination PROCESS - Special Needs []  - 0 Pediatric / Minor Patient Management []  - 0 Isolation Patient Management []  - 0 Hearing / Language / Visual special needs []  - 0 Assessment of Community assistance (transportation, D/C planning, etc.) []  -  0 Additional assistance / Altered mentation []  - 0 Support Surface(s) Assessment (bed, cushion, seat, etc.) INTERVENTIONS - Wound Cleansing / Measurement X- 1 5 Wound Imaging (photographs - any number of wounds) []  - 0 Wound Tracing (instead of photographs) []  - 0 Simple Wound Measurement - one wound X- 1 5  Complex Wound Measurement - multiple wounds []  - 0 Simple Wound Cleansing - one wound X- 1 5 Complex Wound Cleansing - multiple wounds INTERVENTIONS - Wound Dressings X - Small Wound Dressing one or multiple wounds 1 10 []  - 0 Medium Wound Dressing one or multiple wounds []  - 0 Large Wound Dressing one or multiple wounds []  - 0 Application of Medications - injection INTERVENTIONS - Miscellaneous []  - 0 External ear exam []  - 0 Specimen Collection (cultures, biopsies, blood, body fluids, etc.) []  - 0 Specimen(s) / Culture(s) sent or taken to Lab for analysis []  - 0 Patient Transfer (multiple staff / Michiel SitesHoyer Lift / Similar devices) []  - 0 Simple Staple / Suture removal (25 or less) [name], [name] ([id number]) [id number] Page 3 of 10 []  - 0 Complex Staple / Suture removal (26 or more) []  - 0 Hypo / Hyperglycemic Management (close monitor of Blood Glucose) []  - 0 Ankle / Brachial Index (ABI) - do not check if billed separately Has the patient been seen at the hospital within the last three years: Yes Total Score: 130 Level Of Care: New/Established - Level 4 Electronic Signature(s) Signed: 08/19/2022 11:20:35 AM By: Bonnell Publicoulter, John Entered By: Bonnell Publicoulter, John on 08/19/2022 09:50:13 -------------------------------------------------------------------------------- Encounter Discharge Information Details Patient Name: Date of Service: John ModyMITCHELL, [name]. 08/19/2022 8:30 A M Medical Record Number: [id number] Patient Account Number: [id number] Date of Birth/Sex: Treating RN: 07/11/1990 (32 y.o. John AbuM) Williamson, John Primary Care [name]: PA John [place]IENT, [place] Other  Clinician: Referring Allante Whitmire: Treating Christropher Gintz/Extender: John ClinesStone, Hoyt Wong, Silas Weeks in Treatment: 0 Encounter Discharge Information Items Discharge Condition: Stable Ambulatory Status: Ambulatory Discharge Destination: Home Transportation: Private Auto Accompanied By: girlfriend Schedule Follow-up Appointment: Yes Clinical Summary of Care: Electronic Signature(s) Signed: 08/19/2022 11:20:35 AM By: Bonnell Publicoulter, John Entered By: Bonnell Publicoulter, John on 08/19/2022 10:07:09 -------------------------------------------------------------------------------- Lower Extremity Assessment Details Patient Name: Date of Service: John ModyMITCHELL, [name]. 08/19/2022 8:30 A M Medical Record Number: [id number] Patient Account Number: [id number] Date of Birth/Sex: Treating RN: 11/19/1990 (32 y.o. John AbuM) Williamson, John Primary Care Karolee Meloni: [place], [place] Other Clinician: Referring Thaddeus Evitts: Treating Caylyn Tedeschi/Extender: John ClinesStone, Hoyt Wong, Silas Weeks in Treatment: 0 Edema Assessment Assessed: [Left: No] [Right: No] Edema: [Left: Yes] [Right: Yes] Calf Left: Right: Point of Measurement: 39 cm From Medial Instep 50.5 cm 48.5 cm Ankle Left: Right: Point of Measurement: 11 cm From Medial Instep 30 cm 30 cm Vascular Assessment Pulses: Dorsalis Pedis Palpable: [Left:Yes] [Right:Yes] Doppler Audible: [Left:Yes] Posterior Tibial Palpable: [Left:Yes] [Right:Yes] Blood Pressure: Brachial: [Left:135] [name], Gemini Williamson ([id number]) [Right:[id number] Page 4 of 10] Ankle: [Left:Posterior Tibial: 147 1.09] [Right:Posterior Tibial: 149 1.10] Electronic Signature(s) Signed: 08/19/2022 11:20:35 AM By: Bonnell Publicoulter, John Entered By: Bonnell Publicoulter, John on 08/19/2022 09:38:46 -------------------------------------------------------------------------------- Multi Wound Chart Details Patient Name: Date of Service: John ModyMITCHELL, [name]. 08/19/2022 8:30 A M Medical Record Number: [id number] Patient Account Number:  [id number] Date of Birth/Sex: Treating RN: 04/17/1991 (32 y.o. John AbuM) Williamson, John Primary Care Shalia Bartko: PA [place], [place] Other Clinician: Referring Monserrat Vidaurri: Treating Mystery Schrupp/Extender: John ClinesStone, Hoyt Wong, Silas Weeks in Treatment: 0 Vital Signs Height(in): 73 Pulse(bpm): 105 Weight(lbs): 321 Blood Pressure(mmHg): 140/84 Body Mass Index(BMI): 42.3 Temperature(F): 98.1 Respiratory Rate(breaths/min): 18 [1:Photos:] Left, Anterior Lower Leg Left, Posterior Lower Leg Right, Posterior Lower Leg Wound Location: Trauma Not Known Gradually Appeared Wounding Event: T be determined o T be determined o Venous Leg Ulcer Primary Etiology: Sleep Apnea, Hepatitis C, Sleep Apnea, Hepatitis C, Sleep Apnea, Hepatitis C, Comorbid History: Confinement Anxiety Confinement Anxiety Confinement Anxiety 09/09/2021 09/09/2021  04/29/2022 Date Acquired: 0 0 0 Weeks of Treatment: Open Open Open Wound Status: No No No Wound Recurrence: 1x1x0.2 2x1x0.2 1x2.5x0.1 Measurements L x W x D (cm) 0.785 1.571 1.963 A (cm) : rea 0.157 0.314 0.196 Volume (cm) : Full Thickness Without Exposed Full Thickness Without Exposed Partial Thickness Classification: Support Structures Support Structures Small Small None Present Exudate Amount: Serous Serous N/A Exudate Type: Media planner N/A Exudate Color: Distinct, outline attached Distinct, outline attached Distinct, outline attached Wound Margin: Large (67-100%) Large (67-100%) None Present (0%) Granulation Amount: Red Red N/A Granulation Quality: Small (1-33%) Small (1-33%) None Present (0%) Necrotic Amount: Fat Layer (Subcutaneous Tissue): Yes Fat Layer (Subcutaneous Tissue): Yes Fascia: No Exposed Structures: Fat Layer (Subcutaneous Tissue): No Tendon: No Muscle: No Joint: No Bone: No Limited to Skin Breakdown None None None Epithelialization: Treatment Notes Electronic Signature(s) Signed: 08/19/2022 11:20:35 AM By: Bonnell Public Entered By:  Bonnell Public on 08/19/2022 09:48:17 Elnita Maxwell ([id number]) [id number] Page 5 of 10 -------------------------------------------------------------------------------- Multi-Disciplinary Care Plan Details Patient Name: Date of Service: [name], [name] 08/19/2022 8:30 A M Medical Record Number: [id number] Patient Account Number: [id number] Date of Birth/Sex: Treating RN: [birth year] (32 y.o. [name], John Primary Care Taelon Bendorf: PA [name], [place] Other Clinician: Referring Jenner Rosier: Treating Stephano Arrants/Extender: [name] in Treatment: 0 Active Inactive Soft Tissue Infection Nursing Diagnoses: Impaired tissue integrity Knowledge deficit related to disease process and management Knowledge deficit related to home infection control: handwashing, handling of soiled dressings, supply storage Goals: Patient will remain free of wound infection Date Initiated: 08/19/2022 Target Resolution Date: 09/19/2022 Goal Status: Active Patient/caregiver will verbalize understanding of or measures to prevent infection and contamination in the home setting Date Initiated: 08/19/2022 Target Resolution Date: 09/19/2022 Goal Status: Active Patient's soft tissue infection will resolve Date Initiated: 08/19/2022 Target Resolution Date: 08/26/2022 Goal Status: Active Signs and symptoms of infection will be recognized early to allow for prompt treatment Date Initiated: 08/19/2022 Target Resolution Date: 08/26/2022 Goal Status: Active Interventions: Assess signs and symptoms of infection every visit Treatment Activities: Systemic antibiotics : 08/19/2022 Notes: Venous Leg Ulcer Nursing Diagnoses: Actual venous Insuffiency (use after diagnosis is confirmed) Knowledge deficit related to disease process and management Goals: Patient will maintain optimal edema control Date Initiated: 08/19/2022 Target Resolution Date: 09/19/2022 Goal Status: Active Patient/caregiver will  verbalize understanding of disease process and disease management Date Initiated: 08/19/2022 Target Resolution Date: 09/19/2022 Goal Status: Active Verify adequate tissue perfusion prior to therapeutic compression application Date Initiated: 08/19/2022 Target Resolution Date: 08/19/2022 Goal Status: Active Interventions: Assess peripheral edema status every visit. Compression as ordered Provide education on venous insufficiency Treatment Activities: Therapeutic compression applied : 08/19/2022 Notes: Wound/Skin Impairment Nursing Diagnoses: Impaired tissue integrity Knowledge deficit related to smoking impact on wound healing Knowledge deficit related to ulceration/compromised skin integrity [name], [name] ([id number]) [phone].pdf Page 6 of 10 Goals: Patient will demonstrate a reduced rate of smoking or cessation of smoking Date Initiated: 08/19/2022 Target Resolution Date: 08/26/2022 Goal Status: Active Ulcer/skin breakdown will have a volume reduction of 30% by week 4 Date Initiated: 08/19/2022 Target Resolution Date: 09/19/2022 Goal Status: Active Ulcer/skin breakdown will have a volume reduction of 50% by week 8 Date Initiated: 08/19/2022 Target Resolution Date: 10/21/2022 Goal Status: Active Ulcer/skin breakdown will have a volume reduction of 80% by week 12 Date Initiated: 08/19/2022 Target Resolution Date: 12/23/2022 Goal Status: Active Interventions: Assess patient/caregiver ability to obtain necessary supplies Assess patient/caregiver ability to perform ulcer/skin care regimen upon admission and  as needed Assess ulceration(s) every visit Provide education on smoking Treatment Activities: Skin care regimen initiated : 08/19/2022 Smoking cessation education : 08/19/2022 Topical wound management initiated : 08/19/2022 Notes: Electronic Signature(s) Signed: 08/19/2022 11:20:35 AM By: Bonnell Publicoulter, John Entered By: Bonnell Publicoulter, John on 08/19/2022  09:55:36 -------------------------------------------------------------------------------- Pain Assessment Details Patient Name: Date of Service: John ModyMITCHELL, [name]. 08/19/2022 8:30 A M Medical Record Number: [id number] Patient Account Number: [id number] Date of Birth/Sex: Treating RN: 02/03/1991 (32 y.o. John AbuM) Williamson, John Primary Care Janeka Libman: PA John [place]IENT, [place] Other Clinician: Referring Lolly Glaus: Treating Kemon Devincenzi/Extender: John ClinesStone, Hoyt Wong, Silas Weeks in Treatment: 0 Active Problems Location of Pain Severity and Description of Pain Patient Has Paino No Site Locations Pain Management and Medication Current Pain Management: Electronic Signature(s) Signed: 08/19/2022 11:20:35 AM By: Raeanne Barryoulter, John [name], [name] ([id number]) [id number] Page 7 of 10 Entered By: Bonnell Publicoulter, John on 08/19/2022 08:53:38 -------------------------------------------------------------------------------- Patient/Caregiver Education Details Patient Name: Date of Service: Ratcliffe, [name]. 4/5/2024andnbsp8:30 A M Medical Record Number: [id number] Patient Account Number: [id number] Date of Birth/Gender: Treating RN: 03/13/1991 (32 y.o. John AbuM) Williamson, John Primary Care Physician: PA John JordanIENT, [place] Other Clinician: Referring Physician: Treating Physician/Extender: John ClinesStone, Hoyt Wong, Silas Weeks in Treatment: 0 Education Assessment Education Provided To: Patient Education Topics Provided Infection: Handouts: Hygiene and Infection Prevention, Other: bactrim Methods: Explain/Verbal Responses: State content correctly Smoking and Wound Healing: Handouts: Smoking and Wound Healing Methods: Explain/Verbal Responses: State content correctly Venous: Handouts: Controlling Swelling with Compression Stockings Methods: Explain/Verbal Responses: State content correctly Electronic Signature(s) Signed: 08/19/2022 11:20:35 AM By: Bonnell Publicoulter, John Entered By: Bonnell Publicoulter, John on 08/19/2022  10:05:53 -------------------------------------------------------------------------------- Wound Assessment Details Patient Name: Date of Service: John ModyMITCHELL, [name]. 08/19/2022 8:30 A M Medical Record Number: [id number] Patient Account Number: [id number] Date of Birth/Sex: Treating RN: 10/20/1990 (32 y.o. John AbuM) Williamson, John Primary Care Bobbiejo Ishikawa: PA John [place]IENT, [place] Other Clinician: Referring Odessa Morren: Treating Thecla Forgione/Extender: John HarrisStone, Hoyt Wong, Silas Weeks in Treatment: 0 Wound Status Wound Number: 1 Primary Etiology: T be determined o Wound Location: Left, Anterior Lower Leg Wound Status: Open Wounding Event: Trauma Comorbid History: Sleep Apnea, Hepatitis C, Confinement Anxiety Date Acquired: 09/09/2021 Weeks Of Treatment: 0 Clustered Wound: No Photos [name], Dalyn Williamson ([id number]) [id number]) [id number] Page 8 of 10 Wound Measurements Length: (cm) 1 Width: (cm) 1 Depth: (cm) 0.2 Area: (cm) 0.785 Volume: (cm) 0.157 % Reduction in Area: % Reduction in Volume: Epithelialization: None Tunneling: No Undermining: No Wound Description Classification: Full Thickness Without Exposed Support Structures Wound Margin: Distinct, outline attached Exudate Amount: Small Exudate Type: Serous Exudate Color: amber Foul Odor After Cleansing: No Slough/Fibrino No Wound Bed Granulation Amount: Large (67-100%) Exposed Structure Granulation Quality: Red Fat Layer (Subcutaneous Tissue) Exposed: Yes Necrotic Amount: Small (1-33%) Necrotic Quality: Adherent Scientist, physiologicallough Electronic Signature(s) Signed: 08/19/2022 11:20:35 AM By: Bonnell Publicoulter, John Entered By: Bonnell Publicoulter, John on 08/19/2022 09:32:20 -------------------------------------------------------------------------------- Wound Assessment Details Patient Name: Date of Service: John ModyMITCHELL, [name]. 08/19/2022 8:30 A M Medical Record Number: [id number] Patient Account Number: [id number] Date of Birth/Sex: Treating RN: 01/16/1991 (32 y.o. John AbuM)  Williamson, John Primary Care Kendre Jacinto: PA TIENT, [place] Other Clinician: Referring Yesenia Fontenette: Treating Xylah Early/Extender: John ClinesStone, Hoyt Wong, Silas Weeks in Treatment: 0 Wound Status Wound Number: 2 Primary Etiology: T be determined o Wound Location: Left, Posterior Lower Leg Wound Status: Open Wounding Event: Not Known Comorbid History: Sleep Apnea, Hepatitis C, Confinement Anxiety Date Acquired: 09/09/2021 Weeks Of Treatment: 0 Clustered Wound: No Photos Wound Measurements Length: (cm) 2 Width: (cm) 1 Depth: (cm) 0.2 Area: (cm) 1.571 Volume: (cm) 0.314 %  Reduction in Area: % Reduction in Volume: Epithelialization: None Tunneling: No Undermining: No Wound Description Classification: Full Thickness Without Exposed Support Structures Wound Margin: Distinct, outline attached Exudate Amount: Small Exudate Type: Serous Exudate Color: amber [name], [name] ([id number]) Foul Odor After Cleansing: No Slough/Fibrino No [phone].pdf Page 9 of 10 Wound Bed Granulation Amount: Large (67-100%) Exposed Structure Granulation Quality: Red Fat Layer (Subcutaneous Tissue) Exposed: Yes Necrotic Amount: Small (1-33%) Necrotic Quality: Adherent Scientist, physiological) Signed: 08/19/2022 11:20:35 AM By: Bonnell Public Entered By: Bonnell Public on 08/19/2022 09:32:47 -------------------------------------------------------------------------------- Wound Assessment Details Patient Name: Date of Service: [name], [name] 08/19/2022 8:30 A M Medical Record Number: [id number] Patient Account Number: [id number] Date of Birth/Sex: Treating RN: 1990-08-17 (32 y.o. [name], John Primary Care Allyah Heather: PA TIENT, [place] Other Clinician: Referring Aviance Cooperwood: Treating Tessah Patchen/Extender: [name] in Treatment: 0 Wound Status Wound Number: 3 Primary Etiology: Venous Leg Ulcer Wound Location: Right, Posterior Lower Leg Wound Status: Open Wounding  Event: Gradually Appeared Comorbid History: Sleep Apnea, Hepatitis C, Confinement Anxiety Date Acquired: 04/29/2022 Weeks Of Treatment: 0 Clustered Wound: No Photos Wound Measurements Length: (cm) 1 Width: (cm) 2.5 Depth: (cm) 0.1 Area: (cm) 1.963 Volume: (cm) 0.196 % Reduction in Area: % Reduction in Volume: Epithelialization: None Tunneling: No Undermining: No Wound Description Classification: Partial Thickness Wound Margin: Distinct, outline attached Exudate Amount: None Present Foul Odor After Cleansing: No Slough/Fibrino No Wound Bed Granulation Amount: None Present (0%) Exposed Structure Necrotic Amount: None Present (0%) Fascia Exposed: No Fat Layer (Subcutaneous Tissue) Exposed: No Tendon Exposed: No Muscle Exposed: No Joint Exposed: No Bone Exposed: No Limited to Skin Breakdown Treatment Notes Wound #3 (Lower Leg) Wound Laterality: Right, Posterior [name], [name] ([id number]) [phone].pdf Page 10 of 10 Soap and Water Discharge Instruction: Gently cleanse wound with antibacterial soap, rinse and pat dry prior to dressing wounds Peri-Wound Care Topical Primary Dressing Silvercel Small 2x2 (in/in) Discharge Instruction: Apply Silvercel Small 2x2 (in/in) as instructed Secondary Dressing Gauze Discharge Instruction: As directed: dry, moistened with saline or moistened with Dakins Solution Secured With Tubigrip Size E, 3.5x10 (in/yds) Discharge Instruction: Apply 3 Tubigrip E 3-finger-widths below knee to base of toes to secure dressing and/or for swelling. Compression Wrap Compression Stockings Add-Ons Electronic Signature(s) Signed: 08/19/2022 11:20:35 AM By: Bonnell Public Entered By: Bonnell Public on 08/19/2022 09:48:06 -------------------------------------------------------------------------------- Vitals Details Patient Name: Date of Service: [name] 08/19/2022 8:30 A M Medical Record Number:  [id number] Patient Account Number: [id number] Date of Birth/Sex: Treating RN: August 22, 1990 (32 y.o. [name], John Primary Care Kaisyn Reinhold: PA TIENT, [place] Other Clinician: Referring Edward Trevino: Treating Izabelle Daus/Extender: [name] in Treatment: 0 Vital Signs Time Taken: 08:52 Temperature (F): 98.1 Height (in): 73 Pulse (bpm): 105 Source: Stated Respiratory Rate (breaths/min): 18 Weight (lbs): 321 Blood Pressure (mmHg): 140/84 Source: Stated Reference Range: 80 - 120 mg / dl Body Mass Index (BMI): 42.3 Electronic Signature(s) Signed: 08/19/2022 11:20:35 AM By: Bonnell Public Entered By: Bonnell Public on 08/19/2022 08:55:22

## 2022-08-19 NOTE — Progress Notes (Signed)
[name], [place] Williamson ([id number]) [phone] Page 1 of 4 Visit Report for 08/19/2022 Abuse Risk Screen Details Patient Name: Date of Service: John ModyMITCHELL, John N Williamson. 08/19/2022 8:30 A M Medical Record Number: [id number] Patient Account Number: [id number] Date of Birth/Sex: Treating RN: 10/04/1990 (32 y.o. [name]) Coulter, Leah Primary Care Wyllow Seigler: PA Zenovia [place], [place] Other Clinician: Referring Ruel Dimmick: Treating Santia Labate/Extender: Metta ClinesStone, Hoyt Wong, Silas Weeks in Treatment: 0 Abuse Risk Screen Items Answer ABUSE RISK SCREEN: Has anyone close to you tried to hurt or harm you recentlyo No Do you feel uncomfortable with anyone in your familyo No Has anyone forced you do things that you didnt want to doo No Electronic Signature(s) Signed: 08/19/2022 11:20:35 AM By: Bonnell Publicoulter, Leah Entered By: Bonnell Publicoulter, Leah on 08/19/2022 09:26:13 -------------------------------------------------------------------------------- Activities of Daily Living Details Patient Name: Date of Service: John ModyMITCHELL, John N Williamson. 08/19/2022 8:30 A M Medical Record Number: [id number] Patient Account Number: [id number] Date of Birth/Sex: Treating RN: 03/12/1991 (32 y.o. [name]) Coulter, Leah Primary Care Cory Kitt: PA Zen[place] Other Clinician: Referring Shanell Aden: Treating Khiara Shuping/Extender: Metta ClinesStone, Hoyt Wong, Silas Weeks in Treatment: 0 Activities of Daily Living Items Answer Activities of Daily Living (Please select one for each item) Drive Automobile Completely Able T Medications ake Completely Able Use T elephone Completely Able Care for Appearance Completely Able Use T oilet Completely Able Bath / Shower Completely Able Dress Self Completely Able Feed Self Completely Able Walk Completely Able Get In / Out Bed Completely Able Housework Completely Able Prepare Meals Completely Able Handle Money Completely Able Shop for Self Completely Able Electronic Signature(s) Signed: 08/19/2022 11:20:35 AM By: Bonnell Publicoulter,  Leah Entered By: Bonnell Publicoulter, Leah on 08/19/2022 09:26:28 -------------------------------------------------------------------------------- Education Screening Details Patient Name: Date of Service: John ModyMITCHELL, John N Williamson. 08/19/2022 8:30 A M Medical Record Number: [id number] Patient Account Number: [id number] Date of Birth/Sex: Treating RN: 06/17/1990 (32 y.o. [name]) Coulter, Leah Primary Care Meredyth Hornung: PA Zenovia [place]IENT, [place] Other Clinician: Referring Leoma Folds: Treating Kynsie Falkner/Extender: Metta ClinesStone, Hoyt Wong, Silas Weeks in Treatment: [address] [name], [name] ([id number]) [id number] Page 2 of 4 Learning Preferences/Education Level/Primary Language Learning Preference: Explanation Highest Education Level: High School Preferred Language: English Cognitive Barrier Language Barrier: No Translator Needed: No Memory Deficit: No Emotional Barrier: No Cultural/Religious Beliefs Affecting Medical Care: No Physical Barrier Impaired Vision: No Impaired Hearing: No Decreased Hand dexterity: No Knowledge/Comprehension Knowledge Level: Medium Comprehension Level: Medium Ability to understand written instructions: Medium Ability to understand verbal instructions: Medium Motivation Anxiety Level: Calm Cooperation: Cooperative Education Importance: Acknowledges Need Interest in Health Problems: Asks Questions Perception: Coherent Willingness to Engage in Self-Management Medium Activities: Readiness to Engage in Self-Management Medium Activities: Electronic Signature(s) Signed: 08/19/2022 11:20:35 AM By: Bonnell Publicoulter, Leah Entered By: Bonnell Publicoulter, Leah on 08/19/2022 09:27:09 -------------------------------------------------------------------------------- Fall Risk Assessment Details Patient Name: Date of Service: John ModyMITCHELL, John N Williamson. 08/19/2022 8:30 A M Medical Record Number: [id number] Patient Account Number: [id number] Date of Birth/Sex: Treating RN: 09/30/1990 (32 y.o. [name]) Coulter, Leah Primary  Care Jeronica Stlouis: PA [place], [place] Other Clinician: Referring Natalyah Cummiskey: Treating Zanae Kuehnle/Extender: Metta ClinesStone, Hoyt Wong, Silas Weeks in Treatment: 0 Fall Risk Assessment Items Have you had 2 or more falls in the last 12 monthso 0 No Have you had any fall that resulted in injury in the last 12 monthso 0 No FALLS RISK SCREEN History of falling - immediate or within 3 months 0 No Secondary diagnosis (Do you have 2 or more medical diagnoseso) 0 No Ambulatory aid None/bed rest/wheelchair/nurse 0 Yes Crutches/cane/walker 0 No Furniture 0 No Intravenous therapy Access/Saline/Heparin Lock 0 No  Gait/Transferring Normal/ bed rest/ wheelchair 0 Yes Weak (short steps with or without shuffle, stooped but able to lift head while walking, may seek 0 No support from furniture) Impaired (short steps with shuffle, may have difficulty arising from chair, head down, impaired 0 No balance) Mental Status Oriented to own ability 0 Yes Overestimates or forgets limitations 0 No Risk Level: Low Risk Score: 0 [name] ([id number]) [phone] [id number].pdf Page 3 of 4 Electronic Signature(s) -------------------------------------------------------------------------------- Foot Assessment Details Patient Name: Date of Service: SEYDOU, ASANO 08/19/2022 8:30 A M Medical Record Number: [id number] Patient Account Number: [id number] Date of Birth/Sex: Treating RN: 1990/09/07 (32 y.o. John Abu, Leah Primary Care Lehi Phifer: PA TIENT, [place] Other Clinician: Referring Muhamed Luecke: Treating Riah Kehoe/Extender: Metta Clines in Treatment: 0 Foot Assessment Items Site Locations + = Sensation present, - = Sensation absent, C = Callus, U = Ulcer R = Redness, W = Warmth, M = Maceration, PU = Pre-ulcerative lesion F = Fissure, S = Swelling, D = Dryness Assessment Right: Left: Other Deformity: Yes Yes Prior Foot Ulcer: No No Prior Amputation: No No Charcot Joint: No No Ambulatory  Status: Gait: Electronic Signature(s) Signed: 08/19/2022 11:20:35 AM By: Bonnell Public Entered By: Bonnell Public on 08/19/2022 09:30:26 -------------------------------------------------------------------------------- Nutrition Risk Screening Details Patient Name: Date of Service: TAISHAUN, GARGAN 08/19/2022 8:30 A M Medical Record Number: [id number] Patient Account Number: [id number] Date of Birth/Sex: Treating RN: 1990/05/31 (32 y.o. John Abu, Leah Primary Care Francisca Langenderfer: PA Zenovia Jordan, [place] Other Clinician: Referring Denys Salinger: Treating Tisha Cline/Extender: Metta Clines in Treatment: 0 Height (in): 73 Weight (lbs): 321 Body Mass Index (BMI): 42.3 LORNA, STAHLBERG ([id number]) [phone] [id number].pdf Page 4 of 4 Nutrition Risk Screening Items Score Screening NUTRITION RISK SCREEN: I have an illness or condition that made me change the kind and/or amount of food I eat 0 No I eat fewer than two meals per day 0 No I eat few fruits and vegetables, or milk products 2 Yes I have three or more drinks of beer, liquor or wine almost every day 0 No I have tooth or mouth problems that make it hard for me to eat 0 No I don't always have enough money to buy the food I need 0 No I eat alone most of the time 0 No I take three or more different prescribed or over-the-counter drugs a day 0 No Without wanting to, I have lost or gained 10 pounds in the last six months 0 No I am not always physically able to shop, cook and/or feed myself 0 No Nutrition Protocols Good Risk Protocol Moderate Risk Protocol High Risk Proctocol Risk Level: Good Risk Score: 2 Electronic Signature(s) Signed: 08/19/2022 11:20:35 AM By: Bonnell Public Entered By: Bonnell Public on 08/19/2022 09:28:20

## 2022-08-19 NOTE — Progress Notes (Addendum)
CANYON, LOHR ([id number]) [id number] Page 1 of 8 Visit Report for 08/19/2022 Chief Complaint Document Details Patient Name: Date of Service: [name], [name] 08/19/2022 8:30 A M Medical Record Number: [id number] Patient Account Number: [id number] Date of Birth/Sex: Treating RN: 06-12-1990 (32 y.o. Barnett Abu, Leah Primary Care Provider: PA Zenovia Jordan, [place] Other Clinician: Referring Provider: Treating Provider/Extender: Metta Clines in Treatment: 0 Information Obtained from: Patient Chief Complaint Bilateral LE Ulcers Electronic Signature(s) Signed: 08/19/2022 9:44:21 AM By: Lenda Kelp PA-C Entered By: Lenda Kelp on 08/19/2022 09:44:21 -------------------------------------------------------------------------------- HPI Details Patient Name: Date of Service: [name], [name] 08/19/2022 8:30 A M Medical Record Number: [id number] Patient Account Number: [id number] Date of Birth/Sex: Treating RN: Dec 21, 1990 (32 y.o. Barnett Abu, Leah Primary Care Provider: PA Zenovia Jordan, [place] Other Clinician: Referring Provider: Treating Provider/Extender: Metta Clines in Treatment: 0 History of Present Illness HPI Description: 08-19-2022 upon evaluation today patient presents for evaluation here in the clinic secondary to issues that has been having with wounds over the bilateral lower extremities. Subsequently the right leg posteriorly actually looks worse than the left although he tells me left leg hurts worse. Fortunately I do not see any signs of systemic infection though locally I definitely believe there is some cellulitis of this right leg posteriorly. He tells me he has been on antibiotics but is currently not on anything at this point. He is allergic to Keflex. With that being said he is having some discomfort bilaterally in his lower extremities. The patient does have a history of being overweight. Otherwise he also does smoke cigarettes  he is also dealing with chronic lower extremity swelling. He does have a job where he stands most of the day. He is not diabetic. Currently he has been using Ace wrap. Electronic Signature(s) Signed: 08/19/2022 1:27:05 PM By: Lenda Kelp PA-C Entered By: Lenda Kelp on 08/19/2022 13:27:05 -------------------------------------------------------------------------------- Physical Exam Details Patient Name: Date of Service: [name], [name] 08/19/2022 8:30 A M Medical Record Number: [id number] Patient Account Number: [id number] Date of Birth/Sex: Treating RN: Jul 11, 1990 (32 y.o. Barnett Abu, Leah Primary Care Provider: PA Zenovia Jordan, [place] Other Clinician: Referring Provider: Treating Provider/Extender: Metta Clines in Treatment: 0 Constitutional patient is hypertensive.. pulse regular and within target range for patient.Marland Kitchen respirations regular, non-labored and within target range for patient.Marland Kitchen temperature within target range for patient.. Obese and well-hydrated in no acute distress. Eyes conjunctiva clear no eyelid edema noted. pupils equal round and reactive to light and accommodation. Ears, Nose, Mouth, and Throat no gross abnormality of ear auricles or external auditory canals. normal hearing noted during conversation. mucus membranes moist. Respiratory CHAEL, URENDA ([id number]) [id number] Page 2 of 8 normal breathing without difficulty. Cardiovascular 2+ dorsalis pedis/posterior tibialis pulses. 2+ pitting edema of the bilateral lower extremities. Musculoskeletal normal gait and posture. no significant deformity or arthritic changes, no loss or range of motion, no clubbing. Psychiatric this patient is able to make decisions and demonstrates good insight into disease process. Alert and Oriented x 3. pleasant and cooperative. Notes Upon inspection patient's wound bed actually showed signs of poor granulation and a lot of inflammation noted  secondary to what appears to be cellulitis of the bilateral lower extremities. There was much more swelling however on the right there also is a lot more erythema that is concerning here for me to be perfectly honest. I do believe that the patient is tolerating the dressing changes with the Ace  wrap at this point but I do not think that this is keeping the edema under good control at this point. I think he needs something more consistent he really needs compression wraps but in the interim right now I think Tubigrip is probably can to be best. Electronic Signature(s) Signed: 08/19/2022 1:28:05 PM By: Lenda Kelp PA-C Entered By: Lenda Kelp on 08/19/2022 13:28:05 -------------------------------------------------------------------------------- Physician Orders Details Patient Name: Date of Service: [name] 08/19/2022 8:30 A M Medical Record Number: [id number] Patient Account Number: [id number] Date of Birth/Sex: Treating RN: 11-26-1990 (32 y.o. Barnett Abu, Leah Primary Care Provider: PA TIENT, [place] Other Clinician: Referring Provider: Treating Provider/Extender: Metta Clines in Treatment: 0 Verbal / Phone Orders: No Diagnosis Coding ICD-10 Coding Code Description [phone] Chronic venous hypertension (idiopathic) with ulcer and inflammation of bilateral lower extremity L97.822 Non-pressure chronic ulcer of other part of left lower leg with fat layer exposed L97.812 Non-pressure chronic ulcer of other part of right lower leg with fat layer exposed F17.218 Nicotine dependence, cigarettes, with other nicotine-induced disorders Follow-up Appointments Return Appointment in 1 week. Wound Treatment Wound #1 - Lower Leg Wound Laterality: Left, Anterior Cleanser: Soap and Water 3 x Per Week/15 Days Discharge Instructions: Gently cleanse wound with antibacterial soap, rinse and pat dry prior to dressing wounds Prim Dressing: Silvercel Small 2x2 (in/in) 3 x Per Week/15  Days ary Discharge Instructions: Apply Silvercel Small 2x2 (in/in) as instructed Secondary Dressing: Gauze 3 x Per Week/15 Days Discharge Instructions: As directed: dry, moistened with saline or moistened with Dakins Solution Secured With: Tubigrip Size E, 3.5x10 (in/yds) 3 x Per Week/15 Days Discharge Instructions: Apply 3 Tubigrip E 3-finger-widths below knee to base of toes to secure dressing and/or for swelling. Wound #2 - Lower Leg Wound Laterality: Left, Posterior Cleanser: Soap and Water 3 x Per Week/15 Days Discharge Instructions: Gently cleanse wound with antibacterial soap, rinse and pat dry prior to dressing wounds Prim Dressing: Silvercel Small 2x2 (in/in) 3 x Per Week/15 Days ary Discharge Instructions: Apply Silvercel Small 2x2 (in/in) as instructed Secondary Dressing: Gauze 3 x Per Week/15 Days Discharge Instructions: As directed: dry, moistened with saline or moistened with Dakins Solution Secured With: Tubigrip Size E, 3.5x10 (in/yds) 3 x Per Week/15 Days Discharge Instructions: Apply 3 Tubigrip E 3-finger-widths below knee to base of toes to secure dressing and/or for swelling. ZIA, KANNER ([id number]) [id number] Page 3 of 8 Wound #3 - Lower Leg Wound Laterality: Right, Posterior Cleanser: Soap and Water 3 x Per Week/15 Days Discharge Instructions: Gently cleanse wound with antibacterial soap, rinse and pat dry prior to dressing wounds Prim Dressing: Silvercel Small 2x2 (in/in) 3 x Per Week/15 Days ary Discharge Instructions: Apply Silvercel Small 2x2 (in/in) as instructed Secondary Dressing: Gauze 3 x Per Week/15 Days Discharge Instructions: As directed: dry, moistened with saline or moistened with Dakins Solution Secured With: Tubigrip Size E, 3.5x10 (in/yds) 3 x Per Week/15 Days Discharge Instructions: Apply 3 Tubigrip E 3-finger-widths below knee to base of toes to secure dressing and/or for swelling. Patient Medications llergies:  Keflex A Notifications Medication Indication Start End 08/19/2022 Bactrim DS DOSE 1 - oral 800 mg-160 mg tablet - 1 tablet oral twice a day x 14 days Electronic Signature(s) Signed: 08/19/2022 10:48:29 AM By: Lenda Kelp PA-C Entered By: Lenda Kelp on 08/19/2022 10:48:28 -------------------------------------------------------------------------------- Problem List Details Patient Name: Date of Service: [name] 08/19/2022 8:30 A M Medical Record Number: [id number] Patient Account Number:  [id number] Date of Birth/Sex: Treating RN: May 20, 1990 (32 y.o. Barnett Abu, Leah Primary Care Provider: PA TIENT, [place] Other Clinician: Referring Provider: Treating Provider/Extender: Metta Clines in Treatment: 0 Active Problems ICD-10 Encounter Code Description Active Date MDM Diagnosis I87.333 Chronic venous hypertension (idiopathic) with ulcer and inflammation of 08/19/2022 No Yes bilateral lower extremity L97.822 Non-pressure chronic ulcer of other part of left lower leg with fat layer exposed4/09/2022 No Yes L97.812 Non-pressure chronic ulcer of other part of right lower leg with fat layer 08/19/2022 No Yes exposed F17.218 Nicotine dependence, cigarettes, with other nicotine-induced disorders 08/19/2022 No Yes Inactive Problems Resolved Problems Electronic Signature(s) Signed: 08/19/2022 9:44:09 AM By: Lenda Kelp PA-C Entered By: Lenda Kelp on 08/19/2022 09:44:09 [name] ([id number]) [id number] Page 4 of 8 -------------------------------------------------------------------------------- Progress Note Details Patient Name: Date of Service: [name], [name] 08/19/2022 8:30 A M Medical Record Number: [id number] Patient Account Number: [id number] Date of Birth/Sex: Treating RN: [birth year] (32 y.o. Barnett Abu, Leah Primary Care Provider: PA Zenovia Jordan, [place] Other Clinician: Referring Provider: Treating Provider/Extender: Metta Clines in Treatment: 0 Subjective Chief Complaint Information obtained from Patient Bilateral LE Ulcers History of Present Illness (HPI) 08-19-2022 upon evaluation today patient presents for evaluation here in the clinic secondary to issues that has been having with wounds over the bilateral lower extremities. Subsequently the right leg posteriorly actually looks worse than the left although he tells me left leg hurts worse. Fortunately I do not see any signs of systemic infection though locally I definitely believe there is some cellulitis of this right leg posteriorly. He tells me he has been on antibiotics but is currently not on anything at this point. He is allergic to Keflex. With that being said he is having some discomfort bilaterally in his lower extremities. The patient does have a history of being overweight. Otherwise he also does smoke cigarettes he is also dealing with chronic lower extremity swelling. He does have a job where he stands most of the day. He is not diabetic. Currently he has been using Ace wrap. Patient History Information obtained from Patient. Allergies Keflex (Severity: Moderate, Reaction: hives, trouble breathing) Family History Cancer, Heart Disease, No family history of Diabetes, Hereditary Spherocytosis, Hypertension, Kidney Disease, Lung Disease, Seizures, Stroke, Tuberculosis. Social History Current every day smoker - 1ppd, Marital Status - Single, Alcohol Use - Never, Drug Use - Prior History. Medical History Respiratory Patient has history of Sleep Apnea Gastrointestinal Patient has history of Hepatitis C Psychiatric Patient has history of Confinement Anxiety Hospitalization/Surgery History - bowel resection 2015. Medical A Surgical History Notes nd Gastrointestinal diverticulitis; bowel resection Psychiatric bipolar (unmedicated) Review of Systems (ROS) Eyes Complains or has symptoms of Glasses /  Contacts. Ear/Nose/Mouth/Throat Complains or has symptoms of Sinusitis - seasonal and cat allergies. Hematologic/Lymphatic Denies complaints or symptoms of Bleeding / Clotting Disorders, Human Immunodeficiency Virus. Cardiovascular Complains or has symptoms of LE edema. Denies complaints or symptoms of Chest pain. Endocrine Denies complaints or symptoms of Hepatitis, Thyroid disease, Polydypsia (Excessive Thirst). Genitourinary Denies complaints or symptoms of Kidney failure/ Dialysis, Incontinence/dribbling. Immunological Denies complaints or symptoms of Hives, Itching. Integumentary (Skin) Complains or has symptoms of Wounds - lower extremities, Swelling. Musculoskeletal Denies complaints or symptoms of Muscle Pain, Muscle Weakness. Neurologic Denies complaints or symptoms of Numbness/parasthesias, Focal/Weakness. Psychiatric Complains or has symptoms of Anxiety. [name], [name] ([id number]) [id number] Page 5 of 8 Objective Constitutional patient is hypertensive.. pulse regular and within target range for patient.Marland Kitchen  respirations regular, non-labored and within target range for patient.Marland Kitchen. temperature within target range for patient.. Obese and well-hydrated in no acute distress. Vitals Time Taken: 8:52 AM, Height: 73 in, Source: Stated, Weight: 321 lbs, Source: Stated, BMI: 42.3, Temperature: 98.1 F, Pulse: 105 bpm, Respiratory Rate: 18 breaths/min, Blood Pressure: 140/84 mmHg. Eyes conjunctiva clear no eyelid edema noted. pupils equal round and reactive to light and accommodation. Ears, Nose, Mouth, and Throat no gross abnormality of ear auricles or external auditory canals. normal hearing noted during conversation. mucus membranes moist. Respiratory normal breathing without difficulty. Cardiovascular 2+ dorsalis pedis/posterior tibialis pulses. 2+ pitting edema of the bilateral lower extremities. Musculoskeletal normal gait and posture. no  significant deformity or arthritic changes, no loss or range of motion, no clubbing. Psychiatric this patient is able to make decisions and demonstrates good insight into disease process. Alert and Oriented x 3. pleasant and cooperative. General Notes: Upon inspection patient's wound bed actually showed signs of poor granulation and a lot of inflammation noted secondary to what appears to be cellulitis of the bilateral lower extremities. There was much more swelling however on the right there also is a lot more erythema that is concerning here for me to be perfectly honest. I do believe that the patient is tolerating the dressing changes with the Ace wrap at this point but I do not think that this is keeping the edema under good control at this point. I think he needs something more consistent he really needs compression wraps but in the interim right now I think Tubigrip is probably can to be best. Integumentary (Hair, Skin) Wound #1 status is Open. Original cause of wound was Trauma. The date acquired was: 09/09/2021. The wound is located on the Left,Anterior Lower Leg. The wound measures 1cm length x 1cm width x 0.2cm depth; 0.785cm^2 area and 0.157cm^3 volume. There is Fat Layer (Subcutaneous Tissue) exposed. There is no tunneling or undermining noted. There is a small amount of serous drainage noted. The wound margin is distinct with the outline attached to the wound base. There is large (67-100%) red granulation within the wound bed. There is a small (1-33%) amount of necrotic tissue within the wound bed including Adherent Slough. Wound #2 status is Open. Original cause of wound was Not Known. The date acquired was: 09/09/2021. The wound is located on the Left,Posterior Lower Leg. The wound measures 2cm length x 1cm width x 0.2cm depth; 1.571cm^2 area and 0.314cm^3 volume. There is Fat Layer (Subcutaneous Tissue) exposed. There is no tunneling or undermining noted. There is a small amount of  serous drainage noted. The wound margin is distinct with the outline attached to the wound base. There is large (67-100%) red granulation within the wound bed. There is a small (1-33%) amount of necrotic tissue within the wound bed including Adherent Slough. Wound #3 status is Open. Original cause of wound was Gradually Appeared. The date acquired was: 04/29/2022. The wound is located on the Right,Posterior Lower Leg. The wound measures 1cm length x 2.5cm width x 0.1cm depth; 1.963cm^2 area and 0.196cm^3 volume. The wound is limited to skin breakdown. There is no tunneling or undermining noted. There is a none present amount of drainage noted. The wound margin is distinct with the outline attached to the wound base. There is no granulation within the wound bed. There is no necrotic tissue within the wound bed. Assessment Active Problems ICD-10 Chronic venous hypertension (idiopathic) with ulcer and inflammation of bilateral lower extremity Non-pressure chronic ulcer of other  part of left lower leg with fat layer exposed Non-pressure chronic ulcer of other part of right lower leg with fat layer exposed Nicotine dependence, cigarettes, with other nicotine-induced disorders Plan Follow-up Appointments: Return Appointment in 1 week. The following medication(s) was prescribed: Bactrim DS oral 800 mg-160 mg tablet 1 1 tablet oral twice a day x 14 days starting 08/19/2022 WOUND #1: - Lower Leg Wound Laterality: Left, Anterior Cleanser: Soap and Water 3 x Per Week/15 Days Discharge Instructions: Gently cleanse wound with antibacterial soap, rinse and pat dry prior to dressing wounds Prim Dressing: Silvercel Small 2x2 (in/in) 3 x Per Week/15 Days ary Discharge Instructions: Apply Silvercel Small 2x2 (in/in) as instructed Secondary Dressing: Gauze 3 x Per Week/15 Days Discharge Instructions: As directed: dry, moistened with saline or moistened with Dakins Solution Secured With: Tubigrip Size E, 3.5x10  (in/yds) 3 x Per Week/15 Days Discharge Instructions: Apply 3 Tubigrip E 3-finger-widths below knee to base of toes to secure dressing and/or for swelling. WOUND #2: - Lower Leg Wound Laterality: Left, Posterior Cleanser: Soap and Water 3 x Per [address]Week/15 Days John MaxwellMITCHELL, Heath G ([id number]) [id number] Page 6 of 8 Discharge Instructions: Gently cleanse wound with antibacterial soap, rinse and pat dry prior to dressing wounds Prim Dressing: Silvercel Small 2x2 (in/in) 3 x Per Week/15 Days ary Discharge Instructions: Apply Silvercel Small 2x2 (in/in) as instructed Secondary Dressing: Gauze 3 x Per Week/15 Days Discharge Instructions: As directed: dry, moistened with saline or moistened with Dakins Solution Secured With: Tubigrip Size E, 3.5x10 (in/yds) 3 x Per Week/15 Days Discharge Instructions: Apply 3 Tubigrip E 3-finger-widths below knee to base of toes to secure dressing and/or for swelling. WOUND #3: - Lower Leg Wound Laterality: Right, Posterior Cleanser: Soap and Water 3 x Per Week/15 Days Discharge Instructions: Gently cleanse wound with antibacterial soap, rinse and pat dry prior to dressing wounds Prim Dressing: Silvercel Small 2x2 (in/in) 3 x Per Week/15 Days ary Discharge Instructions: Apply Silvercel Small 2x2 (in/in) as instructed Secondary Dressing: Gauze 3 x Per Week/15 Days Discharge Instructions: As directed: dry, moistened with saline or moistened with Dakins Solution Secured With: Tubigrip Size E, 3.5x10 (in/yds) 3 x Per Week/15 Days Discharge Instructions: Apply 3 Tubigrip E 3-finger-widths below knee to base of toes to secure dressing and/or for swelling. 1. Based on what I am seeing I am going to recommend that we go ahead and send in a prescription for the patient. I am going to suggest that we send in Bactrim he tells me this helped when he got it from the hospital he just did not have a long enough. Will get a see how things look next week. 2.  Unfortunately there really was not anything for me to be able to culture therefore I did not perform this today. 3. Will get initiate treatment with a silver alginate dressing to the open wounds followed by a gauze and then Tubigrip to secure in place and help with some of the edema. 4. I am then recommend as well that he should continue to elevate his legs much as possible to try to help with edema control. 5. I did also have a roughly 5-minute conversation with the patient regarding smoking cessation. In fact I printed off the information for the 1 800 quit now website which includes numbers for him to both call, text, or even visit the website to look at resources to help with smoking cessation. I explained the benefits of stopping and the detriment of continuing especially as  young as he is. The patient voiced understanding he did not make any commitment to quit right now but we will continue to monitor and if he wants any help I will be happy to do so. We will see patient back for reevaluation in 1 week here in the clinic. If anything worsens or changes patient will contact our office for additional recommendations. Patient was advised that if he develops any fevers, chills, nausea, vomiting, or diarrhea that he should go to the ER ASAP and the individual with him was told that if she notices him acting out of the ordinary were not severely confused that she should call EMS to get him to the hospital as quickly as possible. Electronic Signature(s) Signed: 08/19/2022 1:30:31 PM By: Lenda Kelp PA-C Previous Signature: 08/19/2022 1:29:12 PM Version By: Lenda Kelp PA-C Entered By: Lenda Kelp on 08/19/2022 13:30:31 -------------------------------------------------------------------------------- ROS/PFSH Details Patient Name: Date of Service: [name] 08/19/2022 8:30 A M Medical Record Number: [id number] Patient Account Number: [id number] Date of Birth/Sex: Treating  RN: Nov 06, 1990 (32 y.o. Barnett Abu, Leah Primary Care Provider: PA Zenovia Jordan, [place] Other Clinician: Referring Provider: Treating Provider/Extender: Metta Clines in Treatment: 0 Information Obtained From Patient Eyes Complaints and Symptoms: Positive for: Glasses / Contacts Ear/Nose/Mouth/Throat Complaints and Symptoms: Positive for: Sinusitis - seasonal and cat allergies Hematologic/Lymphatic Complaints and Symptoms: Negative for: Bleeding / Clotting Disorders; Human Immunodeficiency Virus Cardiovascular Complaints and Symptoms: Positive for: LE edema Negative for: Chest pain Endocrine Complaints and Symptoms: Negative for: Hepatitis; Thyroid disease; Polydypsia (Excessive Thirst) [name], [name] ([id number]) [id number] Page 7 of 8 Genitourinary Complaints and Symptoms: Negative for: Kidney failure/ Dialysis; Incontinence/dribbling Immunological Complaints and Symptoms: Negative for: Hives; Itching Integumentary (Skin) Complaints and Symptoms: Positive for: Wounds - lower extremities; Swelling Musculoskeletal Complaints and Symptoms: Negative for: Muscle Pain; Muscle Weakness Neurologic Complaints and Symptoms: Negative for: Numbness/parasthesias; Focal/Weakness Psychiatric Complaints and Symptoms: Positive for: Anxiety Medical History: Positive for: Confinement Anxiety Past Medical History Notes: bipolar (unmedicated) Respiratory Medical History: Positive for: Sleep Apnea Gastrointestinal Medical History: Positive for: Hepatitis C Past Medical History Notes: diverticulitis; bowel resection Oncologic Immunizations Pneumococcal Vaccine: Received Pneumococcal Vaccination: No Implantable Devices None Hospitalization / Surgery History Type of Hospitalization/Surgery bowel resection 2015 Family and Social History Cancer: Yes; Diabetes: No; Heart Disease: Yes; Hereditary Spherocytosis: No; Hypertension: No; Kidney  Disease: No; Lung Disease: No; Seizures: No; Stroke: No; Tuberculosis: No; Current every day smoker - 1ppd; Marital Status - Single; Alcohol Use: Never; Drug Use: Prior History; Financial Concerns: No; Food, Clothing or Shelter Needs: No; Support System Lacking: No; Transportation Concerns: No Electronic Signature(s) Signed: 08/19/2022 11:20:35 AM By: Bonnell Public Signed: 08/19/2022 12:23:32 PM By: Lenda Kelp PA-C Entered By: Bonnell Public on 08/19/2022 09:26:04 [name] ([id number]) [id number] Page 8 of 8 -------------------------------------------------------------------------------- SuperBill Details Patient Name: Date of Service: [name], [name] 08/19/2022 Medical Record Number: [id number] Patient Account Number: [id number] Date of Birth/Sex: Treating RN: 1990/11/25 (32 y.o. Barnett Abu, Leah Primary Care Provider: PA TIENT, [place] Other Clinician: Referring Provider: Treating Provider/Extender: Metta Clines in Treatment: 0 Diagnosis Coding ICD-10 Codes Code Description [phone] Chronic venous hypertension (idiopathic) with ulcer and inflammation of bilateral lower extremity L97.822 Non-pressure chronic ulcer of other part of left lower leg with fat layer exposed L97.812 Non-pressure chronic ulcer of other part of right lower leg with fat layer exposed F17.218 Nicotine dependence, cigarettes, with other nicotine-induced disorders Facility Procedures : CPT4 Code: 41324401 Description:  96045 - WOUND CARE VISIT-LEV 4 EST PT Modifier: Quantity: 1 : CPT4 Code: 40981191 Description: 99406-SMOKING CESSATION 3-10MINS ICD-10 Diagnosis Description F17.218 Nicotine dependence, cigarettes, with other nicotine-induced disorders Modifier: Quantity: 1 Physician Procedures : CPT4 Code Description Modifier [id number] 99204 - WC PHYS LEVEL 4 - NEW PT 25 ICD-10 Diagnosis Description I87.333 Chronic venous hypertension (idiopathic) with ulcer and  inflammation of bilateral lower extremity L97.822 Non-pressure chronic ulcer of  other part of left lower leg with fat layer exposed L97.812 Non-pressure chronic ulcer of other part of right lower leg with fat layer exposed F17.218 Nicotine dependence, cigarettes, with other nicotine-induced disorders Quantity: 1 : 99406 99406- SMOKING CESSATION 3-10 MINS ICD-10 Diagnosis Description F17.218 Nicotine dependence, cigarettes, with other nicotine-induced disorders Quantity: 1 Electronic Signature(s) Signed: 08/19/2022 1:29:37 PM By: Lenda Kelp PA-C Previous Signature: 08/19/2022 11:20:35 AM Version By: Bonnell Public Previous Signature: 08/19/2022 12:23:32 PM Version By: Lenda Kelp PA-C Entered By: Lenda Kelp on 08/19/2022 13:29:37

## 2022-08-26 ENCOUNTER — Ambulatory Visit: Payer: Medicaid Other | Admitting: Physician Assistant

## 2022-11-04 ENCOUNTER — Emergency Department (HOSPITAL_COMMUNITY)
Admission: EM | Admit: 2022-11-04 | Discharge: 2022-11-05 | Disposition: A | Discharge status: Home / Self Care | Payer: Medicaid Other | Attending: Emergency Medicine | Admitting: Emergency Medicine

## 2022-11-04 DIAGNOSIS — R600 Localized edema: Secondary | ICD-10-CM | POA: Diagnosis not present

## 2022-11-04 DIAGNOSIS — R2243 Localized swelling, mass and lump, lower limb, bilateral: Secondary | ICD-10-CM | POA: Diagnosis present

## 2022-11-04 NOTE — ED Triage Notes (Signed)
Patient here from home reporting bilateral leg swelling  with left leg being worse. Painful walking, oozing. Hx of same.

## 2022-11-05 MED ORDER — DOXYCYCLINE HYCLATE 100 MG PO CAPS: 100.0000 mg | ORAL_CAPSULE | Freq: Two times a day (BID) | ORAL | 0 refills | Status: DC

## 2022-11-05 MED ORDER — DOXYCYCLINE HYCLATE 100 MG PO TABS
100.0000 mg | ORAL_TABLET | Freq: Once | ORAL | Status: AC
  Administered 2022-11-05: 100 mg via ORAL
  Filled 2022-11-05: qty 1

## 2022-11-05 MED ORDER — FUROSEMIDE 20 MG PO TABS: 20.0000 mg | ORAL_TABLET | Freq: Every day | ORAL | 0 refills | Status: DC

## 2022-11-05 MED ORDER — FUROSEMIDE 10 MG/ML IJ SOLN
20.0000 mg | Freq: Once | INTRAMUSCULAR | Status: AC
  Administered 2022-11-05: 20 mg via INTRAVENOUS
  Filled 2022-11-05: qty 4

## 2022-11-05 NOTE — Discharge Instructions (Addendum)
Try to elevate your legs above the level of your heart whenever you are not up and moving around.  Follow-up with your family doctor in the office.

## 2022-11-05 NOTE — ED Provider Notes (Signed)
[place] EMERGENCY DEPARTMENT AT [facility] Provider Note   CSN: [id number] Arrival date & time: 11/04/22  2300     History  Chief Complaint  Patient presents with   Leg Swelling    [name] is a 32 y.o. male.  32 yo M with a cc of bilateral leg swelling.  He is worried that his left leg may be infected.  He has had problems with this in the past.  He is now too worried about the leg swelling but more of the redness and pain to the leg.  Has been going on for a few days now.  Typically gets better with antibiotics.        Home Medications Prior to Admission medications   Medication Sig Start Date End Date Taking? Authorizing Provider  doxycycline (VIBRAMYCIN) 100 MG capsule Take 1 capsule (100 mg total) by mouth 2 (two) times daily. One po bid x 7 days 11/05/22  Yes Melene Plan, DO  furosemide (LASIX) 20 MG tablet Take 1 tablet (20 mg total) by mouth daily. 11/05/22  Yes Melene Plan, DO  Glecaprevir-Pibrentasvir (MAVYRET) 100-40 MG TABS Take 3 tablets by mouth daily with breakfast. 08/28/18   Kuppelweiser, Cassie L, RPH-CPP  potassium chloride SA (KLOR-CON M) 20 MEQ tablet Take 1 tablet (20 mEq total) by mouth daily. 12/22/21   Tilden Fossa, MD      Allergies    Cephalexin    Review of Systems   Review of Systems  Physical Exam Updated Vital Signs BP (!) 156/100 (BP Location: Left Arm)   Pulse (!) 115   Temp 98 F (36.7 C) (Oral)   Resp 19   Ht 6\' 1"  (1.854 m)   Wt (!) 145.2 kg   SpO2 100%   BMI 42.22 kg/m  Physical Exam Vitals and nursing note reviewed.  Constitutional:      Appearance: He is well-developed.  HENT:     Head: Normocephalic and atraumatic.  Eyes:     Pupils: Pupils are equal, round, and reactive to light.  Neck:     Vascular: No JVD.  Cardiovascular:     Rate and Rhythm: Normal rate and regular rhythm.     Heart sounds: No murmur heard.    No friction rub. No gallop.  Pulmonary:     Effort: No respiratory distress.      Breath sounds: No wheezing.  Abdominal:     General: There is no distension.     Tenderness: There is no abdominal tenderness. There is no guarding or rebound.  Musculoskeletal:        General: Normal range of motion.     Cervical back: Normal range of motion and neck supple.     Right lower leg: Edema present.     Left lower leg: Edema present.     Comments: Bilateral lower extremity edema 2+ up to the thighs.  He has some erythema and skin breakdown bilaterally worse on the left than the right.  Skin:    Coloration: Skin is not pale.     Findings: No rash.  Neurological:     Mental Status: He is alert and oriented to person, place, and time.  Psychiatric:        Behavior: Behavior normal.     ED Results / Procedures / Treatments   Labs (all labs ordered are listed, but only abnormal results are displayed) Labs Reviewed - No data to display  EKG None  Radiology No results found.  Procedures Procedures    Medications Ordered in ED Medications  doxycycline (VIBRA-TABS) tablet 100 mg (has no administration in time range)  furosemide (LASIX) injection 20 mg (has no administration in time range)    ED Course/ Medical Decision Making/ A&P                             Medical Decision Making Risk Prescription drug management.   32 yo M with a chief complaints of bilateral leg swelling.  This has been going on for some time.  Worse over the past few days.  On my exam the patient has likely stasis dermatitis bilaterally.  He does have some skin breakdown on the left and he does have some more warmth compared to the other side.  Will treat with a course of antibiotics.  I did encourage him to try and elevate his legs at home.  Encouraged him to follow-up with his family doctor in the office.  12:22 AM:  I have discussed the diagnosis/risks/treatment options with the patient.  Evaluation and diagnostic testing in the emergency department does not suggest an emergent  condition requiring admission or immediate intervention beyond what has been performed at this time.  They will follow up with PCP. We also discussed returning to the ED immediately if new or worsening sx occur. We discussed the sx which are most concerning (e.g., sudden worsening pain, fever, inability to tolerate by mouth) that necessitate immediate return. Medications administered to the patient during their visit and any new prescriptions provided to the patient are listed below.  Medications given during this visit Medications  doxycycline (VIBRA-TABS) tablet 100 mg (has no administration in time range)  furosemide (LASIX) injection 20 mg (has no administration in time range)     The patient appears reasonably screen and/or stabilized for discharge and I doubt any other medical condition or other [facility] requiring further screening, evaluation, or treatment in the ED at this time prior to discharge.          Final Clinical Impression(s) / ED Diagnoses Final diagnoses:  Bilateral lower extremity edema    Rx / DC Orders ED Discharge Orders          Ordered    furosemide (LASIX) 20 MG tablet  Daily        11/05/22 0017    doxycycline (VIBRAMYCIN) 100 MG capsule  2 times daily        11/05/22 0018              Melene Plan, DO 11/05/22 0022

## 2024-02-28 ENCOUNTER — Ambulatory Visit: Payer: MEDICAID

## 2024-02-28 VITALS — BP 132/84 | HR 79 | Temp 98.2°F | Ht 73.0 in | Wt 336.1 lb

## 2024-02-28 DIAGNOSIS — G4730 Sleep apnea, unspecified: Secondary | ICD-10-CM

## 2024-02-28 DIAGNOSIS — B182 Chronic viral hepatitis C: Secondary | ICD-10-CM

## 2024-02-28 DIAGNOSIS — Z0000 Encounter for general adult medical examination without abnormal findings: Secondary | ICD-10-CM | POA: Diagnosis not present

## 2024-02-28 DIAGNOSIS — R29818 Other symptoms and signs involving the nervous system: Secondary | ICD-10-CM | POA: Diagnosis not present

## 2024-02-28 DIAGNOSIS — Z23 Encounter for immunization: Secondary | ICD-10-CM

## 2024-02-28 DIAGNOSIS — F1111 Opioid abuse, in remission: Secondary | ICD-10-CM | POA: Diagnosis not present

## 2024-02-28 DIAGNOSIS — Z6841 Body Mass Index (BMI) 40.0 and over, adult: Secondary | ICD-10-CM | POA: Insufficient documentation

## 2024-02-28 DIAGNOSIS — K739 Chronic hepatitis, unspecified: Secondary | ICD-10-CM

## 2024-02-28 NOTE — Patient Instructions (Signed)
 VISIT SUMMARY: Today, we discussed several health concerns and made plans for further evaluation and management. You are doing well overall, and we have outlined steps to address your sleep, weight, hepatitis C monitoring, and tobacco use.  YOUR PLAN: SUSPECTED SLEEP APNEA: You have symptoms that suggest sleep apnea, such as waking up gasping for air and daytime fatigue. -We will order a home sleep study to evaluate your symptoms. -Please schedule a follow-up appointment in 8 weeks to discuss the results and potential treatment options.  OBESITY AND WEIGHT MANAGEMENT: You are interested in weight loss treatments and have a family history of successful weight loss interventions. -Please call your insurance company and ask to speak to the benefits department to inquire about coverage for GLP-1 injections, specifically Wegovy and Zepbound. -We will discuss weight loss options after we have the results of your sleep study.  CHRONIC HEPATITIS C: You have a history of hepatitis C, which is being managed by [facility]. -We will order blood tests to check your hepatitis C status, including hep C antibody and quantitative RNA tests.  TOBACCO USE (SMOKELESS): You are currently using smokeless tobacco and have a history of cigarette smoking. -We discussed your current use of smokeless tobacco and its health risks.  GENERAL HEALTH MAINTENANCE: You are up to date on your screenings and we discussed optional vaccinations. -You received a flu shot today. -We discussed the availability of COVID-19 vaccinations at local pharmacies.  If you have any problems before your next visit feel free to message me via MyChart (minor issues or questions) or call the office, otherwise you may reach out to schedule an office visit.  Thank you! Saddie Sacks, PA-C

## 2024-02-28 NOTE — Assessment & Plan Note (Signed)
 Up to date on screenings.  - Administer flu shot today. - Discussed COVID-19 vaccination availability at pharmacies.  - Updating FBW today and will f/u with results

## 2024-02-28 NOTE — Assessment & Plan Note (Signed)
 Managed by [facility]. Plans to check hep C status with blood work. - Order hep C antibody and quantitative RNA tests.

## 2024-02-28 NOTE — Assessment & Plan Note (Signed)
 Insurance coverage for GLP-1 injections uncertain under sleep apnea diagnosis. - Advise calling insurance for coverage inquiry on GLP-1 injections, specifically Wegovy and Zepbound. - Discuss weight loss options post-sleep study results.

## 2024-02-28 NOTE — Progress Notes (Signed)
 New Patient Office Visit  Subjective    Patient ID: [name], male    DOB: 1991-03-20  Age: 33 y.o. MRN: [id number]  CC:  Chief Complaint  Patient presents with   New Patient (Initial Visit)   History of Present Illness   [name] is a 33 year old male who presents for an establishment of care visit.  Colonic resection and gastrointestinal symptoms - History of diverticulitis with subsequent colectomy, resulting in approximately half of colon remaining - No ongoing gastrointestinal symptoms - No current gastroenterology follow-up  Opioid use for chronic pain - Methadone 10 mg daily for the past 1.5 years - Medication provided by [facility] pain [facility]  Neuropsychiatric symptoms - History of ADHD, anxiety, and depression - Not currently taking medications for these conditions - No current symptoms of ADHD, anxiety, or depression  Sleep-disordered breathing - Symptoms of sleep apnea, including waking up gasping for air, nocturnal hiccuping, and daytime fatigue  Weight management - Interest in weight loss treatments - Family history of successful weight loss interventions on GLP-1 medications  Tobacco use - History of cigarette smoking, previously one pack of [place] daily for several years - Currently abstinent from cigarettes - Current use of smokeless tobacco  Hepatitis c monitoring - History of hepatitis C under management by [facility] - Due for blood work to monitor hepatitis C status  Screenings:  Colon Cancer: N/A Lung Cancer: N/A Breast Cancer: N/A Diabetes: Checking A1c with labs  HLD: Checking lipid panel with labs The ASCVD Risk score (Arnett DK, et al., 2019) failed to calculate for the following reasons:   The 2019 ASCVD risk score is only valid for ages 29 to 28   Outpatient Encounter Medications as of 02/28/2024  Medication Sig   methadone (DOLOPHINE) 10 MG tablet Take 10 mg by mouth daily.   [DISCONTINUED] doxycycline   (VIBRAMYCIN ) 100 MG capsule Take 1 capsule (100 mg total) by mouth 2 (two) times daily. One po bid x 7 days   [DISCONTINUED] furosemide  (LASIX ) 20 MG tablet Take 1 tablet (20 mg total) by mouth daily.   [DISCONTINUED] Glecaprevir -Pibrentasvir  (MAVYRET ) 100-40 MG TABS Take 3 tablets by mouth daily with breakfast.   [DISCONTINUED] potassium chloride  SA (KLOR-CON  M) 20 MEQ tablet Take 1 tablet (20 mEq total) by mouth daily.   No facility-administered encounter medications on file as of 02/28/2024.    Past Medical History:  Diagnosis Date   Accidental heroin overdose (HCC)    Anxiety    Attention deficit disorder    Depression    Diverticulitis    Hiccups    pt states hiccups during sleep     Past Surgical History:  Procedure Laterality Date   colonscopy      removed polyps    LAPAROSCOPIC PARTIAL COLECTOMY N/A 07/01/2014   Procedure: LAPAROSCOPIC ASSISTED SIGMOID COLECTOMY WITH MOBILIZATION OF SPLENIC FLEXURE;  Surgeon: Krystal Russell, MD;  Location: [facility];  Service: General;  Laterality: N/A;    Family History  Problem Relation Age of Onset   Neuropathy Mother    Sleep apnea Mother    Lung cancer Maternal Grandmother    Alcoholism Maternal Grandfather    Alcoholism Paternal Grandfather     Social History   Socioeconomic History   Marital status: Single    Spouse name: Not on file   Number of children: Not on file   Years of education: Not on file   Highest education level: Not on file  Occupational History  Not on file  Tobacco Use   Smoking status: Former    Types: Cigarettes    Start date: 08/15/2003   Smokeless tobacco: Former    Types: Snuff  Vaping Use   Vaping status: Every Day  Substance and Sexual Activity   Alcohol use: Yes    Comment: occasionally   Drug use: Not Currently    Types: Crack cocaine, Heroin, Methamphetamines, Marijuana, Cocaine    Comment: clean since 11/30/15   Sexual activity: Yes    Birth control/protection: Condom  Other Topics  Concern   Not on file  Social History Narrative   Not on file   Social Drivers of Health   Financial Resource Strain: Not on file  Food Insecurity: No Food Insecurity (02/28/2024)   Hunger Vital Sign    Worried About Running Out of Food in the Last Year: Never true    Ran Out of Food in the Last Year: Never true  Transportation Needs: No Transportation Needs (02/28/2024)   PRAPARE - Administrator, Civil Service (Medical): No    Lack of Transportation (Non-Medical): No  Physical Activity: Not on file  Stress: Not on file  Social Connections: Not on file  Intimate Partner Violence: Not At Risk (02/28/2024)   Humiliation, Afraid, Rape, and Kick questionnaire    Fear of Current or Ex-Partner: No    Emotionally Abused: No    Physically Abused: No    Sexually Abused: No    ROS  Per HPI      Objective    BP 132/84   Pulse 79   Temp 98.2 F (36.8 C) (Oral)   Ht 6' 1 (1.854 m)   Wt (!) 336 lb 1.3 oz (152.4 kg)   SpO2 96%   BMI 44.34 kg/m   Physical Exam Constitutional:      General: He is not in acute distress.    Appearance: Normal appearance.  Cardiovascular:     Rate and Rhythm: Normal rate and regular rhythm.     Heart sounds: Normal heart sounds. No murmur heard.    No friction rub. No gallop.  Pulmonary:     Effort: Pulmonary effort is normal. No respiratory distress.     Breath sounds: Normal breath sounds.  Musculoskeletal:        General: No swelling.  Skin:    General: Skin is warm and dry.  Neurological:     General: No focal deficit present.     Mental Status: He is alert.  Psychiatric:        Mood and Affect: Mood normal.        Behavior: Behavior normal.        Thought Content: Thought content normal.         Assessment & Plan:   Encounter for vaccination -     Flu vaccine trivalent PF, 6mos and older(Flulaval,Afluria,Fluarix,Fluzone)  Observed sleep apnea -     Home sleep test  BMI 40.0-44.9, adult [facility]) Assessment  & Plan: Insurance coverage for GLP-1 injections uncertain under sleep apnea diagnosis. - Advise calling insurance for coverage inquiry on GLP-1 injections, specifically Wegovy and Zepbound. - Discuss weight loss options post-sleep study results.   Orders: -     VITAMIN D 25 Hydroxy (Vit-D Deficiency, Fractures); Future -     TSH; Future -     Hemoglobin A1c; Future -     Lipid panel; Future -     Comprehensive metabolic panel with GFR; Future -  CBC with Differential/Platelet; Future  Chronic hepatitis (HCC) -     HCV RNA quant; Future  Chronic hepatitis C without hepatic coma (HCC) Assessment & Plan: Managed by [facility]. Plans to check hep C status with blood work. - Order hep C antibody and quantitative RNA tests.   Mild opioid abuse in early remission in controlled environment [facility]) Assessment & Plan: Follows with [facility] in [place]. They prescribe and manage his methadone 10 mg daily.   Suspected sleep apnea Assessment & Plan: Symptoms suggestive of sleep apnea. - Order home sleep study through [facility] - Schedule follow-up in 8 weeks to discuss results and treatment options.   Healthcare maintenance Assessment & Plan: Up to date on screenings.  - Administer flu shot today. - Discussed COVID-19 vaccination availability at pharmacies.  - Updating FBW today and will f/u with results    Return in about 8 weeks (around 04/24/2024) for Discuss sleep study .   Saddie JULIANNA Sacks, PA-C

## 2024-02-28 NOTE — Assessment & Plan Note (Addendum)
 Follows with [facility] in [place]. They prescribe and manage his methadone 10 mg daily.

## 2024-02-28 NOTE — Assessment & Plan Note (Signed)
 Symptoms suggestive of sleep apnea. - Order home sleep study through [facility] - Schedule follow-up in 8 weeks to discuss results and treatment options.

## 2024-03-06 ENCOUNTER — Other Ambulatory Visit: Payer: MEDICAID

## 2024-03-06 DIAGNOSIS — Z6841 Body Mass Index (BMI) 40.0 and over, adult: Secondary | ICD-10-CM

## 2024-03-06 DIAGNOSIS — K739 Chronic hepatitis, unspecified: Secondary | ICD-10-CM

## 2024-03-07 ENCOUNTER — Ambulatory Visit: Payer: Self-pay

## 2024-03-07 ENCOUNTER — Telehealth: Payer: Self-pay

## 2024-03-07 NOTE — Telephone Encounter (Signed)
 Copied from CRM [id number]. Topic: Clinical - Request for Lab/Test Order >> Mar 06, 2024  4:57 PM Hadassah PARAS wrote: Reason for CRM: The location of where the  sleep apnea was ordered is not being covered by insurance. Pt would like to change to someone who does cover exam. Please advise #[phone]

## 2024-03-08 LAB — CBC WITH DIFFERENTIAL/PLATELET
Basophils Absolute: 0 x10E3/uL (ref 0.0–0.2)
Basos: 1 %
EOS (ABSOLUTE): 0.2 x10E3/uL (ref 0.0–0.4)
Eos: 4 %
Hematocrit: 44.6 % (ref 37.5–51.0)
Hemoglobin: 14.8 g/dL (ref 13.0–17.7)
Immature Grans (Abs): 0 x10E3/uL (ref 0.0–0.1)
Immature Granulocytes: 0 %
Lymphocytes Absolute: 1.7 x10E3/uL (ref 0.7–3.1)
Lymphs: 40 %
MCH: 28.3 pg (ref 26.6–33.0)
MCHC: 33.2 g/dL (ref 31.5–35.7)
MCV: 85 fL (ref 79–97)
Monocytes Absolute: 0.3 x10E3/uL (ref 0.1–0.9)
Monocytes: 8 %
Neutrophils Absolute: 2 x10E3/uL (ref 1.4–7.0)
Neutrophils: 47 %
Platelets: 187 x10E3/uL (ref 150–450)
RBC: 5.23 x10E6/uL (ref 4.14–5.80)
RDW: 12.9 % (ref 11.6–15.4)
WBC: 4.3 x10E3/uL (ref 3.4–10.8)

## 2024-03-08 LAB — HEMOGLOBIN A1C
Est. average glucose Bld gHb Est-mCnc: 103 mg/dL
Hgb A1c MFr Bld: 5.2 % (ref 4.8–5.6)

## 2024-03-08 LAB — COMPREHENSIVE METABOLIC PANEL WITH GFR
ALT: 40 IU/L (ref 0–44)
AST: 32 IU/L (ref 0–40)
Albumin: 4.5 g/dL (ref 4.1–5.1)
Alkaline Phosphatase: 89 IU/L (ref 47–123)
BUN/Creatinine Ratio: 10 (ref 9–20)
BUN: 11 mg/dL (ref 6–20)
Bilirubin Total: 0.6 mg/dL (ref 0.0–1.2)
CO2: 26 mmol/L (ref 20–29)
Calcium: 9 mg/dL (ref 8.7–10.2)
Chloride: 98 mmol/L (ref 96–106)
Creatinine, Ser: 1.11 mg/dL (ref 0.76–1.27)
Globulin, Total: 2.6 g/dL (ref 1.5–4.5)
Glucose: 96 mg/dL (ref 70–99)
Potassium: 4.1 mmol/L (ref 3.5–5.2)
Sodium: 138 mmol/L (ref 134–144)
Total Protein: 7.1 g/dL (ref 6.0–8.5)
eGFR: 89 mL/min/1.73 (ref >59)

## 2024-03-08 LAB — LIPID PANEL
Chol/HDL Ratio: 3.6 ratio (ref 0.0–5.0)
Cholesterol, Total: 160 mg/dL (ref 100–199)
HDL: 45 mg/dL (ref >39)
LDL Chol Calc (NIH): 92 mg/dL (ref 0–99)
Triglycerides: 127 mg/dL (ref 0–149)
VLDL Cholesterol Cal: 23 mg/dL (ref 5–40)

## 2024-03-08 LAB — HCV RNA QUANT
HCV log10: 6.722 {Log_IU}/mL
Hepatitis C Quantitation: 5270000 [IU]/mL

## 2024-03-08 LAB — VITAMIN D 25 HYDROXY (VIT D DEFICIENCY, FRACTURES): Vit D, 25-Hydroxy: 35.3 ng/mL (ref 30.0–100.0)

## 2024-03-08 LAB — TSH: TSH: 2.35 u[IU]/mL (ref 0.450–4.500)

## 2024-03-10 ENCOUNTER — Other Ambulatory Visit: Payer: Self-pay

## 2024-03-10 DIAGNOSIS — R29818 Other symptoms and signs involving the nervous system: Secondary | ICD-10-CM

## 2024-03-10 NOTE — Telephone Encounter (Signed)
 I have changed this referral to an in-person sleep study since that is what his insurance prefers. The sleep clinic will call him to get the appointment set up.  Let me know if there are any issues with this.

## 2024-03-11 NOTE — Telephone Encounter (Signed)
 Spoke with patient/ related message from provider. Patient has been made aware of labwork results.

## 2024-03-11 NOTE — Telephone Encounter (Signed)
 Called and spoke with patient. He's aware sleep study will be done in person/ clinic will contact patient with appointment date and time.

## 2024-03-28 ENCOUNTER — Telehealth: Payer: Self-pay

## 2024-03-28 NOTE — Telephone Encounter (Signed)
 I returned a call to the pt and  let him know that I called [facility] and they stated that he would need a consultation and the referral was marked do not schedule and was advised to order it for [facility] sleep lab.    I reached out to [facility] sleep lab and waiting on a return call.

## 2024-03-28 NOTE — Telephone Encounter (Unsigned)
 Copied from CRM #[id number]. Topic: Referral - Question >> Mar 28, 2024 11:57 AM Tiffini S wrote: Reason for CRM: Patient called with question about the referral dated: 03/10/24- said he was denied for a home sleep studies and need an update- was suppose to receive a call back   Please call the patient at [phone]

## 2024-04-25 ENCOUNTER — Ambulatory Visit: Payer: MEDICAID

## 2024-06-12 ENCOUNTER — Telehealth: Payer: Self-pay

## 2024-06-12 NOTE — Telephone Encounter (Deleted)
 Copied from CRM [id number]. Topic: Referral - Status >> Jun 12, 2024 12:26 PM Hadassah PARAS wrote: Reason for CRM: Pt is following up on this req. He is still awaiting call from [facility] regarding sleep study. Please advise pt w any updates on #[phone]

## 2024-06-12 NOTE — Telephone Encounter (Signed)
 Error

## 2024-06-12 NOTE — Addendum Note (Signed)
 Addended by: ZIMMERMAN RUMPLE, Virgie Chery D on: 06/12/2024 01:48 PM   Modules accepted: Orders

## 2024-06-12 NOTE — Telephone Encounter (Signed)
 Copied from CRM [id number]. Topic: Referral - Status >> Jun 12, 2024 12:26 PM Hadassah PARAS wrote: Reason for CRM: Pt is following up on this req. He is still awaiting call from [facility] regarding sleep study. Please advise pt w any updates on #[phone]    Spoke to pt to let him know that I just spoke to [facility] sleep lab and that they should be reaching out to him. We called to confirm that they received the order.
